# Patient Record
Sex: Female | Born: 1945
Health system: Southern US, Community
[De-identification: ages and names within clinical notes are randomized; demographics above are authoritative.]

## PROBLEM LIST (undated history)

## (undated) DIAGNOSIS — I10 Essential (primary) hypertension: Secondary | ICD-10-CM

## (undated) DIAGNOSIS — M1990 Unspecified osteoarthritis, unspecified site: Secondary | ICD-10-CM

## (undated) DIAGNOSIS — N6009 Solitary cyst of unspecified breast: Secondary | ICD-10-CM

## (undated) DIAGNOSIS — M169 Osteoarthritis of hip, unspecified: Secondary | ICD-10-CM

## (undated) DIAGNOSIS — M545 Low back pain, unspecified: Secondary | ICD-10-CM

## (undated) DIAGNOSIS — K219 Gastro-esophageal reflux disease without esophagitis: Secondary | ICD-10-CM

## (undated) DIAGNOSIS — K635 Polyp of colon: Secondary | ICD-10-CM

## (undated) HISTORY — PX: COLONOSCOPY: SHX174

## (undated) HISTORY — DX: Polyp of colon: K63.5

## (undated) HISTORY — DX: Unspecified osteoarthritis, unspecified site: M19.90

## (undated) HISTORY — PX: COLONOSCOPY W/ BIOPSIES AND POLYPECTOMY: SHX1376

## (undated) HISTORY — PX: POLYPECTOMY: SHX149

## (undated) HISTORY — DX: Solitary cyst of unspecified breast: N60.09

## (undated) HISTORY — DX: Osteoarthritis of hip, unspecified: M16.9

## (undated) HISTORY — DX: Essential (primary) hypertension: I10

## (undated) HISTORY — DX: Low back pain, unspecified: M54.50

## (undated) HISTORY — DX: Low back pain: M54.5

---

## 1998-10-19 ENCOUNTER — Other Ambulatory Visit: Admission: RE | Admit: 1998-10-19 | Discharge: 1998-10-19 | Payer: Self-pay | Admitting: Gynecology

## 1999-10-21 ENCOUNTER — Other Ambulatory Visit: Admission: RE | Admit: 1999-10-21 | Discharge: 1999-10-21 | Payer: Self-pay | Admitting: Gynecology

## 2000-08-10 ENCOUNTER — Encounter: Payer: Self-pay | Admitting: Internal Medicine

## 2000-08-10 ENCOUNTER — Ambulatory Visit (HOSPITAL_COMMUNITY): Admission: RE | Admit: 2000-08-10 | Discharge: 2000-08-10 | Payer: Self-pay | Admitting: Internal Medicine

## 2000-11-06 ENCOUNTER — Other Ambulatory Visit: Admission: RE | Admit: 2000-11-06 | Discharge: 2000-11-06 | Payer: Self-pay | Admitting: Gynecology

## 2001-11-13 ENCOUNTER — Other Ambulatory Visit: Admission: RE | Admit: 2001-11-13 | Discharge: 2001-11-13 | Payer: Self-pay | Admitting: Gynecology

## 2002-11-25 ENCOUNTER — Other Ambulatory Visit: Admission: RE | Admit: 2002-11-25 | Discharge: 2002-11-25 | Payer: Self-pay | Admitting: Gynecology

## 2003-12-09 ENCOUNTER — Other Ambulatory Visit: Admission: RE | Admit: 2003-12-09 | Discharge: 2003-12-09 | Payer: Self-pay | Admitting: Gynecology

## 2004-09-28 ENCOUNTER — Ambulatory Visit: Payer: Self-pay | Admitting: Internal Medicine

## 2004-10-05 ENCOUNTER — Ambulatory Visit: Payer: Self-pay | Admitting: Internal Medicine

## 2004-11-01 ENCOUNTER — Ambulatory Visit: Payer: Self-pay | Admitting: Gastroenterology

## 2004-11-15 ENCOUNTER — Ambulatory Visit: Payer: Self-pay | Admitting: Gastroenterology

## 2004-12-27 ENCOUNTER — Other Ambulatory Visit: Admission: RE | Admit: 2004-12-27 | Discharge: 2004-12-27 | Payer: Self-pay | Admitting: Gynecology

## 2005-10-10 ENCOUNTER — Ambulatory Visit: Payer: Self-pay | Admitting: Internal Medicine

## 2006-01-02 ENCOUNTER — Other Ambulatory Visit: Admission: RE | Admit: 2006-01-02 | Discharge: 2006-01-02 | Payer: Self-pay | Admitting: Gynecology

## 2006-09-26 DIAGNOSIS — N6009 Solitary cyst of unspecified breast: Secondary | ICD-10-CM

## 2006-09-26 HISTORY — DX: Solitary cyst of unspecified breast: N60.09

## 2006-10-09 ENCOUNTER — Ambulatory Visit: Payer: Self-pay | Admitting: Internal Medicine

## 2006-10-09 LAB — CONVERTED CEMR LAB
ALT: 31 units/L (ref 0–40)
AST: 26 units/L (ref 0–37)
Albumin: 4 g/dL (ref 3.5–5.2)
Alkaline Phosphatase: 62 units/L (ref 39–117)
BUN: 13 mg/dL (ref 6–23)
Basophils Absolute: 0.1 10*3/uL (ref 0.0–0.1)
Basophils Relative: 0.6 % (ref 0.0–1.0)
Bilirubin Urine: NEGATIVE
CO2: 30 meq/L (ref 19–32)
Calcium: 9.5 mg/dL (ref 8.4–10.5)
Chloride: 101 meq/L (ref 96–112)
Chol/HDL Ratio, serum: 4.6
Cholesterol: 172 mg/dL (ref 0–200)
Creatinine, Ser: 0.8 mg/dL (ref 0.4–1.2)
Eosinophil percent: 2.8 % (ref 0.0–5.0)
GFR calc non Af Amer: 78 mL/min
Glomerular Filtration Rate, Af Am: 94 mL/min/{1.73_m2}
Glucose, Bld: 104 mg/dL — ABNORMAL HIGH (ref 70–99)
HCT: 39.3 % (ref 36.0–46.0)
HDL: 37.2 mg/dL — ABNORMAL LOW (ref >39.0)
Hemoglobin, Urine: NEGATIVE
Hemoglobin: 13.4 g/dL (ref 12.0–15.0)
Ketones, ur: NEGATIVE mg/dL
LDL Cholesterol: 100 mg/dL — ABNORMAL HIGH (ref 0–99)
Leukocytes, UA: NEGATIVE
Lymphocytes Relative: 26.9 % (ref 12.0–46.0)
MCHC: 34.2 g/dL (ref 30.0–36.0)
MCV: 91.7 fL (ref 78.0–100.0)
Monocytes Absolute: 0.6 10*3/uL (ref 0.2–0.7)
Monocytes Relative: 7.7 % (ref 3.0–11.0)
Neutro Abs: 5.2 10*3/uL (ref 1.4–7.7)
Neutrophils Relative %: 62 % (ref 43.0–77.0)
Nitrite: NEGATIVE
Platelets: 248 10*3/uL (ref 150–400)
Potassium: 4.1 meq/L (ref 3.5–5.1)
RBC: 4.29 M/uL (ref 3.87–5.11)
RDW: 11.7 % (ref 11.5–14.6)
Sodium: 137 meq/L (ref 135–145)
Specific Gravity, Urine: 1.015 (ref 1.000–1.03)
TSH: 3.37 microintl units/mL (ref 0.35–5.50)
Total Bilirubin: 1 mg/dL (ref 0.3–1.2)
Total Protein, Urine: NEGATIVE mg/dL
Total Protein: 7 g/dL (ref 6.0–8.3)
Triglyceride fasting, serum: 176 mg/dL — ABNORMAL HIGH (ref 0–149)
Urine Glucose: NEGATIVE mg/dL
Urobilinogen, UA: 0.2 (ref 0.0–1.0)
VLDL: 35 mg/dL (ref 0–40)
WBC: 8.4 10*3/uL (ref 4.5–10.5)
pH: 7 (ref 5.0–8.0)

## 2006-10-16 ENCOUNTER — Ambulatory Visit: Payer: Self-pay | Admitting: Internal Medicine

## 2007-10-15 ENCOUNTER — Ambulatory Visit: Payer: Self-pay | Admitting: Endocrinology

## 2007-10-15 LAB — CONVERTED CEMR LAB
ALT: 30 units/L (ref 0–35)
AST: 27 units/L (ref 0–37)
Albumin: 4.3 g/dL (ref 3.5–5.2)
Alkaline Phosphatase: 57 units/L (ref 39–117)
BUN: 16 mg/dL (ref 6–23)
Bacteria, UA: NEGATIVE
Basophils Absolute: 0 10*3/uL (ref 0.0–0.1)
Basophils Relative: 0.5 % (ref 0.0–1.0)
Bilirubin Urine: NEGATIVE
Bilirubin, Direct: 0.2 mg/dL (ref 0.0–0.3)
CO2: 29 meq/L (ref 19–32)
Calcium: 9.7 mg/dL (ref 8.4–10.5)
Chloride: 98 meq/L (ref 96–112)
Cholesterol: 212 mg/dL (ref 0–200)
Creatinine, Ser: 0.8 mg/dL (ref 0.4–1.2)
Crystals: NEGATIVE
Direct LDL: 150.6 mg/dL
Eosinophils Absolute: 0.2 10*3/uL (ref 0.0–0.6)
Eosinophils Relative: 2.1 % (ref 0.0–5.0)
GFR calc Af Amer: 94 mL/min
GFR calc non Af Amer: 78 mL/min
Glucose, Bld: 109 mg/dL — ABNORMAL HIGH (ref 70–99)
HCT: 40.2 % (ref 36.0–46.0)
HDL: 45.5 mg/dL (ref >39.0)
Hemoglobin: 14.3 g/dL (ref 12.0–15.0)
Ketones, ur: NEGATIVE mg/dL
LDL Cholesterol: 141 mg/dL — ABNORMAL HIGH (ref 0–99)
Lymphocytes Relative: 23.2 % (ref 12.0–46.0)
MCHC: 35.5 g/dL (ref 30.0–36.0)
MCV: 90 fL (ref 78.0–100.0)
Monocytes Absolute: 0.7 10*3/uL (ref 0.2–0.7)
Monocytes Relative: 8.3 % (ref 3.0–11.0)
Mucus, UA: NEGATIVE
Neutro Abs: 5.4 10*3/uL (ref 1.4–7.7)
Neutrophils Relative %: 65.9 % (ref 43.0–77.0)
Nitrite: NEGATIVE
Platelets: 262 10*3/uL (ref 150–400)
Potassium: 3.7 meq/L (ref 3.5–5.1)
RBC / HPF: NONE SEEN
RBC: 4.47 M/uL (ref 3.87–5.11)
RDW: 11.9 % (ref 11.5–14.6)
Sodium: 136 meq/L (ref 135–145)
Specific Gravity, Urine: 1.02 (ref 1.000–1.03)
TSH: 3.83 microintl units/mL (ref 0.35–5.50)
Total Bilirubin: 1.1 mg/dL (ref 0.3–1.2)
Total CHOL/HDL Ratio: 4.7
Total Protein, Urine: NEGATIVE mg/dL
Total Protein: 7.7 g/dL (ref 6.0–8.3)
Triglycerides: 126 mg/dL (ref 0–149)
Urine Glucose: NEGATIVE mg/dL
Urobilinogen, UA: 0.2 (ref 0.0–1.0)
VLDL: 25 mg/dL (ref 0–40)
WBC: 8.2 10*3/uL (ref 4.5–10.5)
pH: 7.5 (ref 5.0–8.0)

## 2007-10-24 ENCOUNTER — Ambulatory Visit: Payer: Self-pay | Admitting: Internal Medicine

## 2007-10-24 DIAGNOSIS — M79609 Pain in unspecified limb: Secondary | ICD-10-CM | POA: Insufficient documentation

## 2007-10-24 DIAGNOSIS — Z860100 Personal history of colon polyps, unspecified: Secondary | ICD-10-CM | POA: Insufficient documentation

## 2007-10-24 DIAGNOSIS — I10 Essential (primary) hypertension: Secondary | ICD-10-CM | POA: Insufficient documentation

## 2007-10-24 DIAGNOSIS — N6009 Solitary cyst of unspecified breast: Secondary | ICD-10-CM

## 2008-08-13 ENCOUNTER — Encounter: Payer: Self-pay | Admitting: Internal Medicine

## 2008-08-27 ENCOUNTER — Encounter: Admission: RE | Admit: 2008-08-27 | Discharge: 2008-08-27 | Payer: Self-pay | Admitting: Gynecology

## 2008-10-17 ENCOUNTER — Ambulatory Visit: Payer: Self-pay | Admitting: Internal Medicine

## 2008-10-22 LAB — CONVERTED CEMR LAB
ALT: 31 units/L (ref 0–35)
AST: 30 units/L (ref 0–37)
Albumin: 4 g/dL (ref 3.5–5.2)
Alkaline Phosphatase: 66 units/L (ref 39–117)
BUN: 17 mg/dL (ref 6–23)
Basophils Absolute: 0 10*3/uL (ref 0.0–0.1)
Basophils Relative: 0 % (ref 0.0–3.0)
Bilirubin Urine: NEGATIVE
Bilirubin, Direct: 0.2 mg/dL (ref 0.0–0.3)
CO2: 31 meq/L (ref 19–32)
Calcium: 9.6 mg/dL (ref 8.4–10.5)
Chloride: 99 meq/L (ref 96–112)
Cholesterol: 165 mg/dL (ref 0–200)
Creatinine, Ser: 0.7 mg/dL (ref 0.4–1.2)
Crystals: NEGATIVE
Eosinophils Absolute: 0.2 10*3/uL (ref 0.0–0.7)
Eosinophils Relative: 3.2 % (ref 0.0–5.0)
GFR calc Af Amer: 109 mL/min
GFR calc non Af Amer: 90 mL/min
Glucose, Bld: 103 mg/dL — ABNORMAL HIGH (ref 70–99)
HCT: 39.4 % (ref 36.0–46.0)
HDL: 38.1 mg/dL — ABNORMAL LOW (ref >39.0)
Hemoglobin, Urine: NEGATIVE
Hemoglobin: 13.5 g/dL (ref 12.0–15.0)
Ketones, ur: NEGATIVE mg/dL
LDL Cholesterol: 114 mg/dL — ABNORMAL HIGH (ref 0–99)
Lymphocytes Relative: 26.7 % (ref 12.0–46.0)
MCHC: 34.4 g/dL (ref 30.0–36.0)
MCV: 91.8 fL (ref 78.0–100.0)
Monocytes Absolute: 0.8 10*3/uL (ref 0.1–1.0)
Monocytes Relative: 14.8 % — ABNORMAL HIGH (ref 3.0–12.0)
Mucus, UA: NEGATIVE
Neutro Abs: 3.1 10*3/uL (ref 1.4–7.7)
Neutrophils Relative %: 55.3 % (ref 43.0–77.0)
Nitrite: NEGATIVE
Platelets: 218 10*3/uL (ref 150–400)
Potassium: 3.5 meq/L (ref 3.5–5.1)
RBC / HPF: NONE SEEN
RBC: 4.29 M/uL (ref 3.87–5.11)
RDW: 11.5 % (ref 11.5–14.6)
Sodium: 137 meq/L (ref 135–145)
Specific Gravity, Urine: 1.01 (ref 1.000–1.03)
TSH: 2.37 microintl units/mL (ref 0.35–5.50)
Total Bilirubin: 1.1 mg/dL (ref 0.3–1.2)
Total CHOL/HDL Ratio: 4.3
Total Protein, Urine: NEGATIVE mg/dL
Total Protein: 7.2 g/dL (ref 6.0–8.3)
Triglycerides: 64 mg/dL (ref 0–149)
Urine Glucose: NEGATIVE mg/dL
Urobilinogen, UA: 0.2 (ref 0.0–1.0)
VLDL: 13 mg/dL (ref 0–40)
WBC: 5.6 10*3/uL (ref 4.5–10.5)
pH: 6.5 (ref 5.0–8.0)

## 2008-10-24 ENCOUNTER — Ambulatory Visit: Payer: Self-pay | Admitting: Internal Medicine

## 2008-10-24 DIAGNOSIS — K5900 Constipation, unspecified: Secondary | ICD-10-CM | POA: Insufficient documentation

## 2008-10-24 DIAGNOSIS — F329 Major depressive disorder, single episode, unspecified: Secondary | ICD-10-CM

## 2008-10-24 DIAGNOSIS — R1031 Right lower quadrant pain: Secondary | ICD-10-CM | POA: Insufficient documentation

## 2009-09-26 DIAGNOSIS — M169 Osteoarthritis of hip, unspecified: Secondary | ICD-10-CM

## 2009-09-26 HISTORY — DX: Osteoarthritis of hip, unspecified: M16.9

## 2009-10-20 ENCOUNTER — Ambulatory Visit: Payer: Self-pay | Admitting: Internal Medicine

## 2009-10-20 LAB — CONVERTED CEMR LAB
ALT: 25 units/L (ref 0–35)
AST: 26 units/L (ref 0–37)
Alkaline Phosphatase: 60 units/L (ref 39–117)
Bilirubin Urine: NEGATIVE
Bilirubin, Direct: 0.1 mg/dL (ref 0.0–0.3)
CO2: 30 meq/L (ref 19–32)
Calcium: 9.8 mg/dL (ref 8.4–10.5)
Creatinine, Ser: 0.7 mg/dL (ref 0.4–1.2)
Eosinophils Relative: 3.9 % (ref 0.0–5.0)
Glucose, Bld: 78 mg/dL (ref 70–99)
HCT: 38.8 % (ref 36.0–46.0)
HDL: 41.1 mg/dL (ref >39.00)
Ketones, ur: NEGATIVE mg/dL
Lymphs Abs: 2.2 10*3/uL (ref 0.7–4.0)
Monocytes Relative: 9.3 % (ref 3.0–12.0)
Platelets: 235 10*3/uL (ref 150.0–400.0)
Sodium: 140 meq/L (ref 135–145)
Total Bilirubin: 0.8 mg/dL (ref 0.3–1.2)
Total CHOL/HDL Ratio: 4
Total Protein, Urine: NEGATIVE mg/dL
Urine Glucose: NEGATIVE mg/dL
WBC: 6.9 10*3/uL (ref 4.5–10.5)
pH: 7.5 (ref 5.0–8.0)

## 2009-10-28 ENCOUNTER — Ambulatory Visit: Payer: Self-pay | Admitting: Internal Medicine

## 2009-10-30 ENCOUNTER — Encounter (INDEPENDENT_AMBULATORY_CARE_PROVIDER_SITE_OTHER): Payer: Self-pay | Admitting: *Deleted

## 2009-11-02 ENCOUNTER — Ambulatory Visit: Payer: Self-pay | Admitting: Gastroenterology

## 2009-11-16 ENCOUNTER — Ambulatory Visit: Payer: Self-pay | Admitting: Gastroenterology

## 2009-11-16 LAB — HM COLONOSCOPY

## 2010-02-05 ENCOUNTER — Ambulatory Visit: Payer: Self-pay | Admitting: Internal Medicine

## 2010-02-05 ENCOUNTER — Telehealth: Payer: Self-pay | Admitting: Internal Medicine

## 2010-02-05 DIAGNOSIS — M545 Low back pain: Secondary | ICD-10-CM

## 2010-02-25 ENCOUNTER — Telehealth: Payer: Self-pay | Admitting: Internal Medicine

## 2010-04-15 ENCOUNTER — Ambulatory Visit: Payer: Self-pay | Admitting: Internal Medicine

## 2010-04-15 LAB — CONVERTED CEMR LAB
Chloride: 102 meq/L (ref 96–112)
GFR calc non Af Amer: 105.06 mL/min (ref >60)
Potassium: 4.6 meq/L (ref 3.5–5.1)

## 2010-04-21 ENCOUNTER — Ambulatory Visit: Payer: Self-pay | Admitting: Internal Medicine

## 2010-04-21 DIAGNOSIS — J069 Acute upper respiratory infection, unspecified: Secondary | ICD-10-CM | POA: Insufficient documentation

## 2010-04-21 DIAGNOSIS — M25559 Pain in unspecified hip: Secondary | ICD-10-CM

## 2010-06-15 ENCOUNTER — Ambulatory Visit: Payer: Self-pay | Admitting: Internal Medicine

## 2010-06-15 ENCOUNTER — Telehealth (INDEPENDENT_AMBULATORY_CARE_PROVIDER_SITE_OTHER): Payer: Self-pay | Admitting: *Deleted

## 2010-06-16 ENCOUNTER — Encounter: Payer: Self-pay | Admitting: Internal Medicine

## 2010-06-16 ENCOUNTER — Ambulatory Visit: Payer: Self-pay

## 2010-06-25 ENCOUNTER — Telehealth: Payer: Self-pay | Admitting: Internal Medicine

## 2010-07-01 ENCOUNTER — Ambulatory Visit: Payer: Self-pay | Admitting: Sports Medicine

## 2010-07-01 ENCOUNTER — Encounter: Admission: RE | Admit: 2010-07-01 | Discharge: 2010-07-01 | Payer: Self-pay | Admitting: Sports Medicine

## 2010-07-01 DIAGNOSIS — M169 Osteoarthritis of hip, unspecified: Secondary | ICD-10-CM

## 2010-08-03 ENCOUNTER — Telehealth (INDEPENDENT_AMBULATORY_CARE_PROVIDER_SITE_OTHER): Payer: Self-pay | Admitting: *Deleted

## 2010-08-18 ENCOUNTER — Ambulatory Visit: Payer: Self-pay | Admitting: Internal Medicine

## 2010-08-18 ENCOUNTER — Ambulatory Visit: Payer: Self-pay | Admitting: Family Medicine

## 2010-08-18 DIAGNOSIS — M1990 Unspecified osteoarthritis, unspecified site: Secondary | ICD-10-CM

## 2010-08-27 ENCOUNTER — Encounter: Payer: Self-pay | Admitting: Internal Medicine

## 2010-09-26 DIAGNOSIS — Z96641 Presence of right artificial hip joint: Secondary | ICD-10-CM | POA: Insufficient documentation

## 2010-10-11 ENCOUNTER — Telehealth (INDEPENDENT_AMBULATORY_CARE_PROVIDER_SITE_OTHER): Payer: Self-pay | Admitting: *Deleted

## 2010-10-26 NOTE — Assessment & Plan Note (Signed)
Summary: 2 mo rov /nws  #   Vital Signs:  Patient profile:   65 year old female Height:      61 inches Weight:      154 pounds BMI:     29.20 Temp:     98.3 degrees F oral Pulse rate:   96 / minute Pulse rhythm:   regular Resp:     16 per minute BP sitting:   148 / 82  (left arm) Cuff size:   regular  Vitals Entered By: Lanier Prude, CMA(AAMA) (August 18, 2010 8:35 AM) CC: 2 mo f/u  Is Patient Diabetic? No Comments pt is not taking Amlodipine or Prednisone. Pt needs handicap placard completed   CC:  2 mo f/u .  History of Present Illness: The patient presents for a follow up of hypertension,OA, hyperlipidemia F/u R hip pain   Current Medications (verified): 1)  Amlodipine Besylate 5 Mg Tabs (Amlodipine Besylate) .Marland Kitchen.. 1 By Mouth Qd 2)  Maxzide-25 37.5-25 Mg Tabs (Triamterene-Hctz) .Marland Kitchen.. 1 By Mouth Qd 3)  Benazepril Hcl 40 Mg Tabs (Benazepril Hcl) .Marland Kitchen.. 1 By Mouth Qd 4)  Vitamin D3 1000 Unit  Tabs (Cholecalciferol) .Marland Kitchen.. 1 By Mouth T, Th, Sat 5)  Fish Oil   Oil (Fish Oil) .Marland Kitchen.. 1 By Mouth Two Times A Day 6)  Centrum Silver   Tabs (Multiple Vitamins-Minerals) .... Take 1 Tab By Mouth Every Other Day 7)  Aspir-Low 81 Mg Tbec (Aspirin) .Marland Kitchen.. 1 Once Daily After Meal 8)  Miralax   Powd (Polyethylene Glycol 3350) .Marland Kitchen.. 17g By Mouth Once Daily As Needed Constipation 9)  Tylenol Extra Strength 500 Mg Tabs (Acetaminophen) .... 2 By Mouth Once Daily As Needed 10)  Metamucil 30.9 % Powd (Psyllium) .... Daily As Directed 11)  Tramadol Hcl 50 Mg Tabs (Tramadol Hcl) .Marland Kitchen.. 1-2 Tabs By Mouth Two Times A Day As Needed Pain 12)  Naproxen Sodium 220 Mg Tabs (Naproxen Sodium) .... 2 Two Times A Day 13)  Vitamin C .... 1 By Mouth Every Other Day 14)  Calcium 600mg  .... 1 By Mouth Two Times A Day 15)  Magnesium 200 Mg .Marland Kitchen.. 1 By Mouth Two Times A Day 16)  Ra Turmeric 500 Mg Caps (Turmeric) .Marland Kitchen.. 1 By Mouth Two Times A Day 17)  Ibuprofen 200 Mg Tabs (Ibuprofen) .... Two Times A Day To Four Times A  Day As Needed  Allergies (verified): No Known Drug Allergies  Past History:  Social History: Last updated: 10/24/2007 Occupation: running a day Care Married Never Smoked  Past Medical History: Hypertension Colonic polyps, hx of  Dr Russella Dar L breast cyst 2008 SER Gyn Dr Huel [place] Constipation Low back pain ? spinal stenosis Osteoarthritis R hip severe OA 2011 Dr Darrick Penna  Review of Systems       The patient complains of difficulty walking.  The patient denies weight loss, weight gain, dyspnea on exertion, abdominal pain, and severe indigestion/heartburn.    Physical Exam  General:  Well-developed,well-nourished,in no acute distress; alert,appropriate and cooperative throughout examination Nose:  External nasal examination shows no deformity or inflammation. Nasal mucosa are pink and moist without lesions or exudates. Mouth:  good dentition.   Neck:  No deformities, masses, or tenderness noted. Lungs:  Normal respiratory effort, chest expands symmetrically. Lungs are clear to auscultation, no crackles or wheezes. Heart:  Normal rate and regular rhythm. S1 and S2 normal without gallop, murmur, click, rub or other extra sounds. Abdomen:  Bowel sounds positive,abdomen soft and non-tender without masses, organomegaly  or hernias noted. NT Msk:  Hips: Internal and external rotation on L side normal, with limited internal rotation in particular on R side. Deficit of 10-15 degrees. Rull ROM with flexion and extension at hip bilaterally.  Weak hip flexion bilaterally (4+/5) but L stronger than R. Unable to do FABER on R, nml FABER on L. 4/5 hip abduction and adduction on R. Neurologic:  Gait: Patient takes small steps, favoring L side. Externally rotates R foot. Skin:  Intact without suspicious lesions or rashes Psych:  Cognition and judgment appear intact. Alert and cooperative with normal attention span and concentration. No apparent delusions, illusions, hallucinations   Impression &  Recommendations:  Problem # 1:  OSTEOARTHRITIS (ICD-715.90) Assessment Unchanged  The following medications were removed from the medication list:    Naproxen Sodium 220 Mg Tabs (Naproxen sodium) .Marland Kitchen... 2 two times a day Her updated medication list for this problem includes:    Aspir-low 81 Mg Tbec (Aspirin) .Marland Kitchen... 1 once daily after meal    Tylenol Extra Strength 500 Mg Tabs (Acetaminophen) .Marland Kitchen... 2 by mouth once daily as needed    Tramadol Hcl 50 Mg Tabs (Tramadol hcl) .Marland Kitchen... 1-2 tabs by mouth two times a day as needed pain    Ibuprofen 200 Mg Tabs (Ibuprofen) .Marland Kitchen..Marland Kitchen Two times a day to four times a day as needed  Orders: T-Bone Densitometry (09811)  Problem # 2:  HIP PAIN (ICD-719.45) R Assessment: Improved  The following medications were removed from the medication list:    Naproxen Sodium 220 Mg Tabs (Naproxen sodium) .Marland Kitchen... 2 two times a day Her updated medication list for this problem includes:    Aspir-low 81 Mg Tbec (Aspirin) .Marland Kitchen... 1 once daily after meal    Tylenol Extra Strength 500 Mg Tabs (Acetaminophen) .Marland Kitchen... 2 by mouth once daily as needed    Tramadol Hcl 50 Mg Tabs (Tramadol hcl) .Marland Kitchen... 1-2 tabs by mouth two times a day as needed pain    Ibuprofen 200 Mg Tabs (Ibuprofen) .Marland Kitchen..Marland Kitchen Two times a day to four times a day as needed  Complete Medication List: 1)  Amlodipine Besylate 5 Mg Tabs (Amlodipine besylate) .Marland Kitchen.. 1 by mouth qd 2)  Maxzide-25 37.5-25 Mg Tabs (Triamterene-hctz) .Marland Kitchen.. 1 by mouth qd 3)  Benazepril Hcl 40 Mg Tabs (Benazepril hcl) .Marland Kitchen.. 1 by mouth qd 4)  Vitamin D3 1000 Unit Tabs (Cholecalciferol) .Marland Kitchen.. 1 by mouth t, th, sat 5)  Fish Oil Oil (Fish oil) .Marland Kitchen.. 1 by mouth two times a day 6)  Centrum Silver Tabs (Multiple vitamins-minerals) .... Take 1 tab by mouth every other day 7)  Aspir-low 81 Mg Tbec (Aspirin) .Marland Kitchen.. 1 once daily after meal 8)  Miralax Powd (Polyethylene glycol 3350) .Marland Kitchen.. 17g by mouth once daily as needed constipation 9)  Tylenol Extra Strength 500 Mg  Tabs (Acetaminophen) .... 2 by mouth once daily as needed 10)  Metamucil 30.9 % Powd (Psyllium) .... Daily as directed 11)  Tramadol Hcl 50 Mg Tabs (Tramadol hcl) .Marland Kitchen.. 1-2 tabs by mouth two times a day as needed pain 12)  Vitamin C  .... 1 by mouth every other day 13)  Calcium 600mg   .... 1 by mouth two times a day 14)  Magnesium 200 Mg  .Marland KitchenMarland Kitchen. 1 by mouth two times a day 15)  Ra Turmeric 500 Mg Caps (Turmeric) .Marland Kitchen.. 1 by mouth two times a day 16)  Ibuprofen 200 Mg Tabs (Ibuprofen) .... Two times a day to four times a day as needed  Patient  Instructions: 1)  Please schedule a follow-up appointment in 3 months. 2)  BMP prior to visit, ICD-9: 3)  Hepatic Panel prior to visit, ICD-9:995.20 401.1 4)  Loose wt   Orders Added: 1)  T-Bone Densitometry [77080] 2)  Est. Patient Level IV [16109]

## 2010-10-26 NOTE — Assessment & Plan Note (Signed)
Summary: 2 MO ROV /NWS  #   Vital Signs:  Patient profile:   65 year old female Height:      60 inches Weight:      155 pounds BMI:     30.38 O2 Sat:      96 % on Room air Temp:     98.8 degrees F oral Pulse rate:   98 / minute Pulse rhythm:   regular Resp:     16 per minute BP sitting:   140 / 90  (left arm) Cuff size:   regular  Vitals Entered By: Lanier Prude, CMA(AAMA) (April 21, 2010 2:49 PM)  O2 Flow:  Room air CC: 2 mo f/u Is Patient Diabetic? No Comments pt is usong [employer] brand Naproxen 440mg    CC:  2 mo f/u.  History of Present Illness: The patient presents for a follow up of hypertension, leg pain -and LBP - not better The patient presents with complaints of sore throat, fever, cough, sinus congestion and drainge of several days duration. Not better with OTC meds.   The mucus is colored.   Current Medications (verified): 1)  Vitamin D3 1000 Unit  Tabs (Cholecalciferol) .Marland Kitchen.. 1 By Mouth T, Th, Sat 2)  Fish Oil   Oil (Fish Oil) .Marland Kitchen.. 1 By Mouth Bid 3)  Centrum Silver   Tabs (Multiple Vitamins-Minerals) .... Take 1 Tab By Mouth Every Other Day 4)  Aspir-Low 81 Mg Tbec (Aspirin) .Marland Kitchen.. 1 Once Daily After Meal 5)  Miralax   Powd (Polyethylene Glycol 3350) .Marland Kitchen.. 17g By Mouth Once Daily As Needed Constipation 6)  Vitamin C .... 1 By Mouth Qod 7)  Calcium 600mg  .... 1 By Mouth Bid 8)  Magnesium 200 Mg .Marland Kitchen.. 1 By Mouth Bid 9)  Tylenol Extra Strength 500 Mg Tabs (Acetaminophen) .... Prn 10)  Metamucil 30.9 % Powd (Psyllium) .... Daily As Directed 11)  Tramadol Hcl 50 Mg Tabs (Tramadol Hcl) .Marland Kitchen.. 1-2 Tabs By Mouth Two Times A Day As Needed Pain 12)  Benazepril Hcl 40 Mg Tabs (Benazepril Hcl) .Marland Kitchen.. 1 By Mouth Qd 13)  Maxzide-25 37.5-25 Mg Tabs (Triamterene-Hctz) .Marland Kitchen.. 1 By Mouth Qd  Allergies (verified): No Known Drug Allergies  Past History:  Past Medical History: Last updated: 02/05/2010 Hypertension Colonic polyps, hx of  Dr Russella Dar L breast cyst 2008 SER Gyn Dr  Huel [facility] Constipation Low back pain ? spinal stenosis  Social History: Last updated: 10/24/2007 Occupation: running a day Care Married Never Smoked  Review of Systems  The patient denies fever, syncope, and abdominal pain.    Physical Exam  General:  overweight-appearing.   Nose:  External nasal examination shows no deformity or inflammation. Nasal mucosa are pink and moist without lesions or exudates. Mouth:  good dentition.   Neck:  No deformities, masses, or tenderness noted. Lungs:  Normal respiratory effort, chest expands symmetrically. Lungs are clear to auscultation, no crackles or wheezes. Heart:  Normal rate and regular rhythm. S1 and S2 normal without gallop, murmur, click, rub or other extra sounds. Abdomen:  Bowel sounds positive,abdomen soft and non-tender without masses, organomegaly or hernias noted. NT Msk:  No deformity or scoliosis noted of thoracic or lumbar spine.  Str. legg elev neg B  R ileopsoas tender in groin Extremities:  No clubbing, cyanosis, edema, or deformity noted with normal full range of motion of all joints.   Neurologic:  No cranial nerve deficits noted. Station and gait are normal. Plantar reflexes are down-going bilaterally. DTRs are symmetrical throughout.  Sensory, motor and coordinative functions appear intact. Skin:  Intact without suspicious lesions or rashes Psych:  Cognition and judgment appear intact. Alert and cooperative with normal attention span and concentration. No apparent delusions, illusions, hallucinations   Impression & Recommendations:  Problem # 1:  HYPERTENSION (ICD-401.9) Assessment Improved  Her updated medication list for this problem includes:    Benazepril Hcl 40 Mg Tabs (Benazepril hcl) .Marland Kitchen... 1 by mouth qd    Maxzide-25 37.5-25 Mg Tabs (Triamterene-hctz) .Marland Kitchen... 1 by mouth qd    Amlodipine Besylate 5 Mg Tabs (Amlodipine besylate) .Marland Kitchen... 1 by mouth qd  BP today: 140/90 Prior BP: 160/82 (02/05/2010)  Labs  Reviewed: K+: 4.6 (04/15/2010) Creat: : 0.6 (04/15/2010)   Chol: 182 (10/20/2009)   HDL: 41.10 (10/20/2009)   LDL: 105 (10/20/2009)   TG: 182.0 (10/20/2009)  Problem # 2:  HIP PAIN (ICD-719.45) R - ileo-psoas tendonitis Assessment: Unchanged Sports med, PT consults offered Her updated medication list for this problem includes:    Aspir-low 81 Mg Tbec (Aspirin) .Marland Kitchen... 1 once daily after meal    Tylenol Extra Strength 500 Mg Tabs (Acetaminophen) .Marland Kitchen... Prn    Tramadol Hcl 50 Mg Tabs (Tramadol hcl) .Marland Kitchen... 1-2 tabs by mouth two times a day as needed pain  Problem # 3:  RLQ PAIN (ICD-789.03)/ groin related to #2 Assessment: Unchanged  Problem # 4:  UPPER RESPIRATORY INFECTION, ACUTE (ICD-465.9) Assessment: New Zpac if worse Her updated medication list for this problem includes:    Aspir-low 81 Mg Tbec (Aspirin) .Marland Kitchen... 1 once daily after meal    Tylenol Extra Strength 500 Mg Tabs (Acetaminophen) .Marland Kitchen... Prn  Complete Medication List: 1)  Vitamin D3 1000 Unit Tabs (Cholecalciferol) .Marland Kitchen.. 1 by mouth t, th, sat 2)  Fish Oil Oil (Fish oil) .Marland Kitchen.. 1 by mouth bid 3)  Centrum Silver Tabs (Multiple vitamins-minerals) .... Take 1 tab by mouth every other day 4)  Aspir-low 81 Mg Tbec (Aspirin) .Marland Kitchen.. 1 once daily after meal 5)  Miralax Powd (Polyethylene glycol 3350) .Marland Kitchen.. 17g by mouth once daily as needed constipation 6)  Vitamin C  .... 1 by mouth qod 7)  Calcium 600mg   .... 1 by mouth bid 8)  Magnesium 200 Mg  .Marland KitchenMarland Kitchen. 1 by mouth bid 9)  Tylenol Extra Strength 500 Mg Tabs (Acetaminophen) .... Prn 10)  Metamucil 30.9 % Powd (Psyllium) .... Daily as directed 11)  Tramadol Hcl 50 Mg Tabs (Tramadol hcl) .Marland Kitchen.. 1-2 tabs by mouth two times a day as needed pain 12)  Benazepril Hcl 40 Mg Tabs (Benazepril hcl) .Marland Kitchen.. 1 by mouth qd 13)  Maxzide-25 37.5-25 Mg Tabs (Triamterene-hctz) .Marland Kitchen.. 1 by mouth qd 14)  Amlodipine Besylate 5 Mg Tabs (Amlodipine besylate) .Marland Kitchen.. 1 by mouth qd 15)  Prednisone 10 Mg Tabs (Prednisone) ....  Take 40mg  qd for 3 days, then 20 mg qd for 3 days, then 10mg  qd for 6 days, then stop. take pc. 16)  Zithromax Z-pak 250 Mg Tabs (Azithromycin) .... As dirrected  Patient Instructions: 1)  Right ileopsoas tendonitis 2)  Use stretching and balance exercises that I have provided (15 min. or longer every day)  3)  Please schedule a follow-up appointment in 3 months. Prescriptions: ZITHROMAX Z-PAK 250 MG TABS (AZITHROMYCIN) as dirrected  #1 x 0   Entered and Authorized by:   Tresa Garter MD   Signed by:   Tresa Garter MD on 04/21/2010   Method used:   Print then Give to Patient   RxID:  [id number] PREDNISONE 10 MG TABS (PREDNISONE) Take 40mg  qd for 3 days, then 20 mg qd for 3 days, then 10mg  qd for 6 days, then stop. Take pc.  #24 x 1   Entered and Authorized by:   Tresa Garter MD   Signed by:   Tresa Garter MD on 04/21/2010   Method used:   Print then Give to Patient   RxID:   [id number] AMLODIPINE BESYLATE 5 MG TABS (AMLODIPINE BESYLATE) 1 by mouth qd  #30 x 12   Entered and Authorized by:   Tresa Garter MD   Signed by:   Tresa Garter MD on 04/21/2010   Method used:   Print then Give to Patient   RxID:   [id number]

## 2010-10-26 NOTE — Progress Notes (Signed)
  Phone Note Call from Patient   Caller: Patient Summary of Call: Pt called asking how she should alternate ibuprofen and tylenol.  States she is taking 2 aspirin 3 times per day right now, and this is helpful, but wants to switch to ibuprofen.  She is afraid to take tramadol that was rxd by PCP because she read med insert and is afraid of becoming addicted.  Advised tramadol is non narcotic and is generally not addictive.  Discussed with Dr. Darrick Penna- he advised patient may continue taking aspirin if this helps pain, and take tylenol in between.  Advised she should take which ever ie ibuprofen or aspirin- is most helpful for arthritis pain.  Pt agreeable.  She also asked about glucosamine- advised per Dr. Darrick Penna this is helpful for some people.  He advised she should try for 6 weeks before determining if this will be helpful for her.  Advised patient of the above message. Initial call taken by: Rochele Pages RN,  August 03, 2010 4:46 PM

## 2010-10-26 NOTE — Progress Notes (Signed)
----   Converted from flag ---- ---- 06/15/2010 12:53 PM, Edman Circle wrote: appt tomorrow @ 11:00  ---- 06/15/2010 12:02 PM, Dagoberto Reef wrote: Thanks  ---- 06/15/2010 9:31 AM, Georgina Quint Plotnikov MD wrote: The following orders have been entered for this patient and placed on Admin Hold:  Type:     Referral       Code:   Doppler Description:   Doppler Referral Order Date:   06/15/2010   Authorized By:   Tresa Garter MD Order #:   [id number] Clinical Notes:   LE art doppler US Dx B leg pain r/o PVD late pm appt if possible   Type:     Referral       Code:   Sports Med Description:   Sports Medicine Order Date:   06/15/2010   Authorized By:   Tresa Garter MD Order #:   [id number] Clinical Notes:   Dr Darrick Penna Dx B groin pain R>>L, chronic late pm appt if poss ------------------------------

## 2010-10-26 NOTE — Assessment & Plan Note (Signed)
Summary: PHYSICAL---STC   Vital Signs:  Patient profile:   65 year old female Height:      60 inches Weight:      153 pounds BMI:     29.99 Temp:     98.5 degrees F oral Pulse rate:   97 / minute BP sitting:   134 / 84  (left arm)  Vitals Entered By: Tora Perches (October 28, 2009 3:36 PM) CC: cpx Is Patient Diabetic? No   CC:  cpx.  History of Present Illness: The patient presents for a wellness examination   Preventive Screening-Counseling & Management  Alcohol-Tobacco     Smoking Status: never  Current Medications (verified): 1)  Benazepril-Hydrochlorothiazide 20-25 Mg  Tabs (Benazepril-Hydrochlorothiazide) .Marland Kitchen.. 1 By Mouth Qd 2)  Calan Sr 120 Mg  Tbcr (Verapamil Hcl) .Marland Kitchen.. 1 By Mouth Qd 3)  Vitamin D3 1000 Unit  Tabs (Cholecalciferol) .Marland Kitchen.. 1 Qd 4)  Fish Oil   Oil (Fish Oil) .Marland Kitchen.. 1 By Mouth Qd 5)  Centrum Silver   Tabs (Multiple Vitamins-Minerals) .... Take 1 Tab By Mouth Every Other Day 6)  Aspir-Low 81 Mg Tbec (Aspirin) .Marland Kitchen.. 1 Once Daily After Meal 7)  Miralax   Powd (Polyethylene Glycol 3350) .Marland Kitchen.. 17g By Mouth Once Daily As Needed Constipation  Allergies (verified): No Known Drug Allergies  Past History:  Past Medical History: Last updated: 10/24/2008 Hypertension Colonic polyps, hx of  Dr Russella Dar L breast cyst 2008 SER  Gyn Dr Huel [facility]   Constipation  Past Surgical History: Last updated: 10/24/2008 Denies surgical history  Family History: Last updated: 10/24/2007 Family History Hypertension Family History Breast cancer 1st degree relative  65 yo  Social History: Last updated: 10/24/2007 Occupation: running a day Care Married Never Smoked  Review of Systems  The patient denies anorexia, fever, weight loss, weight gain, vision loss, decreased hearing, hoarseness, chest pain, syncope, dyspnea on exertion, peripheral edema, prolonged cough, headaches, hemoptysis, abdominal pain, melena, hematochezia, severe indigestion/heartburn, hematuria,  incontinence, genital sores, muscle weakness, suspicious skin lesions, transient blindness, difficulty walking, depression, unusual weight change, abnormal bleeding, enlarged lymph nodes, angioedema, and breast masses.    Physical Exam  General:  overweight-appearing.   Eyes:  No corneal or conjunctival inflammation noted. EOMI. Perrla.  Ears:  R ear normal and L ear normal.   Nose:  External nasal examination shows no deformity or inflammation. Nasal mucosa are pink and moist without lesions or exudates. Mouth:  good dentition.   Neck:  No deformities, masses, or tenderness noted. Lungs:  Normal respiratory effort, chest expands symmetrically. Lungs are clear to auscultation, no crackles or wheezes. Heart:  Normal rate and regular rhythm. S1 and S2 normal without gallop, murmur, click, rub or other extra sounds. Abdomen:  Bowel sounds positive,abdomen soft and non-tender without masses, organomegaly or hernias noted. Msk:  No deformity or scoliosis noted of thoracic or lumbar spine.   Pulses:  R and L carotid,radial,femoral,dorsalis pedis and posterior tibial pulses are full and equal bilaterally Extremities:  No clubbing, cyanosis, edema, or deformity noted with normal full range of motion of all joints.   Neurologic:  No cranial nerve deficits noted. Station and gait are normal. Plantar reflexes are down-going bilaterally. DTRs are symmetrical throughout. Sensory, motor and coordinative functions appear intact. Skin:  Intact without suspicious lesions or rashes Cervical Nodes:  No lymphadenopathy noted Psych:  Cognition and judgment appear intact. Alert and cooperative with normal attention span and concentration. No apparent delusions, illusions, hallucinations   Impression & Recommendations:  Problem # 1:  ROUTINE GENERAL MEDICAL EXAM@HEALTH  CARE FACL (ICD-V70.0) Assessment Comment Only Refused all shots incl. Zostavax Colonoscopy is pending  The labs were reviewed with the patient.    Health and age related issues were discussed. Available screening tests and vaccinations were discussed as well. Healthy life style including good diet and execise was discussed.  Orders: EKG w/ Interpretation (93000)  Problem # 2:  HYPERTENSION (ICD-401.9) Assessment: Comment Only  Her updated medication list for this problem includes:    Benazepril-hydrochlorothiazide 20-25 Mg Tabs (Benazepril-hydrochlorothiazide) .Marland Kitchen... 1 by mouth qd    Calan Sr 120 Mg Tbcr (Verapamil hcl) .Marland Kitchen... 1 by mouth qd  Complete Medication List: 1)  Benazepril-hydrochlorothiazide 20-25 Mg Tabs (Benazepril-hydrochlorothiazide) .Marland Kitchen.. 1 by mouth qd 2)  Calan Sr 120 Mg Tbcr (Verapamil hcl) .Marland Kitchen.. 1 by mouth qd 3)  Vitamin D3 1000 Unit Tabs (Cholecalciferol) .Marland Kitchen.. 1 qd 4)  Fish Oil Oil (Fish oil) .Marland Kitchen.. 1 by mouth qd 5)  Centrum Silver Tabs (Multiple vitamins-minerals) .... Take 1 tab by mouth every other day 6)  Aspir-low 81 Mg Tbec (Aspirin) .Marland Kitchen.. 1 once daily after meal 7)  Miralax Powd (Polyethylene glycol 3350) .Marland Kitchen.. 17g by mouth once daily as needed constipation  Patient Instructions: 1)  Please schedule a follow-up appointment in 1 year well w/labs v70.0.  Contraindications/Deferment of Procedures/Staging:    Test/Procedure: Pneumovax vaccine    Reason for deferment: patient declined     Test/Procedure: FLU VAX    Reason for deferment: patient declined 0 Prescriptions: CALAN SR 120 MG  TBCR (VERAPAMIL HCL) 1 by mouth qd  #30 x 12   Entered and Authorized by:   Tresa Garter MD   Signed by:   Tresa Garter MD on 10/28/2009   Method used:   Print then Give to Patient   RxID:   [id number] BENAZEPRIL-HYDROCHLOROTHIAZIDE 20-25 MG  TABS (BENAZEPRIL-HYDROCHLOROTHIAZIDE) 1 by mouth qd  #30 x 12   Entered and Authorized by:   Tresa Garter MD   Signed by:   Tresa Garter MD on 10/28/2009   Method used:   Print then Give to Patient   RxID:   [id number]

## 2010-10-26 NOTE — Procedures (Signed)
Summary: Colonoscopy  Patient: Alexandra Scott Note: All result statuses are Final unless otherwise noted.  Tests: (1) Colonoscopy (COL)   COL Colonoscopy           DONE     [facility]     [address]     [place], [place]  [address]           COLONOSCOPY PROCEDURE REPORT           PATIENT:  Alexandra Scott  MR#:  [id number]     BIRTHDATE:  07/21/1946, 63 yrs. old  GENDER:  female           ENDOSCOPIST:  Judie Petit T. Russella Dar, MD, [facility]           PROCEDURE DATE:  11/16/2009     PROCEDURE:  Colonoscopy [address]     ASA CLASS:  Class II     INDICATIONS:  1) Elevated Risk Screening  2) family history of     colon cancer-bother with colon cancer.           MEDICATIONS:   Fentanyl 75 mcg IV, Versed 9 mg IV           DESCRIPTION OF PROCEDURE:   After the risks benefits and     alternatives of the procedure were thoroughly explained, informed     consent was obtained.  Digital rectal exam was performed and     revealed no abnormalities.   The LB [id number]-[id number] endoscope     was introduced through the anus and advanced to the cecum, which     was identified by both the appendix and ileocecal valve, without     limitations.  The quality of the prep was excellent, using     MoviPrep.  The instrument was then slowly withdrawn as the colon     was fully examined.     <<PROCEDUREIMAGES>>           FINDINGS:  Moderate diverticulosis was found ascending colon to     sigmoid colon, more concentrated in the left colon.  This was     otherwise a normal examination of the colon. Retroflexed views in     the rectum revealed no abnormalities. The time to cecum = 4.5     minutes. The scope was then withdrawn (time =  10.33  min) from the     patient and the procedure completed.           COMPLICATIONS:  None           ENDOSCOPIC IMPRESSION:     1) Moderate diverticulosis ascending colon to sigmoid colon           RECOMMENDATIONS:     1) High fiber diet with liberal fluid intake.    2) Repeat Colonoscopy in 5 years.           Venita Lick. Russella Dar, MD, [facility]           CC: Alexandra Scott. Plotnikov, MD           n.     Alexandra DoctorJudie Petit T. Stark at 11/16/2009 09:20 AM           Perlie Gold, [phone]  Note: An exclamation mark (!) indicates a result that was not dispersed into the flowsheet. Document Creation Date: 11/16/2009 9:20 AM _______________________________________________________________________  (1) Order result status: Final Collection or observation date-time: 11/16/2009 09:17 Requested date-time:  Receipt date-time:  Reported date-time:  Referring Physician:  Ordering Physician: Claudette Head [phone]) Specimen Source:  Source: Launa Grill Order Number: [id number] Lab site:   Appended Document: Colonoscopy    Clinical Lists Changes  Observations: Added new observation of COLONNXTDUE: 10/2014 (11/16/2009 11:08)

## 2010-10-26 NOTE — Progress Notes (Signed)
Summary: OV TODAY  Phone Note Call from Patient   Summary of Call: Pt concerned about elevated bp. Last night 152/92. Also c/o achyness in legs.  Initial call taken by: Lamar Sprinkles, CMA,  Feb 05, 2010 8:48 AM  Follow-up for Phone Call        Scheduled for office visit today Follow-up by: Lamar Sprinkles, CMA,  Feb 05, 2010 11:32 AM

## 2010-10-26 NOTE — Miscellaneous (Signed)
Summary: [facility]  Clinical Lists Changes  Medications: Added new medication of MOVIPREP 100 GM  SOLR (PEG-KCL-NACL-NASULF-NA ASC-C) As per prep instructions. - Signed Rx of MOVIPREP 100 GM  SOLR (PEG-KCL-NACL-NASULF-NA ASC-C) As per prep instructions.;  #1 x 0;  Signed;  Entered by: Ezra Sites RN;  Authorized by: Meryl Dare MD [facility];  Method used: Electronically to [facility] #[id number]*, [address], [place], [place]  [address], Ph: [phone], Fax: [phone] Observations: Added new observation of NKA: T (11/02/2009 16:30)    Prescriptions: MOVIPREP 100 GM  SOLR (PEG-KCL-NACL-NASULF-NA ASC-C) As per prep instructions.  #1 x 0   Entered by:   Ezra Sites RN   Authorized by:   Meryl Dare MD [facility]   Signed by:   Ezra Sites RN on 11/02/2009   Method used:   Electronically to        [id number]* (retail)       [address] [place], [place]  [address]       Ph: [phone]       Fax: [phone]   RxID:   [id number]

## 2010-10-26 NOTE — Miscellaneous (Signed)
Summary: BONE DENSITY  Clinical Lists Changes  Orders: Added new Test order of T-Lumbar Vertebral Assessment (77082) - Signed 

## 2010-10-26 NOTE — Miscellaneous (Signed)
Summary: Orders Update  Clinical Lists Changes  Orders: Added new Test order of Arterial Duplex Lower Extremity (Arterial Duplex Low) - Signed 

## 2010-10-26 NOTE — Progress Notes (Signed)
Summary: BP MEDICATION?   Phone Note Call from Patient Call back at Home Phone [phone]   Caller: Patient Summary of Call: Patient called stating that she recently had a xray that showed mild arthritis. She has appt scheduled to see ortho 07/01/10 and would like to know if she will need an MRI prior to that appt or will ortho order it if needed. Please advise Thanks Initial call taken by: Rock Nephew CMA,  June 25, 2010 2:26 PM  Follow-up for Phone Call        No need for prior MRI - ortho will order if needed Follow-up by: Tresa Garter MD,  June 25, 2010 5:04 PM  Additional Follow-up for Phone Call Additional follow up Details #1::        Pt informed, She wants MD to consider that her symptoms became worse after change in her BP medication. Could this be related to the weakness?   Pt c/o weakness in both upper legs. She has pain & weakness in her right thigh. Advised she keep apt w/Dr Darrick Penna next week for further eval but she still wants to know what Dr Posey Rea thinks about BP med as possible cause.  Additional Follow-up by: Lamar Sprinkles, CMA,  June 25, 2010 5:40 PM    Additional Follow-up for Phone Call Additional follow up Details #2::    This would be VERY unlikely. She can stop one of her BP meds at the time x 2-3 d and see if better... Pls keep appt w/Dr Darrick Penna Follow-up by: Tresa Garter MD,  June 28, 2010 1:35 PM  Additional Follow-up for Phone Call Additional follow up Details #3:: Details for Additional Follow-up Action Taken: Pt informed  Additional Follow-up by: Lamar Sprinkles, CMA,  June 29, 2010 10:20 AM

## 2010-10-26 NOTE — Assessment & Plan Note (Signed)
Summary: WEAKNESS AND TIGHTNESS  X YRS OF UPPER LEG -BARELY CAN WALK--STC   Vital Signs:  Patient profile:   65 year old female Height:      60 inches Weight:      154 pounds BMI:     30.18 Temp:     98.4 degrees F oral Pulse rate:   84 / minute Pulse rhythm:   regular Resp:     16 per minute BP sitting:   120 / 80  (left arm) Cuff size:   regular  Vitals Entered By: Lanier Prude, CMA(AAMA) (June 15, 2010 9:07 AM) CC: weakness/tightness in both thighs & groin pain Is Patient Diabetic? No Comments pt is not taking Miralax, Tylenol, Tramadol or Prednisone   CC:  weakness/tightness in both thighs & groin pain.  History of Present Illness: C/o pain in groin R>>L, throbbing; hard to get in the car and going up steps x months  C/o SOB w /exertion  Preventive Screening-Counseling & Management  Alcohol-Tobacco     Smoking Status: never  Current Medications (verified): 1)  Vitamin D3 1000 Unit  Tabs (Cholecalciferol) .Marland Kitchen.. 1 By Mouth T, Th, Sat 2)  Fish Oil   Oil (Fish Oil) .Marland Kitchen.. 1 By Mouth Bid 3)  Centrum Silver   Tabs (Multiple Vitamins-Minerals) .... Take 1 Tab By Mouth Every Other Day 4)  Aspir-Low 81 Mg Tbec (Aspirin) .Marland Kitchen.. 1 Once Daily After Meal 5)  Miralax   Powd (Polyethylene Glycol 3350) .Marland Kitchen.. 17g By Mouth Once Daily As Needed Constipation 6)  Vitamin C .... 1 By Mouth Qod 7)  Calcium 600mg  .... 1 By Mouth Bid 8)  Magnesium 200 Mg .Marland Kitchen.. 1 By Mouth Bid 9)  Tylenol Extra Strength 500 Mg Tabs (Acetaminophen) .... Prn 10)  Metamucil 30.9 % Powd (Psyllium) .... Daily As Directed 11)  Tramadol Hcl 50 Mg Tabs (Tramadol Hcl) .Marland Kitchen.. 1-2 Tabs By Mouth Two Times A Day As Needed Pain 12)  Benazepril Hcl 40 Mg Tabs (Benazepril Hcl) .Marland Kitchen.. 1 By Mouth Qd 13)  Maxzide-25 37.5-25 Mg Tabs (Triamterene-Hctz) .Marland Kitchen.. 1 By Mouth Qd 14)  Amlodipine Besylate 5 Mg Tabs (Amlodipine Besylate) .Marland Kitchen.. 1 By Mouth Qd 15)  Prednisone 10 Mg Tabs (Prednisone) .... Take 40mg  Qd For 3 Days, Then 20 Mg Qd For  3 Days, Then 10mg  Qd For 6 Days, Then Stop. Take Pc.  Allergies (verified): No Known Drug Allergies  Past History:  Past Medical History: Last updated: 02/05/2010 Hypertension Colonic polyps, hx of  Dr Russella Dar L breast cyst 2008 SER Gyn Dr Huel [place] Constipation Low back pain ? spinal stenosis  Social History: Last updated: 10/24/2007 Occupation: running a day Care Married Never Smoked  Review of Systems       The patient complains of difficulty walking.  The patient denies fever, chest pain, and abdominal pain.    Physical Exam  General:  overweight-appearing.   Nose:  External nasal examination shows no deformity or inflammation. Nasal mucosa are pink and moist without lesions or exudates. Mouth:  good dentition.   Neck:  No deformities, masses, or tenderness noted. Lungs:  Normal respiratory effort, chest expands symmetrically. Lungs are clear to auscultation, no crackles or wheezes. Heart:  Normal rate and regular rhythm. S1 and S2 normal without gallop, murmur, click, rub or other extra sounds. Abdomen:  Bowel sounds positive,abdomen soft and non-tender without masses, organomegaly or hernias noted. NT Msk:  No deformity or scoliosis noted of thoracic or lumbar spine.  Str. legg elev neg B  R ileopsoas tender in groin Skin:  Intact without suspicious lesions or rashes Psych:  Cognition and judgment appear intact. Alert and cooperative with normal attention span and concentration. No apparent delusions, illusions, hallucinations   Impression & Recommendations:  Problem # 1:  HIP PAIN (ICD-719.45) B R>>L - ileopsoas strain Assessment Deteriorated See "Patient Instructions".  Her updated medication list for this problem includes:    Aspir-low 81 Mg Tbec (Aspirin) .Marland Kitchen... 1 once daily after meal    Tylenol Extra Strength 500 Mg Tabs (Acetaminophen) .Marland Kitchen... Prn    Tramadol Hcl 50 Mg Tabs (Tramadol hcl) .Marland Kitchen... 1-2 tabs by mouth two times a day as needed pain    Naproxen Sodium  220 Mg Tabs (Naproxen sodium) .Marland Kitchen... 2 two times a day  Orders: Doppler Referral (Doppler) r/o PVD Sports Medicine (Sports Med) Dr Darrick Penna  Problem # 2:  LOW BACK PAIN (ICD-724.2) Assessment: Unchanged  Her updated medication list for this problem includes:    Aspir-low 81 Mg Tbec (Aspirin) .Marland Kitchen... 1 once daily after meal    Tylenol Extra Strength 500 Mg Tabs (Acetaminophen) .Marland Kitchen... Prn    Tramadol Hcl 50 Mg Tabs (Tramadol hcl) .Marland Kitchen... 1-2 tabs by mouth two times a day as needed pain    Naproxen Sodium 220 Mg Tabs (Naproxen sodium) .Marland Kitchen... 2 two times a day  Orders: Sports Medicine (Sports Med)  Complete Medication List: 1)  Vitamin D3 1000 Unit Tabs (Cholecalciferol) .Marland Kitchen.. 1 by mouth t, th, sat 2)  Fish Oil Oil (Fish oil) .Marland Kitchen.. 1 by mouth bid 3)  Centrum Silver Tabs (Multiple vitamins-minerals) .... Take 1 tab by mouth every other day 4)  Aspir-low 81 Mg Tbec (Aspirin) .Marland Kitchen.. 1 once daily after meal 5)  Miralax Powd (Polyethylene glycol 3350) .Marland Kitchen.. 17g by mouth once daily as needed constipation 6)  Vitamin C  .... 1 by mouth qod 7)  Calcium 600mg   .... 1 by mouth bid 8)  Magnesium 200 Mg  .Marland KitchenMarland Kitchen. 1 by mouth bid 9)  Tylenol Extra Strength 500 Mg Tabs (Acetaminophen) .... Prn 10)  Metamucil 30.9 % Powd (Psyllium) .... Daily as directed 11)  Tramadol Hcl 50 Mg Tabs (Tramadol hcl) .Marland Kitchen.. 1-2 tabs by mouth two times a day as needed pain 12)  Benazepril Hcl 40 Mg Tabs (Benazepril hcl) .Marland Kitchen.. 1 by mouth qd 13)  Maxzide-25 37.5-25 Mg Tabs (Triamterene-hctz) .Marland Kitchen.. 1 by mouth qd 14)  Amlodipine Besylate 5 Mg Tabs (Amlodipine besylate) .Marland Kitchen.. 1 by mouth qd 15)  Prednisone 10 Mg Tabs (Prednisone) .... Take 40mg  qd for 3 days, then 20 mg qd for 3 days, then 10mg  qd for 6 days, then stop. take pc. 16)  Naproxen Sodium 220 Mg Tabs (Naproxen sodium) .... 2 two times a day  Other Orders: Depo- Medrol 40mg  (J1030) Depo- Medrol 80mg  (J1040) Admin of Therapeutic Inj  intramuscular or subcutaneous (59563)  Patient  Instructions: 1)  Deep stretching exercises for groin I gave you 2)  Please schedule a follow-up appointment in 2 months.   Medication Administration  Injection # 1:    Medication: Depo- Medrol 40mg     Diagnosis: HIP PAIN (ICD-719.45)    Route: IM    Site: RUOQ gluteus    Exp Date: 12/25/2012    Lot #: [id number]    Mfr: Pharmacia    Comments: pt rec 120mg      Patient tolerated injection without complications    Given by: Lanier Prude, CMA(AAMA) (June 15, 2010 9:39 AM)  Injection # 2:  Medication: Depo- Medrol 80mg     Diagnosis: HIP PAIN (ICD-719.45)    Comments: same as above    Given by: Lanier Prude, CMA(AAMA) (June 15, 2010 9:39 AM)  Orders Added: 1)  Depo- Medrol 40mg  [J1030] 2)  Depo- Medrol 80mg  [J1040] 3)  Admin of Therapeutic Inj  intramuscular or subcutaneous [96372] 4)  Doppler Referral [Doppler] 5)  Sports Medicine [Sports Med] 6)  Est. Patient Level IV [27253]

## 2010-10-26 NOTE — Letter (Signed)
Summary: *Consult Note  [facility]  [address]   [place], [place] [address]   Phone: [phone]  Fax: [phone]    Re:    Alexandra Scott DOB:    [birth year] Dr. Rollene Rotunda Plotnikov 10//5/11   Dear Trinna Post:    Thank you for requesting that we see the above patient for consultation.  A copy of the detailed office note will be sent under separate cover, for your review.  Evaluation today is consistent with:  1)  DEGENERATIVE JOINT DISEASE, RIGHT HIP (ICD-715.95) - this is the key cause for her RT groin pain and the recent worsening of symptoms 2) DDD lumbar spine - I think this is severe enough that she probably has nerve root impingement and the bilateral thigh weakness that she complains of and blames on BP meds.  I explained that I don't think medications have any effect on this.   Our recommendation is for: maximize symptom relief with regular dosing of either tramadol or NSAIDs.  After discussion today she is going to try 1 aleve two times a day  and alternate with tylenol two times a day.  If she fails to get enough relief from medications she is a good candidate for total hip replacement.  New Orders include:  1)  Diagnostic X-Ray/Fluoroscopy [Diagnostic X-Ray/Flu] 2)  Consultation Level III [13086]   After today's visit, the patients current medications include: 1)  AMLODIPINE BESYLATE 5 MG TABS (AMLODIPINE BESYLATE) 1 by mouth qd 2)  MAXZIDE-25 37.5-25 MG TABS (TRIAMTERENE-HCTZ) 1 by mouth qd 3)  BENAZEPRIL HCL 40 MG TABS (BENAZEPRIL HCL) 1 by mouth qd 4)  VITAMIN D3 1000 UNIT  TABS (CHOLECALCIFEROL) 1 by mouth T, Th, Sat 5)  FISH OIL   OIL (FISH OIL) 1 by mouth bid 6)  CENTRUM SILVER   TABS (MULTIPLE VITAMINS-MINERALS) Take 1 tab by mouth every other day 7)  ASPIR-LOW 81 MG TBEC (ASPIRIN) 1 once daily after meal 8)  MIRALAX   POWD (POLYETHYLENE GLYCOL 3350) 17g by mouth once daily as needed constipation 9)  TYLENOL EXTRA STRENGTH 500 MG TABS  (ACETAMINOPHEN) prn 10)  METAMUCIL 30.9 % POWD (PSYLLIUM) daily as directed 11)  TRAMADOL HCL 50 MG TABS (TRAMADOL HCL) 1-2 tabs by mouth two times a day as needed pain 12)  NAPROXEN SODIUM 220 MG TABS (NAPROXEN SODIUM) 2 two times a day 13)  * VITAMIN C 1 by mouth qod 14)  * CALCIUM 600MG  1 by mouth bid 15)  * MAGNESIUM 200 MG 1 by mouth bid   Thank you for this consultation.  If you have any further questions regarding the care of this patient, please do not hesitate to contact me @ [phone].  Thank you for this opportunity to look after your patient.  Sincerely,  Vincent Gros MD

## 2010-10-26 NOTE — Assessment & Plan Note (Signed)
Summary: NP,CHRONIC HIP PAIN,MC   Vital Signs:  Patient profile:   65 year old female Height:      61 inches Weight:      154 pounds BMI:     29.20 Pulse rate:   88 / minute BP sitting:   144 / 83  Vitals Entered By: Rochele Pages RN (July 01, 2010 2:32 PM) CC: NP bilat anterior thigh pain, rt hip pain   CC:  NP bilat anterior thigh pain and rt hip pain.  History of Present Illness: Patient referred courtesy of Dr Plotnikov  65 yo F referred for R leg pain for the past several years. She has noticed the weakness most when getting out of chairs and going up stairs. It was better when she was on a walking program but has deteriorated after she had to stop. She also complains of R groin pain for which she was evaluated by her OBGYN in May. His recommendation was that she start something for constipation which she did. This did not relieve her hip pain.  Patient has had progression proximal muscle weakness since July when she changed blood pressure medications. She attributes this to her medication, though her PCP does not agree.  She does have a hx of lumbar DDD and DJD.  She denies radicular sxs.  REview of LS spine films from earlier this year so significant change at multiple lumbar levels, spurring, disk space loss and acquired scoliosis.  Nevertheless, patient feels this pain is different from anything she has had on back in past in that it seems to really focus on RT groin and changes her gait. The bilateral thigh weakness has been more gradual and progressive.  Allergies: No Known Drug Allergies  Physical Exam  General:  Well-developed,well-nourished,in no acute distress; alert,appropriate and cooperative throughout examination Msk:  Hips: Internal and external rotation on L side normal, with limited internal rotation in particular on R side. Deficit of 10-15 degrees. Rull ROM with flexion and extension at hip bilaterally.  Weak hip flexion bilaterally (4+/5) but L stronger  than R. Unable to do FABER on R, nml FABER on L. 4/5 hip abduction and adduction on R. Neurologic:  Gait: Patient takes small steps, favoring L side. Externally rotates R foot. Additional Exam:  AP Pelvis significant loss of joint space RT hip not seen on left  RT Hip Moderately severe DJD of RT hip with scleroiss and loss of joint space   Impression & Recommendations:  Problem # 1:  HIP PAIN (ICD-719.45)  Patient with hip pain that may be as a result of DJD in back or due to arthritis in hip. Given limited motion of hip joint and pattern of pain, suspect arthritic changes in R hip with possible bone spur. Patient will have A/P pelvis and hip films and will call with results. Advised to continue Naproxen and Tylenol as needed for pain.  Her updated medication list for this problem includes:    Aspir-low 81 Mg Tbec (Aspirin) .Marland Kitchen... 1 once daily after meal    Tylenol Extra Strength 500 Mg Tabs (Acetaminophen) .Marland Kitchen... Prn    Tramadol Hcl 50 Mg Tabs (Tramadol hcl) .Marland Kitchen... 1-2 tabs by mouth two times a day as needed pain    Naproxen Sodium 220 Mg Tabs (Naproxen sodium) .Marland Kitchen... 2 two times a day    Her updated medication list for this problem includes:    Aspir-low 81 Mg Tbec (Aspirin) .Marland Kitchen... 1 once daily after meal    Tylenol Extra  Strength 500 Mg Tabs (Acetaminophen) .Marland Kitchen... Prn    Tramadol Hcl 50 Mg Tabs (Tramadol hcl) .Marland Kitchen... 1-2 tabs by mouth two times a day as needed pain    Naproxen Sodium 220 Mg Tabs (Naproxen sodium) .Marland Kitchen... 2 two times a day  Orders: Diagnostic X-Ray/Fluoroscopy (Diagnostic X-Ray/Flu)  Problem # 2:  LOW BACK PAIN (ICD-724.2)  X-ray shows significant DJD of lumbar spine. Hip films will help differentiate whether current symptoms derive from back or hip.  I suspect the DDD in upper lumbar spine causes nerve impingement and the secondary thigh weakness I advised her that this has nothing to do w BP meds  Her updated medication list for this problem includes:    Aspir-low  81 Mg Tbec (Aspirin) .Marland Kitchen... 1 once daily after meal    Tylenol Extra Strength 500 Mg Tabs (Acetaminophen) .Marland Kitchen... Prn    Tramadol Hcl 50 Mg Tabs (Tramadol hcl) .Marland Kitchen... 1-2 tabs by mouth two times a day as needed pain    Naproxen Sodium 220 Mg Tabs (Naproxen sodium) .Marland Kitchen... 2 two times a day  Her updated medication list for this problem includes:    Aspir-low 81 Mg Tbec (Aspirin) .Marland Kitchen... 1 once daily after meal    Tylenol Extra Strength 500 Mg Tabs (Acetaminophen) .Marland Kitchen... Prn    Tramadol Hcl 50 Mg Tabs (Tramadol hcl) .Marland Kitchen... 1-2 tabs by mouth two times a day as needed pain    Naproxen Sodium 220 Mg Tabs (Naproxen sodium) .Marland Kitchen... 2 two times a day  Problem # 3:  DEGENERATIVE JOINT DISEASE, RIGHT HIP (ICD-715.95)  Her updated medication list for this problem includes:    Aspir-low 81 Mg Tbec (Aspirin) .Marland Kitchen... 1 once daily after meal    Tylenol Extra Strength 500 Mg Tabs (Acetaminophen) .Marland Kitchen... Prn    Tramadol Hcl 50 Mg Tabs (Tramadol hcl) .Marland Kitchen... 1-2 tabs by mouth two times a day as needed pain    Naproxen Sodium 220 Mg Tabs (Naproxen sodium) .Marland Kitchen... 2 two times a day  This is fairly severe She could try more consistent use of tramadol or NSAIDs to see if she gets enough pain relief If sxs too severe she is certainly a candidate for THR  will forward information to Dr Posey Rea and discuss his preference  Complete Medication List: 1)  Amlodipine Besylate 5 Mg Tabs (Amlodipine besylate) .Marland Kitchen.. 1 by mouth qd 2)  Maxzide-25 37.5-25 Mg Tabs (Triamterene-hctz) .Marland Kitchen.. 1 by mouth qd 3)  Benazepril Hcl 40 Mg Tabs (Benazepril hcl) .Marland Kitchen.. 1 by mouth qd 4)  Vitamin D3 1000 Unit Tabs (Cholecalciferol) .Marland Kitchen.. 1 by mouth t, th, sat 5)  Fish Oil Oil (Fish oil) .Marland Kitchen.. 1 by mouth bid 6)  Centrum Silver Tabs (Multiple vitamins-minerals) .... Take 1 tab by mouth every other day 7)  Aspir-low 81 Mg Tbec (Aspirin) .Marland Kitchen.. 1 once daily after meal 8)  Miralax Powd (Polyethylene glycol 3350) .Marland Kitchen.. 17g by mouth once daily as needed  constipation 9)  Tylenol Extra Strength 500 Mg Tabs (Acetaminophen) .... Prn 10)  Metamucil 30.9 % Powd (Psyllium) .... Daily as directed 11)  Tramadol Hcl 50 Mg Tabs (Tramadol hcl) .Marland Kitchen.. 1-2 tabs by mouth two times a day as needed pain 12)  Naproxen Sodium 220 Mg Tabs (Naproxen sodium) .... 2 two times a day 13)  Vitamin C  .... 1 by mouth qod 14)  Calcium 600mg   .... 1 by mouth bid 15)  Magnesium 200 Mg  .Marland KitchenMarland Kitchen. 1 by mouth bid

## 2010-10-26 NOTE — Assessment & Plan Note (Signed)
Summary: increased leg pain & elevated BP / SD   Vital Signs:  Patient profile:   65 year old female Height:      60 inches Weight:      151.75 pounds BMI:     29.74 O2 Sat:      97 % on Room air Temp:     98.3 degrees F oral Pulse rate:   101 / minute BP sitting:   160 / 82  (left arm) Cuff size:   regular  Vitals Entered By: Lucious Groves (Feb 05, 2010 3:38 PM)  O2 Flow:  Room air CC: C/O continued leg pain over the past couple of weeks and increased BP this week./kb Is Patient Diabetic? No Pain Assessment Patient in pain? yes     Location: legs Intensity: 3 Type: throbbing Comments Patient notes that she has had dizziness, and been somewhat light headed. Patient denies HA./kb   CC:  C/O continued leg pain over the past couple of weeks and increased BP this week./kb.  History of Present Illness: F/u HTN C/o pain in B legs x months, some LBP  Current Medications (verified): 1)  Benazepril-Hydrochlorothiazide 20-25 Mg  Tabs (Benazepril-Hydrochlorothiazide) .Marland Kitchen.. 1 By Mouth Qd 2)  Calan Sr 120 Mg  Tbcr (Verapamil Hcl) .Marland Kitchen.. 1 By Mouth Qd 3)  Vitamin D3 1000 Unit  Tabs (Cholecalciferol) .Marland Kitchen.. 1 By Mouth T, Th, Sat 4)  Fish Oil   Oil (Fish Oil) .Marland Kitchen.. 1 By Mouth Bid 5)  Centrum Silver   Tabs (Multiple Vitamins-Minerals) .... Take 1 Tab By Mouth Every Other Day 6)  Aspir-Low 81 Mg Tbec (Aspirin) .Marland Kitchen.. 1 Once Daily After Meal 7)  Miralax   Powd (Polyethylene Glycol 3350) .Marland Kitchen.. 17g By Mouth Once Daily As Needed Constipation 8)  Vitamin C .... 1 By Mouth Qod 9)  Calcium 600mg  .... 1 By Mouth Bid 10)  Magnesium 200 Mg .Marland Kitchen.. 1 By Mouth Bid 11)  Tylenol Extra Strength 500 Mg Tabs (Acetaminophen) .... Prn  Allergies (verified): No Known Drug Allergies  Past History:  Past Medical History: Hypertension Colonic polyps, hx of  Dr Russella Dar L breast cyst 2008 SER Gyn Dr Huel [place] Constipation Low back pain ? spinal stenosis  Review of Systems  The patient denies fever, dyspnea on  exertion, and abdominal pain.    Physical Exam  General:  overweight-appearing.   Nose:  External nasal examination shows no deformity or inflammation. Nasal mucosa are pink and moist without lesions or exudates. Mouth:  good dentition.   Neck:  No deformities, masses, or tenderness noted. Lungs:  Normal respiratory effort, chest expands symmetrically. Lungs are clear to auscultation, no crackles or wheezes. Heart:  Normal rate and regular rhythm. S1 and S2 normal without gallop, murmur, click, rub or other extra sounds. Abdomen:  Bowel sounds positive,abdomen soft and non-tender without masses, organomegaly or hernias noted. Msk:  No deformity or scoliosis noted of thoracic or lumbar spine.  Str. legg elev neg B Extremities:  No clubbing, cyanosis, edema, or deformity noted with normal full range of motion of all joints.   Neurologic:  No cranial nerve deficits noted. Station and gait are normal. Plantar reflexes are down-going bilaterally. DTRs are symmetrical throughout. Sensory, motor and coordinative functions appear intact. Skin:  Intact without suspicious lesions or rashes Psych:  Cognition and judgment appear intact. Alert and cooperative with normal attention span and concentration. No apparent delusions, illusions, hallucinations   Impression & Recommendations:  Problem # 1:  LOW BACK PAIN (ICD-724.2) Assessment  Deteriorated  Her updated medication list for this problem includes:    Aspir-low 81 Mg Tbec (Aspirin) .Marland Kitchen... 1 once daily after meal    Tylenol Extra Strength 500 Mg Tabs (Acetaminophen) .Marland Kitchen... Prn    Naproxen 500 Mg Tabs (Naproxen) .Marland Kitchen... 1 by mouth two times a day pc for pain/arthritis    Tramadol Hcl 50 Mg Tabs (Tramadol hcl) .Marland Kitchen... 1-2 tabs by mouth two times a day as needed pain  Orders: T-Lumbar Spine 2 Views (72100TC)  Problem # 2:  HYPERTENSION (ICD-401.9) Assessment: Deteriorated  The following medications were removed from the medication list:     Benazepril-hydrochlorothiazide 20-25 Mg Tabs (Benazepril-hydrochlorothiazide) .Marland Kitchen... 1 by mouth qd    Calan Sr 120 Mg Tbcr (Verapamil hcl) .Marland Kitchen... 1 by mouth qd Her updated medication list for this problem includes:    Exforge Hct 10-320-25 Mg Tabs (Amlodipine-valsartan-hctz) .Marland Kitchen... 1 by mouth q am  Complete Medication List: 1)  Vitamin D3 1000 Unit Tabs (Cholecalciferol) .Marland Kitchen.. 1 by mouth t, th, sat 2)  Fish Oil Oil (Fish oil) .Marland Kitchen.. 1 by mouth bid 3)  Centrum Silver Tabs (Multiple vitamins-minerals) .... Take 1 tab by mouth every other day 4)  Aspir-low 81 Mg Tbec (Aspirin) .Marland Kitchen.. 1 once daily after meal 5)  Miralax Powd (Polyethylene glycol 3350) .Marland Kitchen.. 17g by mouth once daily as needed constipation 6)  Vitamin C  .... 1 by mouth qod 7)  Calcium 600mg   .... 1 by mouth bid 8)  Magnesium 200 Mg  .Marland KitchenMarland Kitchen. 1 by mouth bid 9)  Tylenol Extra Strength 500 Mg Tabs (Acetaminophen) .... Prn 10)  Exforge Hct 10-320-25 Mg Tabs (Amlodipine-valsartan-hctz) .Marland Kitchen.. 1 by mouth q am 11)  Naproxen 500 Mg Tabs (Naproxen) .Marland Kitchen.. 1 by mouth two times a day pc for pain/arthritis 12)  Tramadol Hcl 50 Mg Tabs (Tramadol hcl) .Marland Kitchen.. 1-2 tabs by mouth two times a day as needed pain  Patient Instructions: 1)  Use stretching and balance exercises that I have provided (15 min. or longer every day) 2)  Please schedule a follow-up appointment in 2 months. 3)  BMP prior to visit, ICD-9:401.1 Prescriptions: TRAMADOL HCL 50 MG TABS (TRAMADOL HCL) 1-2 tabs by mouth two times a day as needed pain  #120 x 3   Entered and Authorized by:   Tresa Garter MD   Signed by:   Tresa Garter MD on 02/05/2010   Method used:   Print then Give to Patient   RxID:   [id number] NAPROXEN 500 MG TABS (NAPROXEN) 1 by mouth two times a day pc for pain/arthritis  #60 x 3   Entered and Authorized by:   Tresa Garter MD   Signed by:   Tresa Garter MD on 02/05/2010   Method used:   Print then Give to Patient   RxID:    [id number] EXFORGE HCT 10-320-25 MG TABS (AMLODIPINE-VALSARTAN-HCTZ) 1 by mouth q am  #30 x 12   Entered and Authorized by:   Tresa Garter MD   Signed by:   Tresa Garter MD on 02/05/2010   Method used:   Print then Give to Patient   RxID:   [id number]

## 2010-10-26 NOTE — Letter (Signed)
Summary: [facility] Instructions  [facility]  [address] [place], [place] [address]   Phone: [phone]  Fax: [phone]       GEARLINE SPILMAN    02-02-1946    MRN: [id number]        Procedure Day /Date: Monday 11-16-09     Arrival Time: 8:00 am     Procedure Time: 9:00 am     Location of Procedure:                    [facility] (4th Floor)  PREPARATION FOR COLONOSCOPY WITH MOVIPREP   Starting 5 days prior to your procedure 11-11-09 do not eat nuts, seeds, popcorn, corn, beans, peas,  salads, or any raw vegetables.  Do not take any fiber supplements (e.g. Metamucil, Citrucel, and Benefiber).  THE DAY BEFORE YOUR PROCEDURE         DATE: 11-15-09  DAY: Sunday  1.  Drink clear liquids the entire day-NO SOLID FOOD  2.  Do not drink anything colored red or purple.  Avoid juices with pulp.  No orange juice.  3.  Drink at least 64 oz. (8 glasses) of fluid/clear liquids during the day to prevent dehydration and help the prep work efficiently.  CLEAR LIQUIDS INCLUDE: Water Jello Ice Popsicles Tea (sugar ok, no milk/cream) Powdered fruit flavored drinks Coffee (sugar ok, no milk/cream) Gatorade Juice: apple, white grape, white cranberry  Lemonade Clear bullion, consomm, broth Carbonated beverages (any kind) Strained chicken noodle soup Hard Candy                             4.  In the morning, mix first dose of MoviPrep solution:    Empty 1 Pouch A and 1 Pouch B into the disposable container    Add lukewarm drinking water to the top line of the container. Mix to dissolve    Refrigerate (mixed solution should be used within 24 hrs)  5.  Begin drinking the prep at 5:00 p.m. The MoviPrep container is divided by 4 marks.   Every 15 minutes drink the solution down to the next mark (approximately 8 oz) until the full liter is complete.   6.  Follow completed prep with 16 oz of clear liquid of your choice (Nothing red or purple).   Continue to drink clear liquids until bedtime.  7.  Before going to bed, mix second dose of MoviPrep solution:    Empty 1 Pouch A and 1 Pouch B into the disposable container    Add lukewarm drinking water to the top line of the container. Mix to dissolve    Refrigerate  THE DAY OF YOUR PROCEDURE      DATE: 11-16-09 DAY: Monday  Beginning at 4:00 am (5 hours before procedure):         1. Every 15 minutes, drink the solution down to the next mark (approx 8 oz) until the full liter is complete.  2. Follow completed prep with 16 oz. of clear liquid of your choice.    3. You may drink clear liquids until 7:00 am (2 HOURS BEFORE PROCEDURE).   MEDICATION INSTRUCTIONS  Unless otherwise instructed, you should take regular prescription medications with a small sip of water   as early as possible the morning of your procedure.    Additional medication instructions: Hold Benazepril/HCTZ morning of procedure.         OTHER  INSTRUCTIONS  You will need a responsible adult at least 65 years of age to accompany you and drive you home.   This person must remain in the waiting room during your procedure.  Wear loose fitting clothing that is easily removed.  Leave jewelry and other valuables at home.  However, you may wish to bring a book to read or  an iPod/MP3 player to listen to music as you wait for your procedure to start.  Remove all body piercing jewelry and leave at home.  Total time from sign-in until discharge is approximately 2-3 hours.  You should go home directly after your procedure and rest.  You can resume normal activities the  day after your procedure.  The day of your procedure you should not:   Drive   Make legal decisions   Operate machinery   Drink alcohol   Return to work  You will receive specific instructions about eating, activities and medications before you leave.    The above instructions have been reviewed and explained to me by  Ezra Sites  RN  November 02, 2009 5:05 PM  I fully understand and can verbalize these instructions _____________________________ Date _________

## 2010-10-26 NOTE — Progress Notes (Signed)
Summary: BP MED PROBLEM  Phone Note Call from Patient   Summary of Call: Pt states BP med was changed 3 weeks ago. Pt noticed last week she developed a cough, voice hoarseness, and bruising? Is this from BP med? Pt states RX is to expensive and  would like to know if the other BP med she was on can be increased because that med was only $25? Please advise Initial call taken by: Josph Macho RMA,  February 25, 2010 3:18 PM  Follow-up for Phone Call        D/c Exforge (doubt side effects, however anything is possible) Start Lotensin and Maxzide Follow-up by: Tresa Garter MD,  February 26, 2010 7:16 AM  Additional Follow-up for Phone Call Additional follow up Details #1::        Pt informed  Additional Follow-up by: Lamar Sprinkles, CMA,  February 26, 2010 3:04 PM    New/Updated Medications: BENAZEPRIL HCL 40 MG TABS (BENAZEPRIL HCL) 1 by mouth qd MAXZIDE-25 37.5-25 MG TABS (TRIAMTERENE-HCTZ) 1 by mouth qd xPrescriptions: MAXZIDE-25 37.5-25 MG TABS (TRIAMTERENE-HCTZ) 1 by mouth qd  #30 x 12   Entered and Authorized by:   Tresa Garter MD   Signed by:   Lamar Sprinkles, CMA on 02/26/2010   Method used:   Electronically to        [facility] [address] [id number]* (retail)       [address] [place], [place]  [address]       Ph: [phone]       Fax: [phone]   RxID:   [id number] BENAZEPRIL HCL 40 MG TABS (BENAZEPRIL HCL) 1 by mouth qd  #30 x 12   Entered and Authorized by:   Tresa Garter MD   Signed by:   Lamar Sprinkles, CMA on 02/26/2010   Method used:   Electronically to        [facility] [address] [id number]* (retail)       [address] [place], [place]  [address]       Ph: [phone]       Fax: [phone]   RxID:   [id number]

## 2010-10-28 NOTE — Miscellaneous (Signed)
Summary: mammogram 2011  Clinical Lists Changes  Observations: Added new observation of MAMMOGRAM: normal (June 28, 1946 16:18)      Preventive Care Screening  Mammogram:    Date:  21-Nov-1945    Results:  normal  Appended Document: mammogram 2011 Error on year above.  Mammogram date:08/18/2010. Results: Normal

## 2010-10-28 NOTE — Progress Notes (Signed)
  Phone Note Call from Patient   Summary of Call: pt was unsure of dr fields recommendations so called to verify.Marland KitchenMarland KitchenMarland KitchenDanniell Scott is take tramadol two times a day and with either tylenol or aleve. pt understood and agreed.  Initial call taken by: Lillia Pauls CMA,  October 11, 2010 12:08 PM

## 2010-11-10 ENCOUNTER — Other Ambulatory Visit: Payer: PRIVATE HEALTH INSURANCE

## 2010-11-10 ENCOUNTER — Encounter (INDEPENDENT_AMBULATORY_CARE_PROVIDER_SITE_OTHER): Payer: Self-pay | Admitting: *Deleted

## 2010-11-10 ENCOUNTER — Other Ambulatory Visit: Payer: Self-pay | Admitting: Internal Medicine

## 2010-11-10 DIAGNOSIS — T887XXA Unspecified adverse effect of drug or medicament, initial encounter: Secondary | ICD-10-CM

## 2010-11-10 DIAGNOSIS — I10 Essential (primary) hypertension: Secondary | ICD-10-CM

## 2010-11-10 LAB — HEPATIC FUNCTION PANEL
Albumin: 4.1 g/dL (ref 3.5–5.2)
Alkaline Phosphatase: 69 U/L (ref 39–117)

## 2010-11-10 LAB — BASIC METABOLIC PANEL
Calcium: 9.4 mg/dL (ref 8.4–10.5)
GFR: 75.6 mL/min (ref >60.00)
Glucose, Bld: 100 mg/dL — ABNORMAL HIGH (ref 70–99)
Sodium: 136 mEq/L (ref 135–145)

## 2010-11-11 ENCOUNTER — Telehealth: Payer: Self-pay | Admitting: Internal Medicine

## 2010-11-19 ENCOUNTER — Ambulatory Visit (INDEPENDENT_AMBULATORY_CARE_PROVIDER_SITE_OTHER): Payer: PRIVATE HEALTH INSURANCE | Admitting: Internal Medicine

## 2010-11-19 ENCOUNTER — Encounter: Payer: Self-pay | Admitting: Internal Medicine

## 2010-11-19 DIAGNOSIS — Z0000 Encounter for general adult medical examination without abnormal findings: Secondary | ICD-10-CM

## 2010-11-19 DIAGNOSIS — Z23 Encounter for immunization: Secondary | ICD-10-CM

## 2010-11-23 NOTE — Progress Notes (Signed)
Summary: UA order  Phone Note Call from Patient Call back at Home Phone [phone]   Caller: Mom Summary of Call: Patient came by office after having CPX labs drawn and request order for urine (per pt this is usually included). Patient notified that MD out of office and will be monday 2/20 before advisement. Pt ok to wait.Alvy Beal Archie CMA  November 11, 2010 10:10 AM   Follow-up for Phone Call        OK UA v70.0 Follow-up by: Tresa Garter MD,  November 14, 2010 12:36 PM  Additional Follow-up for Phone Call Additional follow up Details #1::        Spoke w/pt - she was assuming that her next apt - friday was for cpx. This was not put in for cpx only 15 min f/u. I advised pt that at last office visit cpx was not req by MD so she was only put in for f/u. She did notwant to reschedule and will keep as f/u apt. If you would still like u/a pt would like to have after apt.  Additional Follow-up by: Lamar Sprinkles, CMA,  November 15, 2010 1:52 PM    Additional Follow-up for Phone Call Additional follow up Details #2::    OK Thank you!  Follow-up by: Tresa Garter MD,  November 15, 2010 9:30 PM

## 2010-12-02 NOTE — Assessment & Plan Note (Signed)
Summary: 3 MO FU/NWS#   Vital Signs:  Patient profile:   65 year old female Weight:      158 pounds O2 Sat:      97 % Temp:     98.7 degrees F oral Pulse rate:   106 / minute BP sitting:   142 / 80  (left arm) Cuff size:   regular  Vitals Entered By: Lamar Sprinkles, CMA (November 19, 2010 2:51 PM) CC: F/u - C/o continued hip pain/SD   CC:  F/u - C/o continued hip pain/SD.  History of Present Illness: F/u LBP and hip pain - not better.  The patient presents for a preventive health examination   Current Medications (verified): 1)  Amlodipine Besylate 5 Mg Tabs (Amlodipine Besylate) .Marland Kitchen.. 1 By Mouth Qd 2)  Maxzide-25 37.5-25 Mg Tabs (Triamterene-Hctz) .Marland Kitchen.. 1 By Mouth Qd 3)  Benazepril Hcl 40 Mg Tabs (Benazepril Hcl) .Marland Kitchen.. 1 By Mouth Qd 4)  Vitamin D3 1000 Unit  Tabs (Cholecalciferol) .Marland Kitchen.. 1 By Mouth T, Th, Sat 5)  Fish Oil   Oil (Fish Oil) .Marland Kitchen.. 1 By Mouth Two Times A Day 6)  Centrum Silver   Tabs (Multiple Vitamins-Minerals) .... Take 1 Tab By Mouth Every Other Day 7)  Aspir-Low 81 Mg Tbec (Aspirin) .Marland Kitchen.. 1 Once Daily After Meal 8)  Miralax   Powd (Polyethylene Glycol 3350) .Marland Kitchen.. 17g By Mouth Once Daily As Needed Constipation 9)  Tylenol Extra Strength 500 Mg Tabs (Acetaminophen) .... 2 By Mouth Once Daily As Needed 10)  Metamucil 30.9 % Powd (Psyllium) .... Daily As Directed 11)  Tramadol Hcl 50 Mg Tabs (Tramadol Hcl) .Marland Kitchen.. 1-2 Tabs By Mouth Two Times A Day As Needed Pain 12)  Vitamin C .... 1 By Mouth Every Other Day 13)  Calcium 600mg  .... 1 By Mouth Two Times A Day 14)  Magnesium 200 Mg .Marland Kitchen.. 1 By Mouth Two Times A Day 15)  Ra Turmeric 500 Mg Caps (Turmeric) .Marland Kitchen.. 1 By Mouth Two Times A Day 16)  Ibuprofen 200 Mg Tabs (Ibuprofen) .... Two Times A Day To Four Times A Day As Needed 17)  9 Gin Soaked Raisins .... Daily 18)  Glucosamine 500 Mg Caps (Glucosamine Sulfate) .... 2000 Daily  Allergies (verified): No Known Drug Allergies  Past History:  Past Medical History: Last  updated: 08/18/2010 Hypertension Colonic polyps, hx of  Dr Russella Dar L breast cyst 2008 SER Gyn Dr Huel [place] Constipation Low back pain ? spinal stenosis Osteoarthritis R hip severe OA 2011 Dr Darrick Penna  Family History: Last updated: 10/24/2007 Family History Hypertension Family History Breast cancer 1st degree relative  65 yo  Social History: Last updated: 10/24/2007 Occupation: running a day Care Married Never Smoked  Review of Systems  The patient denies fever, chest pain, syncope, abdominal pain, severe indigestion/heartburn, anorexia, weight loss, weight gain, vision loss, decreased hearing, hoarseness, dyspnea on exertion, peripheral edema, prolonged cough, headaches, hemoptysis, melena, hematochezia, hematuria, incontinence, genital sores, muscle weakness, suspicious skin lesions, transient blindness, difficulty walking, depression, unusual weight change, abnormal bleeding, enlarged lymph nodes, angioedema, and breast masses.    Physical Exam  General:  Well-developed,well-nourished,in no acute distress; alert,appropriate and cooperative throughout examination Head:  Normocephalic and atraumatic without obvious abnormalities. No apparent alopecia or balding. Eyes:  No corneal or conjunctival inflammation noted. EOMI. Perrla.  Ears:  R ear normal and L ear normal.   Nose:  External nasal examination shows no deformity or inflammation. Nasal mucosa are pink and moist without lesions or exudates. Mouth:  good dentition.   Neck:  No deformities, masses, or tenderness noted. Lungs:  Normal respiratory effort, chest expands symmetrically. Lungs are clear to auscultation, no crackles or wheezes. Heart:  Normal rate and regular rhythm. S1 and S2 normal without gallop, murmur, click, rub or other extra sounds. Abdomen:  Bowel sounds positive,abdomen soft and non-tender without masses, organomegaly or hernias noted. NT Msk:  Hips: Internal and external rotation on L side normal, with limited  internal rotation in particular on R side. Deficit of 10-15 degrees. Rull ROM with flexion and extension at hip bilaterally.  Weak hip flexion bilaterally (4+/5) but L stronger than R. Unable to do FABER on R, nml FABER on L. 4/5 hip abduction and adduction on R. Extremities:  No clubbing, cyanosis, edema, or deformity noted with normal full range of motion of all joints.   Neurologic:  Gait: Patient takes small steps, favoring L side. Externally rotates R foot. Skin:  Intact without suspicious lesions or rashes Psych:  Cognition and judgment appear intact. Alert and cooperative with normal attention span and concentration. No apparent delusions, illusions, hallucinations   Impression & Recommendations:  Problem # 1:  HIP PAIN (ICD-719.45) R Assessment Unchanged  Her updated medication list for this problem includes:    Aspir-low 81 Mg Tbec (Aspirin) .Marland Kitchen... 1 once daily after meal    Tylenol Extra Strength 500 Mg Tabs (Acetaminophen) .Marland Kitchen... 2 by mouth once daily as needed    Tramadol Hcl 50 Mg Tabs (Tramadol hcl) .Marland Kitchen... 1-2 tabs by mouth two times a day as needed pain    Ibuprofen 200 Mg Tabs (Ibuprofen) .Marland Kitchen..Marland Kitchen Two times a day to four times a day as needed  Orders: Orthopedic Referral (Ortho)  Problem # 2:  DEGENERATIVE JOINT DISEASE, RIGHT HIP (ICD-715.95) Assessment: Unchanged  Her updated medication list for this problem includes:    Aspir-low 81 Mg Tbec (Aspirin) .Marland Kitchen... 1 once daily after meal    Tylenol Extra Strength 500 Mg Tabs (Acetaminophen) .Marland Kitchen... 2 by mouth once daily as needed    Tramadol Hcl 50 Mg Tabs (Tramadol hcl) .Marland Kitchen... 1-2 tabs by mouth two times a day as needed pain    Ibuprofen 200 Mg Tabs (Ibuprofen) .Marland Kitchen..Marland Kitchen Two times a day to four times a day as needed  Orders: Orthopedic Referral (Ortho)  Problem # 3:  LOW BACK PAIN (ICD-724.2) Assessment: Unchanged  Her updated medication list for this problem includes:    Aspir-low 81 Mg Tbec (Aspirin) .Marland Kitchen... 1 once daily after  meal    Tylenol Extra Strength 500 Mg Tabs (Acetaminophen) .Marland Kitchen... 2 by mouth once daily as needed    Tramadol Hcl 50 Mg Tabs (Tramadol hcl) .Marland Kitchen... 1-2 tabs by mouth two times a day as needed pain    Ibuprofen 200 Mg Tabs (Ibuprofen) .Marland Kitchen..Marland Kitchen Two times a day to four times a day as needed  Orders: Orthopedic Referral (Ortho)  Problem # 4:  HYPERTENSION (ICD-401.9) Assessment: Unchanged  Her updated medication list for this problem includes:    Amlodipine Besylate 5 Mg Tabs (Amlodipine besylate) .Marland Kitchen... 1 by mouth qd    Maxzide-25 37.5-25 Mg Tabs (Triamterene-hctz) .Marland Kitchen... 1 by mouth qd    Benazepril Hcl 40 Mg Tabs (Benazepril hcl) .Marland Kitchen... 1 by mouth qd  BP today: 142/80 Prior BP: 148/82 (08/18/2010)  Labs Reviewed: K+: 4.1 (11/10/2010) Creat: : 0.8 (11/10/2010)   Chol: 182 (10/20/2009)   HDL: 41.10 (10/20/2009)   LDL: 105 (10/20/2009)   TG: 182.0 (10/20/2009)  Problem # 5:  HEALTH MAINTENANCE  EXAM (ICD-V70.0) Colon Dr Glena Norfolk and PAP 2011 Health and age related issues were discussed. Available screening tests and vaccinations were discussed as well. Healthy life style including good diet and exercise was discussed. The labs were reviewed with the patient and more ordered  Complete Medication List: 1)  Amlodipine Besylate 5 Mg Tabs (Amlodipine besylate) .Marland Kitchen.. 1 by mouth qd 2)  Maxzide-25 37.5-25 Mg Tabs (Triamterene-hctz) .Marland Kitchen.. 1 by mouth qd 3)  Benazepril Hcl 40 Mg Tabs (Benazepril hcl) .Marland Kitchen.. 1 by mouth qd 4)  Vitamin D3 1000 Unit Tabs (Cholecalciferol) .Marland Kitchen.. 1 by mouth t, th, sat 5)  Fish Oil Oil (Fish oil) .Marland Kitchen.. 1 by mouth two times a day 6)  Centrum Silver Tabs (Multiple vitamins-minerals) .... Take 1 tab by mouth every other day 7)  Aspir-low 81 Mg Tbec (Aspirin) .Marland Kitchen.. 1 once daily after meal 8)  Miralax Powd (Polyethylene glycol 3350) .Marland Kitchen.. 17g by mouth once daily as needed constipation 9)  Tylenol Extra Strength 500 Mg Tabs (Acetaminophen) .... 2 by mouth once daily as needed 10)   Metamucil 30.9 % Powd (Psyllium) .... Daily as directed 11)  Tramadol Hcl 50 Mg Tabs (Tramadol hcl) .Marland Kitchen.. 1-2 tabs by mouth two times a day as needed pain 12)  Vitamin C  .... 1 by mouth every other day 13)  Calcium 600mg   .... 1 by mouth two times a day 14)  Magnesium 200 Mg  .Marland KitchenMarland Kitchen. 1 by mouth two times a day 15)  Ra Turmeric 500 Mg Caps (Turmeric) .Marland Kitchen.. 1 by mouth two times a day 16)  Ibuprofen 200 Mg Tabs (Ibuprofen) .... Two times a day to four times a day as needed 17)  9 Gin Soaked Raisins  .... Daily 18)  Glucosamine 500 Mg Caps (Glucosamine sulfate) .... 2000 daily  Other Orders: Tdap => 40yrs IM (16109) Admin 1st Vaccine (60454)  Patient Instructions: 1)  Please schedule a follow-up appointment in 3-4 months. 2)  Lipid Panel prior to visit, ICD-9: 3)  CBC w/ Diff prior to visit, ICD-9: 4)  Urine-dip prior to visit, ICD-9:v70.0 5)  BMP prior to visit, ICD-9: Prescriptions: TRAMADOL HCL 50 MG TABS (TRAMADOL HCL) 1-2 tabs by mouth two times a day as needed pain  #120 x 6   Entered and Authorized by:   Tresa Garter MD   Signed by:   Tresa Garter MD on 11/19/2010   Method used:   Electronically to        [facility] [address] #[id number]* (retail)       [address] [place], [place]  [address]       Ph: [phone]       Fax: [phone]   RxID:   [id number] AMLODIPINE BESYLATE 5 MG TABS (AMLODIPINE BESYLATE) 1 by mouth qd  #30 x 12   Entered and Authorized by:   Tresa Garter MD   Signed by:   Tresa Garter MD on 11/19/2010   Method used:   Electronically to        [facility] [address] #[id number]* (retail)       [address] [place], [place]  [address]       Ph: [phone]       Fax: [phone]   RxID:   [id number] BENAZEPRIL HCL 40 MG TABS (BENAZEPRIL HCL) 1 by mouth qd  #30 x 12   Entered and Authorized by:   Macarthur Critchley  Buckner Malta MD   Signed by:   Tresa Garter MD on 11/19/2010   Method used:   Electronically to         [facility] #[id number]* (retail)       [address] [place], [place]  [address]       Ph: [phone]       Fax: [phone]   RxID:   [id number] MAXZIDE-25 37.5-25 MG TABS (TRIAMTERENE-HCTZ) 1 by mouth qd  #30 x 12   Entered and Authorized by:   Tresa Garter MD   Signed by:   Tresa Garter MD on 11/19/2010   Method used:   Electronically to        [facility] #[id number]* (retail)       [address] [place], [place]  [address]       Ph: [phone]       Fax: [phone]   RxID:   [id number]    Orders Added: 1)  Orthopedic Referral [Ortho] 2)  Est. Patient age 96-64 [84] 3)  Tdap => 66yrs IM [90715] 4)  Admin 1st Vaccine [90471]   Immunizations Administered:  Tetanus Vaccine:    Vaccine Type: Tdap    Site: right deltoid    Mfr: GlaxoSmithKline    Dose: 0.5 ml    Route: IM    Given by: Ami Bullins CMA    Lot #: [id number]    VIS given: 08/13/08 version given November 19, 2010.   Contraindications/Deferment of Procedures/Staging:    Test/Procedure: FLU VAX    Reason for deferment: patient declined   Immunizations Administered:  Tetanus Vaccine:    Vaccine Type: Tdap    Site: right deltoid    Mfr: GlaxoSmithKline    Dose: 0.5 ml    Route: IM    Given by: Ami Bullins CMA    Lot #: [id number]    VIS given: 08/13/08 version given November 19, 2010.

## 2011-02-07 ENCOUNTER — Telehealth: Payer: Self-pay | Admitting: *Deleted

## 2011-02-07 DIAGNOSIS — I10 Essential (primary) hypertension: Secondary | ICD-10-CM

## 2011-02-07 DIAGNOSIS — Z0000 Encounter for general adult medical examination without abnormal findings: Secondary | ICD-10-CM

## 2011-02-07 NOTE — Telephone Encounter (Signed)
Patient requesting a call back regarding her apts.

## 2011-02-08 NOTE — Telephone Encounter (Signed)
Spoke w/pt - explained that Feb apt was coded as CPX and she is to have labs prior to next f/u in June. Pt will need surgical clearance for hip replacement surgery scheduled for July 16 th. I advised her that during the f/u OV in June should take care of this, correct? ALSO, see below, does pt need any other labs considering she will need this clearance?   Lipid Panel prior to visit, ICD-9: 3)  CBC w/ Diff prior to visit, ICD-9: 4)  Urine-dip prior to visit, ICD-9:v70.0 5)  BMP prior to visit, ICD-9:

## 2011-02-10 NOTE — Telephone Encounter (Signed)
Either way not a problem - we can code as " preop consult" later if needed Thx

## 2011-02-10 NOTE — Telephone Encounter (Signed)
Any other labs needed prior to June apt beside what is below?   Lipid Panel prior to visit, ICD-9:  3) CBC w/ Diff prior to visit, ICD-9:  4) Urine-dip prior to visit, ICD-9:v70.0  5) BMP prior to visit, ICD-9:

## 2011-02-11 NOTE — Assessment & Plan Note (Signed)
[facility]                           PRIMARY CARE OFFICE NOTE   Alexandra Scott, Alexandra Scott                    MRN:          [id number]  DATE:10/16/2006                            DOB:          [birth year]    The patient is a 65 year old female who presents for a wellness  examination.   ALLERGIES:  None.   Past medical history, family history, and social history as per October 10, 2005 note. 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   CURRENT MEDICATIONS:  Reviewed.   ALLERGIES:  None.   REVIEW OF SYSTEMS:  No chest pain or shortness of breath. No syncope, no  neurologic complaints. The rest of the 18-point review of systems is  negative.   PHYSICAL EXAMINATION:  VITAL SIGNS:  Blood pressure 126/78, pulse 89,  temperature 97.7, weight 160 pounds.  GENERAL:  Looks well.  HEENT:  Moist mucosa.  NECK:  Supple, no thyromegaly or bruit.  LUNGS:  Clear to auscultation and percussion. No wheezes or rales.  HEART:  S1, S2, no murmur, no gallop.  ABDOMEN:  Soft, nontender, no organomegaly, no masses felt.  EXTREMITIES:  Lower extremities are without edema.  NEUROLOGIC:  She is alert, oriented and cooperative. Denies being  depressed.  SKIN:  Clear.   LABORATORY DATA:  October 09, 2006, CBC normal, cholesterol 172, LDL  100, glucose 104, CMET normal, TSH normal, urinalysis normal.   EKG today with normal sinus rhythm.   ASSESSMENT/PLAN:  1. Normal wellness examination. Age/health-related issues  discussed. Healthy lifestyle discussed. Regular gynecologic care with  her gynecologist. She has had a breast ultrasound and mammogram and will  be colonoscopy in 2011. Obtain chest x-ray today. Asked to discontinue  vitamin E. Continue with a baby aspirin daily. Mediterranean diet.  She has refused all vaccinations.  1. Hypertension on current therapy, continue unchanged.  2. Osteoarthritis. Ibuprofen  400 p.o. b.i.d.     Georgina Quint. Plotnikov, MD  Electronically Signed    AVP/MedQ  DD: 11/02/2006  DT: 11/02/2006  Job #: [id number]

## 2011-02-11 NOTE — Telephone Encounter (Signed)
OK as is. Thx

## 2011-02-14 NOTE — Telephone Encounter (Signed)
Labs ordered.

## 2011-02-28 ENCOUNTER — Other Ambulatory Visit: Payer: Self-pay | Admitting: Gynecology

## 2011-03-16 ENCOUNTER — Other Ambulatory Visit (INDEPENDENT_AMBULATORY_CARE_PROVIDER_SITE_OTHER): Payer: PRIVATE HEALTH INSURANCE

## 2011-03-16 ENCOUNTER — Other Ambulatory Visit: Payer: Self-pay | Admitting: Internal Medicine

## 2011-03-16 DIAGNOSIS — Z0000 Encounter for general adult medical examination without abnormal findings: Secondary | ICD-10-CM

## 2011-03-16 DIAGNOSIS — I10 Essential (primary) hypertension: Secondary | ICD-10-CM

## 2011-03-16 LAB — URINALYSIS, ROUTINE W REFLEX MICROSCOPIC
Ketones, ur: NEGATIVE
Specific Gravity, Urine: 1.01 (ref 1.000–1.030)
Total Protein, Urine: NEGATIVE
pH: 7 (ref 5.0–8.0)

## 2011-03-16 LAB — BASIC METABOLIC PANEL
BUN: 24 mg/dL — ABNORMAL HIGH (ref 6–23)
CO2: 29 mEq/L (ref 19–32)
Chloride: 101 mEq/L (ref 96–112)
GFR: 69.54 mL/min (ref >60.00)
Glucose, Bld: 89 mg/dL (ref 70–99)
Potassium: 4.5 mEq/L (ref 3.5–5.1)
Sodium: 137 mEq/L (ref 135–145)

## 2011-03-16 LAB — CBC WITH DIFFERENTIAL/PLATELET
Basophils Relative: 0.3 % (ref 0.0–3.0)
Eosinophils Relative: 2.6 % (ref 0.0–5.0)
HCT: 35.9 % — ABNORMAL LOW (ref 36.0–46.0)
Hemoglobin: 12.3 g/dL (ref 12.0–15.0)
Lymphs Abs: 2.3 10*3/uL (ref 0.7–4.0)
Monocytes Relative: 9.1 % (ref 3.0–12.0)
Neutro Abs: 6.4 10*3/uL (ref 1.4–7.7)
RBC: 3.93 Mil/uL (ref 3.87–5.11)
RDW: 13 % (ref 11.5–14.6)
WBC: 9.9 10*3/uL (ref 4.5–10.5)

## 2011-03-16 LAB — LIPID PANEL
Total CHOL/HDL Ratio: 4
Triglycerides: 131 mg/dL (ref 0.0–149.0)

## 2011-03-23 ENCOUNTER — Encounter: Payer: Self-pay | Admitting: Internal Medicine

## 2011-03-25 ENCOUNTER — Encounter: Payer: Self-pay | Admitting: Internal Medicine

## 2011-03-25 ENCOUNTER — Ambulatory Visit (INDEPENDENT_AMBULATORY_CARE_PROVIDER_SITE_OTHER): Payer: PRIVATE HEALTH INSURANCE | Admitting: Internal Medicine

## 2011-03-25 ENCOUNTER — Ambulatory Visit (INDEPENDENT_AMBULATORY_CARE_PROVIDER_SITE_OTHER)
Admission: RE | Admit: 2011-03-25 | Discharge: 2011-03-25 | Disposition: A | Discharge status: Home / Self Care | Payer: PRIVATE HEALTH INSURANCE | Source: Ambulatory Visit | Attending: Internal Medicine | Admitting: Internal Medicine

## 2011-03-25 VITALS — BP 140/80 | HR 76 | Temp 99.5°F | Resp 16 | Ht 60.0 in | Wt 155.0 lb

## 2011-03-25 DIAGNOSIS — M25559 Pain in unspecified hip: Secondary | ICD-10-CM

## 2011-03-25 DIAGNOSIS — Z01818 Encounter for other preprocedural examination: Secondary | ICD-10-CM

## 2011-03-25 MED ORDER — CIPROFLOXACIN HCL 250 MG PO TABS: 250.0000 mg | ORAL_TABLET | Freq: Two times a day (BID) | ORAL | Status: AC

## 2011-03-25 NOTE — Assessment & Plan Note (Signed)
She is medically clear for her hip surgery. Her EKG is nl. I ordered a CXR. Labs were OK Thank you!

## 2011-03-25 NOTE — Progress Notes (Signed)
Subjective:    Patient ID: Alexandra Scott, female    DOB: 1945/10/21, 65 y.o.   MRN: [id number]  HPI  IM Consult Requested by Dr Lequita Halt Reason: Med clearance for R THR on 04-06-11 The patient presents for a follow-up of  chronic hypertension, chronic dyslipidemia, OA controlled with medicines, stable   Past Medical History  Diagnosis Date  . HTN (hypertension)   . Colon polyps   . Breast cyst 2008    Left / SER  . Constipation   . LBP (low back pain)     ? Spinal stenosis  . Osteoarthritis   . Osteoarthritis of hip 2011    Right/Dr Fields   History reviewed. No pertinent past surgical history.  reports that she has never smoked. She does not have any smokeless tobacco history on file. She reports that she does not drink alcohol or use illicit drugs. family history includes Breast cancer in her other and Hypertension in her other. No Known Allergies Current Outpatient Prescriptions on File Prior to Visit  Medication Sig Dispense Refill  . aspirin 81 MG EC tablet Take 81 mg by mouth daily.        . benazepril (LOTENSIN) 40 MG tablet Take 40 mg by mouth daily.        . calcium carbonate (OS-CAL) 600 MG TABS Take 600 mg by mouth 2 (two) times daily.        . Cholecalciferol 1000 UNITS tablet Take 1,000 Units by mouth every other day. Tues, Thurs & Sat      . fish oil-omega-3 fatty acids 1000 MG capsule Take 1 g by mouth daily.        . Glucosamine 500 MG CAPS Take 2,000 mg by mouth daily.        Marland Kitchen ibuprofen (ADVIL,MOTRIN) 200 MG tablet Take 200 mg by mouth 4 (four) times daily as needed.        . Magnesium 200 MG TABS Take by mouth 2 (two) times daily.        . Multiple Vitamins-Minerals (CENTRUM SILVER PO) Take by mouth daily.        . NON FORMULARY 9 Gin soaked raisins daily       . psyllium (METAMUCIL) 58.6 % packet Take 1 packet by mouth daily.        . traMADol (ULTRAM) 50 MG tablet Take 50-100 mg by mouth 2 (two) times daily as needed.        .  triamterene-hydrochlorothiazide (MAXZIDE-25) 37.5-25 MG per tablet Take 1 tablet by mouth daily.        . Turmeric 500 MG CAPS Take by mouth daily.        Marland Kitchen acetaminophen (TYLENOL) 500 MG tablet Take 1,000 mg by mouth daily as needed.        Marland Kitchen amLODipine (NORVASC) 5 MG tablet Take 5 mg by mouth daily.        . Ascorbic Acid (VITAMIN C) 100 MG tablet Take 100 mg by mouth daily.        . polyethylene glycol powder (GLYCOLAX/MIRALAX) powder Take 17 g by mouth daily.         BP 140/80  Pulse 76  Temp(Src) 99.5 F (37.5 C) (Oral)  Resp 16  Ht 5' (1.524 m)  Wt 155 lb (70.308 kg)  BMI 30.27 kg/m2  Review of Systems  Constitutional: Negative for fever, chills, diaphoresis, activity change, appetite change, fatigue and unexpected weight change.  HENT: Negative for hearing loss, ear pain, congestion,  sore throat, sneezing, mouth sores, neck pain, dental problem, voice change, postnasal drip and sinus pressure.   Eyes: Negative for pain and visual disturbance.  Respiratory: Negative for cough, chest tightness, wheezing and stridor.   Cardiovascular: Negative for chest pain, palpitations and leg swelling.  Gastrointestinal: Negative for nausea, vomiting, abdominal pain, blood in stool, abdominal distention and rectal pain.  Genitourinary: Negative for dysuria, hematuria, decreased urine volume, vaginal bleeding, vaginal discharge, difficulty urinating, vaginal pain and menstrual problem.  Musculoskeletal: Positive for back pain, arthralgias and gait problem. Negative for joint swelling.  Skin: Negative for color change, rash and wound.  Neurological: Negative for dizziness, tremors, syncope, speech difficulty and light-headedness.  Hematological: Negative for adenopathy.  Psychiatric/Behavioral: Negative for suicidal ideas, hallucinations, behavioral problems, confusion, sleep disturbance, dysphoric mood and decreased concentration. The patient is not hyperactive.        Objective:   Physical  Exam  Constitutional: She appears well-developed and well-nourished. No distress.  HENT:  Head: Normocephalic.  Right Ear: External ear normal.  Left Ear: External ear normal.  Nose: Nose normal.  Mouth/Throat: Oropharynx is clear and moist.  Eyes: Conjunctivae are normal. Pupils are equal, round, and reactive to light. Right eye exhibits no discharge. Left eye exhibits no discharge.  Neck: Normal range of motion. Neck supple. No JVD present. No tracheal deviation present. No thyromegaly present.  Cardiovascular: Normal rate, regular rhythm and normal heart sounds.   Pulmonary/Chest: No stridor. No respiratory distress. She has no wheezes.  Abdominal: Soft. Bowel sounds are normal. She exhibits no distension and no mass. There is no tenderness. There is no rebound and no guarding.  Musculoskeletal: She exhibits tenderness (R hip is tender). She exhibits no edema.       Using a cane  Lymphadenopathy:    She has no cervical adenopathy.  Neurological: She displays normal reflexes. No cranial nerve deficit. She exhibits normal muscle tone. Coordination normal.  Skin: No rash noted. No erythema.  Psychiatric: She has a normal mood and affect. Her behavior is normal. Judgment and thought content normal.   Wt Readings from Last 3 Encounters:  03/25/11 155 lb (70.308 kg)  11/19/10 158 lb (71.668 kg)  08/18/10 154 lb (69.854 kg)   Lab Results  Component Value Date   WBC 9.9 03/16/2011   HGB 12.3 03/16/2011   HCT 35.9* 03/16/2011   PLT 263.0 03/16/2011   CHOL 184 03/16/2011   TRIG 131.0 03/16/2011   HDL 41.80 03/16/2011   LDLDIRECT 150.6 10/15/2007   ALT 22 11/10/2010   AST 25 11/10/2010   NA 137 03/16/2011   K 4.5 03/16/2011   CL 101 03/16/2011   CREATININE 0.9 03/16/2011   BUN 24* 03/16/2011   CO2 29 03/16/2011   TSH 2.93 10/20/2009           Assessment & Plan:

## 2011-03-27 HISTORY — PX: JOINT REPLACEMENT: SHX530

## 2011-03-28 ENCOUNTER — Telehealth: Payer: Self-pay | Admitting: *Deleted

## 2011-03-28 NOTE — Telephone Encounter (Signed)
Pt has pre-opt apt Tuesday. She would like results of CXR sent to Dr Despina Hick at [facility]. I sent results.

## 2011-03-29 ENCOUNTER — Other Ambulatory Visit: Payer: Self-pay | Admitting: Orthopedic Surgery

## 2011-03-29 ENCOUNTER — Encounter (HOSPITAL_COMMUNITY): Payer: PRIVATE HEALTH INSURANCE

## 2011-03-29 ENCOUNTER — Ambulatory Visit (HOSPITAL_COMMUNITY)
Admission: RE | Admit: 2011-03-29 | Discharge: 2011-03-29 | Disposition: A | Discharge status: Home / Self Care | Payer: PRIVATE HEALTH INSURANCE | Source: Ambulatory Visit | Attending: Orthopedic Surgery | Admitting: Orthopedic Surgery

## 2011-03-29 ENCOUNTER — Other Ambulatory Visit (HOSPITAL_COMMUNITY): Payer: Self-pay | Admitting: Orthopedic Surgery

## 2011-03-29 DIAGNOSIS — Z01818 Encounter for other preprocedural examination: Secondary | ICD-10-CM

## 2011-03-29 DIAGNOSIS — M25559 Pain in unspecified hip: Secondary | ICD-10-CM | POA: Insufficient documentation

## 2011-03-29 DIAGNOSIS — M1710 Unilateral primary osteoarthritis, unspecified knee: Secondary | ICD-10-CM | POA: Insufficient documentation

## 2011-03-29 DIAGNOSIS — Z01812 Encounter for preprocedural laboratory examination: Secondary | ICD-10-CM | POA: Insufficient documentation

## 2011-03-29 LAB — COMPREHENSIVE METABOLIC PANEL
ALT: 19 U/L (ref 0–35)
AST: 21 U/L (ref 0–37)
Alkaline Phosphatase: 82 U/L (ref 39–117)
CO2: 29 mEq/L (ref 19–32)
GFR calc Af Amer: >60 mL/min (ref >60)
GFR calc non Af Amer: >60 mL/min (ref >60)
Glucose, Bld: 86 mg/dL (ref 70–99)
Potassium: 3.8 mEq/L (ref 3.5–5.1)
Sodium: 136 mEq/L (ref 135–145)

## 2011-03-29 LAB — URINALYSIS, ROUTINE W REFLEX MICROSCOPIC
Bilirubin Urine: NEGATIVE
Nitrite: NEGATIVE
Specific Gravity, Urine: 1.018 (ref 1.005–1.030)
Urobilinogen, UA: 0.2 mg/dL (ref 0.0–1.0)
pH: 6.5 (ref 5.0–8.0)

## 2011-03-29 LAB — CBC
MCV: 90.4 fL (ref 78.0–100.0)
Platelets: 279 10*3/uL (ref 150–400)
RBC: 4.15 MIL/uL (ref 3.87–5.11)
RDW: 12.7 % (ref 11.5–15.5)
WBC: 9.9 10*3/uL (ref 4.0–10.5)

## 2011-03-29 LAB — URINE MICROSCOPIC-ADD ON

## 2011-03-29 LAB — PROTIME-INR
INR: 0.97 (ref 0.00–1.49)
Prothrombin Time: 13.1 seconds (ref 11.6–15.2)

## 2011-04-06 ENCOUNTER — Inpatient Hospital Stay (HOSPITAL_COMMUNITY)
Admission: RE | Admit: 2011-04-06 | Discharge: 2011-04-10 | DRG: 470 | Disposition: A | Discharge status: Home Health | Payer: PRIVATE HEALTH INSURANCE | Source: Ambulatory Visit | Attending: Orthopedic Surgery | Admitting: Orthopedic Surgery

## 2011-04-06 ENCOUNTER — Inpatient Hospital Stay (HOSPITAL_COMMUNITY): Payer: PRIVATE HEALTH INSURANCE

## 2011-04-06 DIAGNOSIS — M169 Osteoarthritis of hip, unspecified: Principal | ICD-10-CM | POA: Diagnosis present

## 2011-04-06 DIAGNOSIS — Z01812 Encounter for preprocedural laboratory examination: Secondary | ICD-10-CM

## 2011-04-06 DIAGNOSIS — M1610 Unilateral primary osteoarthritis, unspecified hip: Principal | ICD-10-CM | POA: Diagnosis present

## 2011-04-06 DIAGNOSIS — D649 Anemia, unspecified: Secondary | ICD-10-CM | POA: Diagnosis not present

## 2011-04-06 DIAGNOSIS — E871 Hypo-osmolality and hyponatremia: Secondary | ICD-10-CM | POA: Diagnosis not present

## 2011-04-06 DIAGNOSIS — I10 Essential (primary) hypertension: Secondary | ICD-10-CM | POA: Diagnosis present

## 2011-04-06 LAB — URINALYSIS, ROUTINE W REFLEX MICROSCOPIC
Bilirubin Urine: NEGATIVE
Leukocytes, UA: NEGATIVE
Nitrite: NEGATIVE
Specific Gravity, Urine: 1.016 (ref 1.005–1.030)
pH: 7 (ref 5.0–8.0)

## 2011-04-06 LAB — TYPE AND SCREEN
ABO/RH(D): A POS
Antibody Screen: NEGATIVE

## 2011-04-06 NOTE — Op Note (Signed)
NAMEARDYN, FORGE NO.:  [id number]  MEDICAL RECORD NO.:  [id number]  LOCATION:  0002                         FACILITY:  [facility]  PHYSICIAN:  Ollen Gross, M.D.    DATE OF BIRTH:  04-Aug-1946  DATE OF PROCEDURE:  04/06/2011 DATE OF DISCHARGE:                              OPERATIVE REPORT   PREOPERATIVE DIAGNOSIS:  Osteoarthritis, right hip.  POSTOPERATIVE DIAGNOSIS:  Osteoarthritis, right hip.  PROCEDURE:  Right total hip arthroplasty.  SURGEON:  Ollen Gross, M.D.  ASSISTANT:  Alexzandrew L. Perkins, P.A.C.  ANESTHESIA:  Spinal.  ESTIMATED BLOOD LOSS:  450.  DRAINS:  Hemovac x1.  COMPLICATIONS:  None.  CONDITION:  Stable to recovery.  BRIEF CLINICAL NOTE:  Ms. Schaffert is a 65 year old female with advanced end-stage arthritis of the right hip with progressively worsening pain and dysfunction.  She has failed nonoperative management and presents now for right total hip arthroplasty.  PROCEDURE IN DETAIL:  After successful administration of spinal anesthetic, the patient is placed in left lateral decubitus position with the right side up and held with the hip positioner.  Right lower extremity is isolated from perineum with plastic drapes and prepped and draped in the usual sterile fashion.  Short posterolateral incision was made with 10 blade through subcutaneous tissue to the level of fascia lata which was incised in line with the skin incision.  Sciatic nerve was palpated and protected and short rotators and capsule isolated off the femur.  Hip was dislocated and the center of femoral head is marked. A trial prosthesis is placed such that the center of the trial head corresponds to the center of native femoral head.  Osteotomy lines marked on the femoral neck and osteotomy made with an oscillating saw. The femoral head removed.  Femoral retractors were placed to gain access to the proximal femur.  A starter reamer was passed into the femoral  canal.  Canal was thoroughly irrigated to remove fatty contents.  Axial reaming is performed to 11.5 mm, proximal reaming to a 16D and the sleeve machined to a small.  16D small trial sleeve is placed.  Femur was retracted anteriorly to gain acetabular exposure.  Acetabular retractors were placed and labrum and osteophytes removed.  Acetabular reaming is performed up to 51 mm for placement of 52 mm pinnacle acetabular shell.  It was placed in anatomic position and impacted with excellent purchase.  We did not place additional dome screws.  The apex hole eliminator is placed in the 32 mm neutral plus 4 marathon liner was placed.  We placed the trial stem which was 16 x 11 to 36 plus 6 neck, matching native anteversion.  32 plus 0 head is placed and the hips reduced with outstanding stability.  There is full extension, full external rotation, 70 degrees flexion, 40 degrees adduction, 90 degrees internal rotation and 9 degrees of flexion and 70 degrees of internal rotation.  By placing the right leg on top of the left, it felt as though leg lengths were equal.  The hip was dislocated and trials removed.  Permanent 16D, small sleeve was placed with a 16 x 11 stem, 36 plus 6 neck, matching native anteversion.  The  32 plus 0 ceramic head is placed and the hips reduced with the same stability parameters.  The wound was copiously irrigated with saline solution and short rotators and capsule reattached to the femur through drill holes with Ethibond suture.  Fascia lata was closed over Hemovac drain with interrupted #1 Vicryl, subcu closed with #1-0 and #2-0 Vicryl and subcuticular running 4-0 Monocryl.  The incision was then cleaned and dried and Steri-Strips applied.  Catheter for Marcaine pain pump was placed and pumps initiated.  Bulky sterile dressing was applied and she is placed into a knee immobilizer, awakened and transported to recovery in stable condition.     Ollen Gross,  M.D.     FA/MEDQ  D:  04/06/2011  T:  04/06/2011  Job:  [id number]  Electronically Signed by Ollen Gross M.D. on 04/06/2011 12:33:46 PM

## 2011-04-07 LAB — CBC
HCT: 26.4 % — ABNORMAL LOW (ref 36.0–46.0)
Hemoglobin: 8.9 g/dL — ABNORMAL LOW (ref 12.0–15.0)
MCH: 30.3 pg (ref 26.0–34.0)
MCHC: 33.7 g/dL (ref 30.0–36.0)
RBC: 2.94 MIL/uL — ABNORMAL LOW (ref 3.87–5.11)

## 2011-04-07 LAB — BASIC METABOLIC PANEL
BUN: 8 mg/dL (ref 6–23)
CO2: 29 mEq/L (ref 19–32)
Calcium: 8.3 mg/dL — ABNORMAL LOW (ref 8.4–10.5)
GFR calc non Af Amer: >60 mL/min (ref >60)
Glucose, Bld: 112 mg/dL — ABNORMAL HIGH (ref 70–99)
Potassium: 3.9 mEq/L (ref 3.5–5.1)
Sodium: 130 mEq/L — ABNORMAL LOW (ref 135–145)

## 2011-04-08 LAB — BASIC METABOLIC PANEL
CO2: 29 mEq/L (ref 19–32)
Calcium: 8.3 mg/dL — ABNORMAL LOW (ref 8.4–10.5)
Creatinine, Ser: 0.5 mg/dL (ref 0.50–1.10)
GFR calc non Af Amer: >60 mL/min (ref >60)
Glucose, Bld: 116 mg/dL — ABNORMAL HIGH (ref 70–99)

## 2011-04-08 LAB — CBC
Hemoglobin: 8.8 g/dL — ABNORMAL LOW (ref 12.0–15.0)
MCH: 30.1 pg (ref 26.0–34.0)
MCHC: 34 g/dL (ref 30.0–36.0)
MCV: 88.7 fL (ref 78.0–100.0)
RBC: 2.92 MIL/uL — ABNORMAL LOW (ref 3.87–5.11)

## 2011-04-09 LAB — CBC
MCH: 30.2 pg (ref 26.0–34.0)
MCHC: 34 g/dL (ref 30.0–36.0)
MCV: 88.8 fL (ref 78.0–100.0)
Platelets: 215 10*3/uL (ref 150–400)
RDW: 12.6 % (ref 11.5–15.5)

## 2011-04-09 LAB — BASIC METABOLIC PANEL
Calcium: 8.8 mg/dL (ref 8.4–10.5)
Creatinine, Ser: 0.64 mg/dL (ref 0.50–1.10)
GFR calc Af Amer: >60 mL/min (ref >60)
GFR calc non Af Amer: >60 mL/min (ref >60)
Sodium: 130 mEq/L — ABNORMAL LOW (ref 135–145)

## 2011-04-10 LAB — BASIC METABOLIC PANEL
Chloride: 96 mEq/L (ref 96–112)
Creatinine, Ser: 0.65 mg/dL (ref 0.50–1.10)
GFR calc Af Amer: >60 mL/min (ref >60)
Sodium: 131 mEq/L — ABNORMAL LOW (ref 135–145)

## 2011-04-10 LAB — CBC
MCV: 89.7 fL (ref 78.0–100.0)
Platelets: 259 10*3/uL (ref 150–400)
RDW: 13 % (ref 11.5–15.5)
WBC: 9.8 10*3/uL (ref 4.0–10.5)

## 2011-04-11 NOTE — H&P (Signed)
[name], Scott NO.:  [id number]  MEDICAL RECORD NO.:  [id number]  LOCATION:  0002                         FACILITY:  [facility]  PHYSICIAN:  Ollen Gross, M.D.    DATE OF BIRTH:  Jul 16, 1946  DATE OF ADMISSION:  04/06/2011 DATE OF DISCHARGE:                             HISTORY & PHYSICAL   CHIEF COMPLAINT:  Right hip pain.  HISTORY OF PRESENT ILLNESS:  The patient is a 65 year old female who has been seen by Dr. Lequita Halt for right hip pain.  The hip pain has been progressively getting worse over the past several years.  It is limiting what she can and cannot do.  She has been seen in the office where x- rays showed end-stage arthritis, bone-on-bone with absolutely no joint space left.  It is felt she would benefit from undergoing surgical intervention.  Risks and benefits have been discussed.  She elects to proceed with surgery.  She has been seen preoperatively by Dr. Posey Rea and felt to be stable for upcoming procedure.  ALLERGIES:  No known drug allergies.  INTOLERANCES:  PREDNISONE causes agitation.  CURRENT MEDICATIONS:  Baby aspirin, benazepril, calcium supplement, omega 3 fish oil, glucosamine, ibuprofen, magnesium, multivitamin, psyllium, Ultram, triamterene/hydrochlorothiazide, generic amlodipine, vitamin C, and MiraLax.  PAST MEDICAL HISTORY:  Hypertension, history of colonic polyps, history of diverticulosis, osteoarthritis.  Childhood illnesses of measles, mumps and rubella.  PAST SURGICAL HISTORY:  Colonoscopy.  FAMILY HISTORY:  Father deceased at 40 of lung cancer.  Mother deceased at 16 with heart disease.  SOCIAL HISTORY:  Married, past smoker.  Her husband will be assisting with care after surgery.  She has three steps entering her home.  REVIEW OF SYSTEMS:  GENERAL:  No fevers, chills or night sweats.  NEURO: She does have a little bit ringing in her ears/tinnitus.  No seizures, syncope or paralysis.  RESPIRATORY:  No shortness  breath, productive cough, or hemoptysis.  CARDIOVASCULAR:  No chest pain, no angina, no orthopnea. GI:  Some constipation.  No nausea, vomiting, diarrhea.  GU: No dysuria, hematuria, or discharge.  MUSCULOSKELETAL:  Hip pain.  PHYSICAL EXAMINATION:  VITAL SIGNS:  Pulse 98, respirations 16, blood pressure 138/78. GENERAL:  A 65 year old white female, well nourished, well developed, no acute distress.  She is alert, oriented, cooperative, pleasant at time of exam. HEENT:  Normocephalic, atraumatic.  Pupils round and reactive.  EOMs intact.  She does have reading glasses. NECK:  Supple. CHEST:  Clear. HEART:  Regular rate and rhythm without murmur.  S1, S2 noted. ABDOMEN:  Soft, nontender.  Bowel sounds present. RECTAL:  Not done, not pertinent to present illness. BREASTS:  Not done, not pertinent to present illness. GENITALIA:  Not done, not pertinent to present illness. EXTREMITIES:  Right hip shows flexion 90, 0 internal rotation, about 20 degrees external rotation, about 30 degrees abduction.  Right knee shows no effusion.  Range of motion 0 to 125, slight crepitus, no tenderness, no instability.  IMPRESSION:  Osteoarthritis, right hip.  PLAN:  The patient admitted to [facility] to undergo right total hip replacement arthroplasty.  Surgery will be performed by Dr. Ollen Gross.     [name], P.A.C.  ______________________________ Ollen Gross, M.D.    ALP/MEDQ  D:  04/06/2011  T:  04/06/2011  Job:  [id number]  cc:   Georgina Quint. Plotnikov, MD [address] [place] [address]  Electronically Signed by Patrica Duel P.A.C. on 04/07/2011 10:42:39 AM Electronically Signed by Ollen Gross M.D. on 04/11/2011 06:52:02 AM

## 2011-05-09 NOTE — Discharge Summary (Signed)
Alexandra Scott, Alexandra Scott NO.:  [id number]  MEDICAL RECORD NO.:  [id number]  LOCATION:  1606                         FACILITY:  [facility]  PHYSICIAN:  Ollen Gross, M.D.    DATE OF BIRTH:  27-Nov-1945  DATE OF ADMISSION:  04/06/2011 DATE OF DISCHARGE:  04/10/2011                              DISCHARGE SUMMARY   ADMITTING DIAGNOSES: 1. Osteoarthritis, right hip. 2. Hypertension. 3. History of colonic polyps. 4. History of diverticulosis. 5. Childhood illnesses of measles, mumps, rubella.  DISCHARGE DIAGNOSES: 1. Osteoarthritis, right hip, status post right total hip replacement     arthroplasty. 2. Postoperative acute blood loss anemia. 3. Postoperative hyponatremia. 4. Postoperative hypokalemia. 5. Hypertension. 6. History of colonic polyps. 7. History of diverticulosis. 8. Childhood illnesses of measles, mumps, rubella.  PROCEDURE:  On April 06, 2011, right total hip.  Surgeon - Dr. Lequita Halt. Assistant - Alexzandrew Julien Girt, P.A.C.  Anesthesia- Spinal anesthesia. Blood loss - 450 mL.  CONSULTS:  None.  BRIEF HISTORY:  Ms. Harcum is a 65 year old female with advanced end- stage arthritis of the right hip, progressive worsening pain and dysfunction, failed operative management, now presents for total hip arthroplasty.  LABORATORY DATA:  Preop hemoglobin was 12.3.  Serial CBCs were followed. Hemoglobin dropped down to 8.9 and got as low as 8.3 with a hematocrit of 24.3.  Chem panel on admission has not scanned into the chart. Sodium did drop down to 130 and got as low as 125, back up to 131. Potassium dropped 3.9 to 3.4.  Remaining electrolytes showed chloride dropped also with a safe 95 to 91, but it came back up to 95.  BUN and creatinines remained within normal limits.  Preop UA negative.  X-RAYS:  Postoperative hip and pelvis film shows right total hip arthroplasty with good appearance.  HOSPITAL COURSE:  The patient was admitted to [facility], taken to OR, underwent above-stated procedure without complication.  The patient tolerated the procedure well, later transferred to the recovery room in orthopedic floor, started on p.o. and IV analgesic pain control following surgery, given 24 hours postop IV antibiotics, had good urine output.  Sodium was low though, probably a dilutional component. Started back on her home meds.  Hemovac drain was pulled on day #1 by day #2, did have some nausea and vomiting on the evening day #1, that was a little bit better by day #2.  The patient was looking to go home over the weekend.  We changed the dressing, incision looked good.  Put her on some iron supplementation because her hemoglobin was down to 8.8, but she is asymptomatic with this.  Her sodium reached to a low point at 125, but she was diuresing fluids, so we rechecked it.  Blood pressure was stable.  The following day, her sodium was back up.  Dressing changed, incision looked good, not quite ready from a therapy standpoint.  So, her discharge was held until postop day #4.  At that point, she was doing well, sodium was improved, potassium was stable, met her goals, and probably discharged home on April 10, 2011.  DISCHARGE PLAN: 1. The patient was discharged home on April 10, 2011. 2. Discharge  diagnoses, please see above. 3. Discharge meds:  Nu-Iron, Robaxin, Percocet, promethazine, Xarelto,     aspirin, benazepril, Systane eyedrops,     triamterene/hydrochlorothiazide.  DIET:  Heart-healthy diet.  ACTIVITY:  Partial weightbearing, 25% to 50%.  Hip precautions per total hip protocol.  Follow up in 2 weeks.  DISPOSITION:  Home.  CONDITION UPON DISCHARGE:  Improved.     [name], P.A.C.   ______________________________ Ollen Gross, M.D.    ALP/MEDQ  D:  04/28/2011  T:  04/29/2011  Job:  [id number]  cc:   Georgina Quint. Plotnikov, MD [address] [place] [address]  Ollen Gross,  M.D. Fax: [phone]  Electronically Signed by Patrica Duel P.A.C. on 04/29/2011 09:40:34 AM Electronically Signed by Ollen Gross M.D. on 05/09/2011 11:35:48 AM

## 2011-07-04 ENCOUNTER — Encounter: Payer: Self-pay | Admitting: Internal Medicine

## 2011-07-04 ENCOUNTER — Ambulatory Visit (INDEPENDENT_AMBULATORY_CARE_PROVIDER_SITE_OTHER): Payer: PRIVATE HEALTH INSURANCE | Admitting: Internal Medicine

## 2011-07-04 DIAGNOSIS — I10 Essential (primary) hypertension: Secondary | ICD-10-CM

## 2011-07-04 DIAGNOSIS — F329 Major depressive disorder, single episode, unspecified: Secondary | ICD-10-CM

## 2011-07-04 DIAGNOSIS — M545 Low back pain, unspecified: Secondary | ICD-10-CM

## 2011-07-04 DIAGNOSIS — M25559 Pain in unspecified hip: Secondary | ICD-10-CM

## 2011-07-04 DIAGNOSIS — F3289 Other specified depressive episodes: Secondary | ICD-10-CM

## 2011-07-04 NOTE — Patient Instructions (Signed)
Loose weight please 

## 2011-07-04 NOTE — Assessment & Plan Note (Signed)
Not on Rx 

## 2011-07-04 NOTE — Assessment & Plan Note (Signed)
Continue with current prescription therapy as reflected on the Med list. Ultram. In PT

## 2011-07-04 NOTE — Assessment & Plan Note (Signed)
BP has been OK. She has not had to take Norvasc Continue with current prescription therapy as reflected on the Med list. Loose wt

## 2011-07-04 NOTE — Progress Notes (Signed)
  Subjective:    Patient ID: Alexandra Scott, female    DOB: [birth year], 65 y.o.   MRN: [id number]  HPI  The patient presents for a follow-up of  chronic hypertension, chronic LBP, OA controlled with medicines; s/p R THR 7/12 BP is ok at home  Wt Readings from Last 3 Encounters:  07/04/11 164 lb (74.39 kg)  03/25/11 155 lb (70.308 kg)  11/19/10 158 lb (71.668 kg)      Review of Systems  Constitutional: Positive for unexpected weight change (wt gain). Negative for chills, activity change, appetite change and fatigue.  HENT: Negative for congestion, mouth sores and sinus pressure.   Eyes: Negative for visual disturbance.  Respiratory: Negative for cough and chest tightness.   Gastrointestinal: Negative for nausea and abdominal pain.  Genitourinary: Negative for frequency, difficulty urinating and vaginal pain.  Musculoskeletal: Positive for back pain and gait problem (R hip).  Skin: Negative for pallor and rash.  Neurological: Negative for dizziness, tremors, weakness, numbness and headaches.  Psychiatric/Behavioral: Negative for confusion and sleep disturbance.       Objective:   Physical Exam  Constitutional: She appears well-developed and well-nourished. No distress.  HENT:  Head: Normocephalic.  Right Ear: External ear normal.  Left Ear: External ear normal.  Nose: Nose normal.  Mouth/Throat: Oropharynx is clear and moist.  Eyes: Conjunctivae are normal. Pupils are equal, round, and reactive to light. Right eye exhibits no discharge. Left eye exhibits no discharge.  Neck: Normal range of motion. Neck supple. No JVD present. No tracheal deviation present. No thyromegaly present.  Cardiovascular: Normal rate, regular rhythm and normal heart sounds.   Pulmonary/Chest: No stridor. No respiratory distress. She has no wheezes.  Abdominal: Soft. Bowel sounds are normal. She exhibits no distension and no mass. There is no tenderness. There is no rebound and no guarding.    Musculoskeletal: She exhibits tenderness (R hip w/post-op pain). She exhibits no edema.  Lymphadenopathy:    She has no cervical adenopathy.  Neurological: She displays normal reflexes. No cranial nerve deficit. She exhibits normal muscle tone. Coordination normal.       Cane  Skin: No rash noted. No erythema.  Psychiatric: Her behavior is normal. Judgment and thought content normal.          Assessment & Plan:

## 2011-07-04 NOTE — Assessment & Plan Note (Signed)
Better. In PT. Continue with current prescription therapy as reflected on the Med list.

## 2011-07-14 ENCOUNTER — Encounter: Payer: Self-pay | Admitting: Internal Medicine

## 2011-09-05 ENCOUNTER — Encounter: Payer: Self-pay | Admitting: Internal Medicine

## 2011-11-02 ENCOUNTER — Other Ambulatory Visit (INDEPENDENT_AMBULATORY_CARE_PROVIDER_SITE_OTHER): Payer: PRIVATE HEALTH INSURANCE

## 2011-11-02 ENCOUNTER — Encounter: Payer: Self-pay | Admitting: Internal Medicine

## 2011-11-02 ENCOUNTER — Ambulatory Visit (INDEPENDENT_AMBULATORY_CARE_PROVIDER_SITE_OTHER): Payer: PRIVATE HEALTH INSURANCE | Admitting: Internal Medicine

## 2011-11-02 VITALS — BP 130/80 | HR 80 | Temp 98.7°F | Resp 16 | Wt 161.0 lb

## 2011-11-02 DIAGNOSIS — M169 Osteoarthritis of hip, unspecified: Secondary | ICD-10-CM

## 2011-11-02 DIAGNOSIS — F329 Major depressive disorder, single episode, unspecified: Secondary | ICD-10-CM

## 2011-11-02 DIAGNOSIS — I10 Essential (primary) hypertension: Secondary | ICD-10-CM

## 2011-11-02 LAB — BASIC METABOLIC PANEL
CO2: 27 mEq/L (ref 19–32)
Glucose, Bld: 107 mg/dL — ABNORMAL HIGH (ref 70–99)
Potassium: 3.6 mEq/L (ref 3.5–5.1)
Sodium: 135 mEq/L (ref 135–145)

## 2011-11-02 LAB — CBC WITH DIFFERENTIAL/PLATELET
Eosinophils Relative: 2.2 % (ref 0.0–5.0)
HCT: 34.2 % — ABNORMAL LOW (ref 36.0–46.0)
Lymphs Abs: 1.7 10*3/uL (ref 0.7–4.0)
Monocytes Relative: 8.2 % (ref 3.0–12.0)
Neutrophils Relative %: 67.6 % (ref 43.0–77.0)
Platelets: 239 10*3/uL (ref 150.0–400.0)
RBC: 3.77 Mil/uL — ABNORMAL LOW (ref 3.87–5.11)
WBC: 7.6 10*3/uL (ref 4.5–10.5)

## 2011-11-02 MED ORDER — BENAZEPRIL HCL 40 MG PO TABS: 40.0000 mg | ORAL_TABLET | Freq: Every day | ORAL | Status: DC

## 2011-11-02 MED ORDER — TRIAMTERENE-HCTZ 37.5-25 MG PO TABS: 1.0000 | ORAL_TABLET | Freq: Every day | ORAL | Status: DC

## 2011-11-02 NOTE — Assessment & Plan Note (Signed)
Better  

## 2011-11-02 NOTE — Progress Notes (Signed)
  Subjective:    Patient ID: Alexandra Scott, female    DOB: 1946/02/09, 66 y.o.   MRN: [id number]  HPI The patient presents for a follow-up of  chronic hypertension, chronic dyslipidemia, OA (s/p THR) controlled with medicines     Review of Systems  Constitutional: Negative for chills, activity change, appetite change, fatigue and unexpected weight change.  HENT: Negative for congestion, mouth sores and sinus pressure.   Eyes: Negative for visual disturbance.  Respiratory: Negative for cough and chest tightness.   Gastrointestinal: Negative for nausea and abdominal pain.  Genitourinary: Negative for frequency, difficulty urinating and vaginal pain.  Musculoskeletal: Negative for back pain and gait problem.  Skin: Negative for pallor and rash.  Neurological: Negative for dizziness, tremors, weakness, numbness and headaches.  Psychiatric/Behavioral: Negative for confusion and sleep disturbance.   Wt Readings from Last 3 Encounters:  11/02/11 161 lb (73.029 kg)  07/04/11 164 lb (74.39 kg)  03/25/11 155 lb (70.308 kg)   BP Readings from Last 3 Encounters:  11/02/11 130/80  07/04/11 130/98  03/25/11 140/80        Objective:   Physical Exam  Constitutional: She appears well-developed. No distress.  HENT:  Head: Normocephalic.  Right Ear: External ear normal.  Left Ear: External ear normal.  Nose: Nose normal.  Mouth/Throat: Oropharynx is clear and moist.  Eyes: Conjunctivae are normal. Pupils are equal, round, and reactive to light. Right eye exhibits no discharge. Left eye exhibits no discharge.  Neck: Normal range of motion. Neck supple. No JVD present. No tracheal deviation present. No thyromegaly present.  Cardiovascular: Normal rate, regular rhythm and normal heart sounds.   Pulmonary/Chest: No stridor. No respiratory distress. She has no wheezes.  Abdominal: Soft. Bowel sounds are normal. She exhibits no distension and no mass. There is no tenderness. There is no  rebound and no guarding.  Musculoskeletal: She exhibits tenderness (R troch is tender some). She exhibits no edema.  Lymphadenopathy:    She has no cervical adenopathy.  Neurological: She displays normal reflexes. No cranial nerve deficit. She exhibits normal muscle tone. Coordination normal.  Skin: No rash noted. No erythema.  Psychiatric: She has a normal mood and affect. Her behavior is normal. Judgment and thought content normal.          Assessment & Plan:

## 2011-11-02 NOTE — Assessment & Plan Note (Signed)
Continue with current prescription therapy as reflected on the Med list.  

## 2011-11-03 ENCOUNTER — Encounter: Payer: Self-pay | Admitting: Internal Medicine

## 2011-11-05 ENCOUNTER — Telehealth: Payer: Self-pay | Admitting: *Deleted

## 2011-11-05 DIAGNOSIS — Z0000 Encounter for general adult medical examination without abnormal findings: Secondary | ICD-10-CM

## 2011-11-05 NOTE — Telephone Encounter (Signed)
Message copied by Merrilyn Puma on Sat Nov 05, 2011 12:02 PM ------      Message from: Etheleen Sia      Created: Wed Nov 02, 2011  3:43 PM      Regarding: PHYSICAL LABS IN AUG       STILL HAS PRIVATE INSURANCE / NOT MEDICARE

## 2011-11-05 NOTE — Telephone Encounter (Signed)
Labs entered.

## 2012-05-11 ENCOUNTER — Other Ambulatory Visit (INDEPENDENT_AMBULATORY_CARE_PROVIDER_SITE_OTHER): Payer: PRIVATE HEALTH INSURANCE

## 2012-05-11 DIAGNOSIS — Z0000 Encounter for general adult medical examination without abnormal findings: Secondary | ICD-10-CM

## 2012-05-11 LAB — CBC WITH DIFFERENTIAL/PLATELET
Basophils Relative: 0.5 % (ref 0.0–3.0)
Eosinophils Absolute: 0.2 10*3/uL (ref 0.0–0.7)
Hemoglobin: 11.6 g/dL — ABNORMAL LOW (ref 12.0–15.0)
MCHC: 33.1 g/dL (ref 30.0–36.0)
MCV: 92.2 fl (ref 78.0–100.0)
Monocytes Absolute: 0.6 10*3/uL (ref 0.1–1.0)
Neutro Abs: 3.9 10*3/uL (ref 1.4–7.7)
RBC: 3.82 Mil/uL — ABNORMAL LOW (ref 3.87–5.11)

## 2012-05-11 LAB — HEPATIC FUNCTION PANEL
ALT: 26 U/L (ref 0–35)
Alkaline Phosphatase: 59 U/L (ref 39–117)
Bilirubin, Direct: 0.1 mg/dL (ref 0.0–0.3)
Total Protein: 7.3 g/dL (ref 6.0–8.3)

## 2012-05-11 LAB — BASIC METABOLIC PANEL
CO2: 28 mEq/L (ref 19–32)
Calcium: 9.4 mg/dL (ref 8.4–10.5)
Chloride: 97 mEq/L (ref 96–112)
Sodium: 133 mEq/L — ABNORMAL LOW (ref 135–145)

## 2012-05-11 LAB — URINALYSIS, ROUTINE W REFLEX MICROSCOPIC
Ketones, ur: NEGATIVE
Specific Gravity, Urine: <=1.005 (ref 1.000–1.030)
Total Protein, Urine: NEGATIVE
Urine Glucose: NEGATIVE
Urobilinogen, UA: 0.2 (ref 0.0–1.0)

## 2012-05-11 LAB — LIPID PANEL: Total CHOL/HDL Ratio: 4

## 2012-05-18 ENCOUNTER — Encounter: Payer: Self-pay | Admitting: Internal Medicine

## 2012-05-18 ENCOUNTER — Ambulatory Visit (INDEPENDENT_AMBULATORY_CARE_PROVIDER_SITE_OTHER): Payer: PRIVATE HEALTH INSURANCE | Admitting: Internal Medicine

## 2012-05-18 VITALS — BP 140/80 | HR 80 | Temp 98.7°F | Resp 16 | Ht 60.0 in | Wt 163.0 lb

## 2012-05-18 DIAGNOSIS — D649 Anemia, unspecified: Secondary | ICD-10-CM

## 2012-05-18 DIAGNOSIS — R202 Paresthesia of skin: Secondary | ICD-10-CM

## 2012-05-18 DIAGNOSIS — R209 Unspecified disturbances of skin sensation: Secondary | ICD-10-CM

## 2012-05-18 DIAGNOSIS — D485 Neoplasm of uncertain behavior of skin: Secondary | ICD-10-CM

## 2012-05-18 DIAGNOSIS — Z0000 Encounter for general adult medical examination without abnormal findings: Secondary | ICD-10-CM | POA: Insufficient documentation

## 2012-05-18 MED ORDER — FERROUS SULFATE 325 (65 FE) MG PO TABS: 325.0000 mg | ORAL_TABLET | Freq: Every day | ORAL | Status: DC

## 2012-05-18 NOTE — Assessment & Plan Note (Signed)
8/13 L neck x 2 Skin bx

## 2012-05-18 NOTE — Assessment & Plan Note (Addendum)
We discussed age appropriate health related issues, including available/recomended screening tests and vaccinations. We discussed a need for adhering to healthy diet and exercise. Labs/EKG were reviewed/ordered. All questions were answered.  Loose wt Zostavax suggested Colon 2011

## 2012-05-18 NOTE — Progress Notes (Signed)
Patient ID: Alexandra Scott, female   DOB: 07-Dec-1945, 66 y.o.   MRN: [id number]  Subjective:    Patient ID: Alexandra Scott, female    DOB: 11-Apr-1946, 66 y.o.   MRN: [id number]  HPI The patient is here for a wellness exam. The patient has been doing well overall without major physical or psychological issues going on lately. The patient presents for a follow-up of  chronic hypertension, chronic dyslipidemia,OA controlled with medicine  S/p  R THR on 04-06-11  BP Readings from Last 3 Encounters:  05/18/12 140/80  11/02/11 130/80  07/04/11 130/98   Wt Readings from Last 3 Encounters:  05/18/12 163 lb (73.936 kg)  11/02/11 161 lb (73.029 kg)  07/04/11 164 lb (74.39 kg)     Past Medical History  Diagnosis Date  . HTN (hypertension)   . Colon polyps   . Breast cyst 2008    Left / SER  . Constipation   . LBP (low back pain)     ? Spinal stenosis  . Osteoarthritis   . Osteoarthritis of hip 2011    Right/Dr Fields   Past Surgical History  Procedure Date  . Joint replacement 7/12    R THR    reports that she has never smoked. She does not have any smokeless tobacco history on file. She reports that she does not drink alcohol or use illicit drugs. family history includes Breast cancer in her other and Hypertension in her other. No Known Allergies Current Outpatient Prescriptions on File Prior to Visit  Medication Sig Dispense Refill  . acetaminophen (TYLENOL) 500 MG tablet Take 1,000 mg by mouth daily as needed.        Marland Kitchen aspirin 81 MG EC tablet Take 81 mg by mouth daily.        . benazepril (LOTENSIN) 40 MG tablet Take 1 tablet (40 mg total) by mouth daily.  30 tablet  11  . Cholecalciferol 1000 UNITS tablet Take 1,000 Units by mouth every other day. Tues, Thurs & Sat      . fish oil-omega-3 fatty acids 1000 MG capsule Take 1 g by mouth daily.        . Glucosamine 500 MG CAPS Take 2,000 mg by mouth daily.        Marland Kitchen ibuprofen (ADVIL,MOTRIN) 200 MG tablet Take 200 mg by  mouth 4 (four) times daily as needed.        . NON FORMULARY 9 Gin soaked raisins daily       . polyethylene glycol powder (GLYCOLAX/MIRALAX) powder Take 17 g by mouth daily.        . psyllium (METAMUCIL) 58.6 % packet Take 1 packet by mouth daily.        . traMADol (ULTRAM) 50 MG tablet Take 50-100 mg by mouth 2 (two) times daily as needed.        . triamterene-hydrochlorothiazide (MAXZIDE-25) 37.5-25 MG per tablet Take 1 each (1 tablet total) by mouth daily.  30 tablet  11   BP 140/80  Pulse 80  Temp 98.7 F (37.1 C) (Oral)  Resp 16  Ht 5' (1.524 m)  Wt 163 lb (73.936 kg)  BMI 31.83 kg/m2  Review of Systems  Constitutional: Negative for fever, chills, diaphoresis, activity change, appetite change, fatigue and unexpected weight change.  HENT: Negative for hearing loss, ear pain, congestion, sore throat, sneezing, mouth sores, neck pain, dental problem, voice change, postnasal drip and sinus pressure.   Eyes: Negative for pain and visual disturbance.  Respiratory: Negative for cough, chest tightness, wheezing and stridor.   Cardiovascular: Negative for chest pain, palpitations and leg swelling.  Gastrointestinal: Negative for nausea, vomiting, abdominal pain, blood in stool, abdominal distention and rectal pain.  Genitourinary: Negative for dysuria, hematuria, decreased urine volume, vaginal bleeding, vaginal discharge, difficulty urinating, vaginal pain and menstrual problem.  Musculoskeletal: Negative for back pain, joint swelling, arthralgias and gait problem.  Skin: Negative for color change, rash and wound.  Neurological: Negative for dizziness, tremors, syncope, speech difficulty and light-headedness.  Hematological: Negative for adenopathy.  Psychiatric/Behavioral: Negative for suicidal ideas, hallucinations, behavioral problems, confusion, disturbed wake/sleep cycle, dysphoric mood and decreased concentration. The patient is not nervous/anxious and is not hyperactive.   2 moles  on L neck     Objective:   Physical Exam  Constitutional: She appears well-developed. No distress.       Obese   HENT:  Head: Normocephalic.  Right Ear: External ear normal.  Left Ear: External ear normal.  Nose: Nose normal.  Mouth/Throat: Oropharynx is clear and moist.  Eyes: Conjunctivae are normal. Pupils are equal, round, and reactive to light. Right eye exhibits no discharge. Left eye exhibits no discharge.  Neck: Normal range of motion. Neck supple. No JVD present. No tracheal deviation present. No thyromegaly present.  Cardiovascular: Normal rate, regular rhythm and normal heart sounds.   Pulmonary/Chest: No stridor. No respiratory distress. She has no wheezes.  Abdominal: Soft. Bowel sounds are normal. She exhibits no distension and no mass. There is no tenderness. There is no rebound and no guarding.  Musculoskeletal: She exhibits no edema and no tenderness (R hip is NT).  Lymphadenopathy:    She has no cervical adenopathy.  Neurological: She displays normal reflexes. No cranial nerve deficit. She exhibits normal muscle tone. Coordination normal.  Skin: No rash noted. No erythema.  Psychiatric: She has a normal mood and affect. Her behavior is normal. Judgment and thought content normal.  2 moles on L neck  Lab Results  Component Value Date   WBC 7.4 05/11/2012   HGB 11.6* 05/11/2012   HCT 35.2* 05/11/2012   PLT 244.0 05/11/2012   CHOL 161 05/11/2012   TRIG 118.0 05/11/2012   HDL 43.90 05/11/2012   LDLDIRECT 150.6 10/15/2007   ALT 26 05/11/2012   AST 24 05/11/2012   NA 133* 05/11/2012   K 4.3 05/11/2012   CL 97 05/11/2012   CREATININE 0.7 05/11/2012   BUN 20 05/11/2012   CO2 28 05/11/2012   TSH 3.74 05/11/2012   INR 0.97 03/29/2011           Assessment & Plan:

## 2012-05-20 ENCOUNTER — Encounter: Payer: Self-pay | Admitting: Internal Medicine

## 2012-06-29 ENCOUNTER — Encounter: Payer: Self-pay | Admitting: Internal Medicine

## 2012-06-29 ENCOUNTER — Ambulatory Visit (INDEPENDENT_AMBULATORY_CARE_PROVIDER_SITE_OTHER): Payer: PRIVATE HEALTH INSURANCE | Admitting: Internal Medicine

## 2012-06-29 VITALS — BP 120/88 | HR 80 | Temp 98.7°F | Resp 16 | Wt 162.0 lb

## 2012-06-29 DIAGNOSIS — L570 Actinic keratosis: Secondary | ICD-10-CM

## 2012-06-29 NOTE — Patient Instructions (Addendum)
   Postprocedure instructions :     Keep the wounds clean. You can wash them with liquid soap and water. Pat dry with gauze or a Kleenex tissue  Before applying antibiotic ointment and a Band-Aid.   You need to report immediately  if  any signs of infection develop.    

## 2012-07-01 ENCOUNTER — Encounter: Payer: Self-pay | Admitting: Internal Medicine

## 2012-07-01 DIAGNOSIS — L570 Actinic keratosis: Secondary | ICD-10-CM | POA: Insufficient documentation

## 2012-07-01 NOTE — Assessment & Plan Note (Signed)
See procedure 

## 2012-07-01 NOTE — Progress Notes (Signed)
Patient ID: KLA BILY, female   DOB: 07-03-1946, 66 y.o.   MRN: [id number]   Procedure Note :     Procedure : Cryosurgery   Indication:  Actinic keratosis(es)   Risks including unsuccessful procedure , bleeding, infection, bruising, scar, a need for a repeat  procedure and others were explained to the patient in detail as well as the benefits. Informed consent was obtained verbally.    4 lesion(s)  on  Neck and chest  was/were treated with liquid nitrogen on a Q-tip in a usual fasion . Band-Aid was applied and antibiotic ointment was given for a later use.   Tolerated well. Complications none.   Postprocedure instructions :     Keep the wounds clean. You can wash them with liquid soap and water. Pat dry with gauze or a Kleenex tissue  Before applying antibiotic ointment and a Band-Aid.   You need to report immediately  if  any signs of infection develop.

## 2012-11-13 ENCOUNTER — Other Ambulatory Visit (INDEPENDENT_AMBULATORY_CARE_PROVIDER_SITE_OTHER): Payer: PRIVATE HEALTH INSURANCE

## 2012-11-13 DIAGNOSIS — Z0000 Encounter for general adult medical examination without abnormal findings: Secondary | ICD-10-CM

## 2012-11-13 DIAGNOSIS — D649 Anemia, unspecified: Secondary | ICD-10-CM

## 2012-11-13 DIAGNOSIS — R209 Unspecified disturbances of skin sensation: Secondary | ICD-10-CM

## 2012-11-13 DIAGNOSIS — R202 Paresthesia of skin: Secondary | ICD-10-CM

## 2012-11-13 LAB — CBC WITH DIFFERENTIAL/PLATELET
Basophils Relative: 0.4 % (ref 0.0–3.0)
Eosinophils Relative: 3.7 % (ref 0.0–5.0)
Lymphocytes Relative: 24.7 % (ref 12.0–46.0)
MCV: 90.8 fl (ref 78.0–100.0)
Monocytes Relative: 9.2 % (ref 3.0–12.0)
Neutrophils Relative %: 62 % (ref 43.0–77.0)
RBC: 4.03 Mil/uL (ref 3.87–5.11)
WBC: 8.9 10*3/uL (ref 4.5–10.5)

## 2012-11-13 LAB — IBC PANEL
Saturation Ratios: 25.5 % (ref 20.0–50.0)
Transferrin: 227.1 mg/dL (ref 212.0–360.0)

## 2012-11-13 LAB — BASIC METABOLIC PANEL
CO2: 28 mEq/L (ref 19–32)
Chloride: 104 mEq/L (ref 96–112)
Glucose, Bld: 101 mg/dL — ABNORMAL HIGH (ref 70–99)
Sodium: 140 mEq/L (ref 135–145)

## 2012-11-13 LAB — VITAMIN B12: Vitamin B-12: 568 pg/mL (ref 211–911)

## 2012-11-19 ENCOUNTER — Ambulatory Visit: Payer: PRIVATE HEALTH INSURANCE | Admitting: Internal Medicine

## 2012-11-20 ENCOUNTER — Encounter: Payer: Self-pay | Admitting: Internal Medicine

## 2012-11-20 ENCOUNTER — Ambulatory Visit (INDEPENDENT_AMBULATORY_CARE_PROVIDER_SITE_OTHER): Payer: PRIVATE HEALTH INSURANCE | Admitting: Internal Medicine

## 2012-11-20 VITALS — BP 110/68 | HR 90 | Temp 97.8°F | Resp 16 | Wt 165.8 lb

## 2012-11-20 DIAGNOSIS — D649 Anemia, unspecified: Secondary | ICD-10-CM

## 2012-11-20 DIAGNOSIS — F329 Major depressive disorder, single episode, unspecified: Secondary | ICD-10-CM

## 2012-11-20 DIAGNOSIS — I10 Essential (primary) hypertension: Secondary | ICD-10-CM

## 2012-11-20 MED ORDER — TRIAMTERENE-HCTZ 37.5-25 MG PO TABS: 1.0000 | ORAL_TABLET | Freq: Every day | ORAL | Status: DC

## 2012-11-20 MED ORDER — BENAZEPRIL HCL 40 MG PO TABS: 40.0000 mg | ORAL_TABLET | Freq: Every day | ORAL | Status: DC

## 2012-11-20 NOTE — Assessment & Plan Note (Signed)
Continue with current prescription therapy as reflected on the Med list.  

## 2012-11-20 NOTE — Progress Notes (Signed)
Subjective:    HPI  The patient presents for a follow-up of  chronic hypertension, chronic dyslipidemia,OA controlled with medicine  S/p  R THR on 04-06-11 - doing good  BP Readings from Last 3 Encounters:  11/20/12 110/68  06/29/12 120/88  05/18/12 140/80   Wt Readings from Last 3 Encounters:  11/20/12 165 lb 12 oz (75.184 kg)  06/29/12 162 lb (73.483 kg)  05/18/12 163 lb (73.936 kg)     Past Medical History  Diagnosis Date  . HTN (hypertension)   . Colon polyps   . Breast cyst 2008    Left / SER  . Constipation   . LBP (low back pain)     ? Spinal stenosis  . Osteoarthritis   . Osteoarthritis of hip 2011    Right/Dr Fields   Past Surgical History  Procedure Laterality Date  . Joint replacement  7/12    R THR    reports that she has never smoked. She does not have any smokeless tobacco history on file. She reports that she does not drink alcohol or use illicit drugs. family history includes Breast cancer in her other and Hypertension in her other. No Known Allergies Current Outpatient Prescriptions on File Prior to Visit  Medication Sig Dispense Refill  . aspirin 81 MG EC tablet Take 81 mg by mouth daily.        . benazepril (LOTENSIN) 40 MG tablet Take 1 tablet (40 mg total) by mouth daily.  30 tablet  11  . Cholecalciferol 1000 UNITS tablet Take 1,000 Units by mouth every other day. Tues, Thurs & Sat      . fish oil-omega-3 fatty acids 1000 MG capsule Take 1 g by mouth daily.        . psyllium (METAMUCIL) 58.6 % packet Take 1 packet by mouth daily.        Marland Kitchen triamterene-hydrochlorothiazide (MAXZIDE-25) 37.5-25 MG per tablet Take 1 each (1 tablet total) by mouth daily.  30 tablet  11  . ferrous sulfate 325 (65 FE) MG tablet Take 1 tablet (325 mg total) by mouth daily.  30 tablet  6   No current facility-administered medications on file prior to visit.   BP 110/68  Pulse 90  Temp(Src) 97.8 F (36.6 C) (Oral)  Resp 16  Wt 165 lb 12 oz (75.184 kg)  BMI  32.37 kg/m2  SpO2 97%  Review of Systems  Constitutional: Negative for fever, chills, diaphoresis, activity change, appetite change, fatigue and unexpected weight change.  HENT: Negative for hearing loss, ear pain, congestion, sore throat, sneezing, mouth sores, neck pain, dental problem, voice change, postnasal drip and sinus pressure.   Eyes: Negative for pain and visual disturbance.  Respiratory: Negative for cough, chest tightness, wheezing and stridor.   Cardiovascular: Negative for chest pain, palpitations and leg swelling.  Gastrointestinal: Negative for nausea, vomiting, abdominal pain, blood in stool, abdominal distention and rectal pain.  Genitourinary: Negative for dysuria, hematuria, decreased urine volume, vaginal bleeding, vaginal discharge, difficulty urinating, vaginal pain and menstrual problem.  Musculoskeletal: Negative for back pain, joint swelling, arthralgias and gait problem.  Skin: Negative for color change, rash and wound.  Neurological: Negative for dizziness, tremors, syncope, speech difficulty and light-headedness.  Hematological: Negative for adenopathy.  Psychiatric/Behavioral: Negative for suicidal ideas, hallucinations, behavioral problems, confusion, sleep disturbance, dysphoric mood and decreased concentration. The patient is not nervous/anxious and is not hyperactive.       Objective:   Physical Exam  Constitutional: She appears well-developed.  No distress.  Obese   HENT:  Head: Normocephalic.  Right Ear: External ear normal.  Left Ear: External ear normal.  Nose: Nose normal.  Mouth/Throat: Oropharynx is clear and moist.  Eyes: Conjunctivae are normal. Pupils are equal, round, and reactive to light. Right eye exhibits no discharge. Left eye exhibits no discharge.  Neck: Normal range of motion. Neck supple. No JVD present. No tracheal deviation present. No thyromegaly present.  Cardiovascular: Normal rate, regular rhythm and normal heart sounds.    Pulmonary/Chest: No stridor. No respiratory distress. She has no wheezes.  Abdominal: Soft. Bowel sounds are normal. She exhibits no distension and no mass. There is no tenderness. There is no rebound and no guarding.  Musculoskeletal: She exhibits no edema and no tenderness (R hip is NT).  Lymphadenopathy:    She has no cervical adenopathy.  Neurological: She displays normal reflexes. No cranial nerve deficit. She exhibits normal muscle tone. Coordination normal.  Skin: No rash noted. No erythema.  Psychiatric: She has a normal mood and affect. Her behavior is normal. Judgment and thought content normal.   Lab Results  Component Value Date   WBC 8.9 11/13/2012   HGB 12.3 11/13/2012   HCT 36.6 11/13/2012   PLT 252.0 11/13/2012   CHOL 161 05/11/2012   TRIG 118.0 05/11/2012   HDL 43.90 05/11/2012   LDLDIRECT 150.6 10/15/2007   ALT 26 05/11/2012   AST 24 05/11/2012   NA 140 11/13/2012   K 4.3 11/13/2012   CL 104 11/13/2012   CREATININE 1.0 11/13/2012   BUN 27* 11/13/2012   CO2 28 11/13/2012   TSH 3.74 05/11/2012   INR 0.97 03/29/2011           Assessment & Plan:

## 2012-11-20 NOTE — Assessment & Plan Note (Signed)
Recovered - labs reviewed

## 2012-11-20 NOTE — Assessment & Plan Note (Signed)
Resolved

## 2012-11-20 NOTE — Assessment & Plan Note (Signed)
Continue with current exercises. Not on Rx

## 2012-12-31 ENCOUNTER — Telehealth: Payer: Self-pay | Admitting: *Deleted

## 2012-12-31 DIAGNOSIS — Z0000 Encounter for general adult medical examination without abnormal findings: Secondary | ICD-10-CM

## 2012-12-31 NOTE — Telephone Encounter (Signed)
Message copied by Merrilyn Puma on Mon Dec 31, 2012  4:44 PM ------      Message from: Etheleen Sia      Created: Tue Nov 20, 2012  3:39 PM      Regarding: LAB       PHYSICAL LAB IN AUG  ------

## 2012-12-31 NOTE — Telephone Encounter (Signed)
Labs entered.

## 2013-01-31 ENCOUNTER — Encounter: Payer: Self-pay | Admitting: Internal Medicine

## 2013-01-31 ENCOUNTER — Ambulatory Visit (INDEPENDENT_AMBULATORY_CARE_PROVIDER_SITE_OTHER): Payer: PRIVATE HEALTH INSURANCE | Admitting: Internal Medicine

## 2013-01-31 VITALS — BP 122/80 | HR 107 | Temp 98.3°F | Ht 62.0 in | Wt 165.5 lb

## 2013-01-31 DIAGNOSIS — L0231 Cutaneous abscess of buttock: Secondary | ICD-10-CM | POA: Insufficient documentation

## 2013-01-31 DIAGNOSIS — M169 Osteoarthritis of hip, unspecified: Secondary | ICD-10-CM

## 2013-01-31 DIAGNOSIS — L03317 Cellulitis of buttock: Secondary | ICD-10-CM | POA: Insufficient documentation

## 2013-01-31 DIAGNOSIS — I10 Essential (primary) hypertension: Secondary | ICD-10-CM

## 2013-01-31 MED ORDER — DOXYCYCLINE HYCLATE 100 MG PO TABS: 100.0000 mg | ORAL_TABLET | Freq: Two times a day (BID) | ORAL | Status: DC

## 2013-01-31 NOTE — Assessment & Plan Note (Signed)
Nontender, doubt septic joint, pt reassured,  to f/u any worsening symptoms or concerns

## 2013-01-31 NOTE — Assessment & Plan Note (Signed)
Mild to mod, for antibx course,  to f/u any worsening symptoms or concerns 

## 2013-01-31 NOTE — Patient Instructions (Signed)
Please take all new medication as prescribed Please continue all other medications as before 

## 2013-01-31 NOTE — Progress Notes (Signed)
Subjective:    Patient ID: Alexandra Scott, female    DOB: [birth year], 68 y.o.   MRN: [id number]  HPI  Here with c/o 10 days right buttock area red, tender ,swelling mild to mod without fever, drainage or prior hx of same.  No prior hx of MRSA or similar boils. Very concerned with her hx of July 2012 hip surgury that is could become infected.    Pt denies chest pain, increased sob or doe, wheezing, orthopnea, PND, increased LE swelling, palpitations, dizziness or syncope. No falls.   Pt denies polydipsia, polyuria, Pt denies new neurological symptoms such as new headache, or facial or extremity weakness or numbness     Past Medical History  Diagnosis Date  . HTN (hypertension)   . Colon polyps   . Breast cyst 2008    Left / SER  . Constipation   . LBP (low back pain)     ? Spinal stenosis  . Osteoarthritis   . Osteoarthritis of hip 2011    Right/Dr Fields   Past Surgical History  Procedure Laterality Date  . Joint replacement  7/12    R THR    reports that she has never smoked. She does not have any smokeless tobacco history on file. She reports that she does not drink alcohol or use illicit drugs. family history includes Breast cancer in her other and Hypertension in her other. No Known Allergies Current Outpatient Prescriptions on File Prior to Visit  Medication Sig Dispense Refill  . aspirin 81 MG EC tablet Take 81 mg by mouth daily.        . benazepril (LOTENSIN) 40 MG tablet Take 1 tablet (40 mg total) by mouth daily.  30 tablet  11  . CALCIUM-VITAMIN D PO Take by mouth. Calcium 1200 mg + Vitamin D 800      . Cholecalciferol 1000 UNITS tablet Take 1,000 Units by mouth every other day. Tues, Thurs & Sat      . fish oil-omega-3 fatty acids 1000 MG capsule Take 1 g by mouth daily.        . Magnesium 250 MG TABS Take by mouth daily.      . psyllium (METAMUCIL) 58.6 % packet Take 1 packet by mouth daily.        Marland Kitchen triamterene-hydrochlorothiazide (MAXZIDE-25) 37.5-25 MG per  tablet Take 1 each (1 tablet total) by mouth daily.  30 tablet  11   No current facility-administered medications on file prior to visit.   Review of Systems  Constitutional: Negative for unexpected weight change, or unusual diaphoresis  HENT: Negative for tinnitus.   Eyes: Negative for photophobia and visual disturbance.  Respiratory: Negative for choking and stridor.   Gastrointestinal: Negative for vomiting and blood in stool.  Genitourinary: Negative for hematuria and decreased urine volume.  Musculoskeletal: Negative for acute joint swelling Skin: Negative for color change and wound.  Neurological: Negative for tremors and numbness other than noted  Psychiatric/Behavioral: Negative for decreased concentration or  hyperactivity.       Objective:   Physical Exam BP 122/80  Pulse 107  Temp(Src) 98.3 F (36.8 C) (Oral)  Ht 5\' 2"  (1.575 m)  Wt 165 lb 8 oz (75.07 kg)  BMI 30.26 kg/m2  SpO2 99% VS noted,  Constitutional: Pt appears well-developed and well-nourished.  HENT: Head: NCAT.  Right Ear: External ear normal.  Left Ear: External ear normal.  Eyes: Conjunctivae and EOM are normal. Pupils are equal, round, and reactive to light.  Neck: Normal range of motion. Neck supple.  Cardiovascular: Normal rate and regular rhythm.   Pulmonary/Chest: Effort normal and breath sounds normal.  Abd:  Soft, NT, non-distended, + BS Neurological: Pt is alert. Not confused  Skin: right buttock at ischail tuberosity area with 2 cm area mild erythema, tender with ? Slight weepiness/induration, but no fluctuance or drainage  Right hip nontender, FROM Psychiatric: Pt behavior is normal. Thought content normal.     Assessment & Plan:

## 2013-01-31 NOTE — Assessment & Plan Note (Signed)
stable overall by history and exam, recent data reviewed with pt, and pt to continue medical treatment as before,  to f/u any worsening symptoms or concerns BP Readings from Last 3 Encounters:  01/31/13 122/80  11/20/12 110/68  06/29/12 120/88

## 2013-04-18 ENCOUNTER — Other Ambulatory Visit: Payer: Self-pay | Admitting: Gynecology

## 2013-05-14 ENCOUNTER — Other Ambulatory Visit (INDEPENDENT_AMBULATORY_CARE_PROVIDER_SITE_OTHER): Payer: PRIVATE HEALTH INSURANCE

## 2013-05-14 DIAGNOSIS — Z0000 Encounter for general adult medical examination without abnormal findings: Secondary | ICD-10-CM

## 2013-05-14 LAB — CBC WITH DIFFERENTIAL/PLATELET
Basophils Absolute: 0 10*3/uL (ref 0.0–0.1)
Eosinophils Absolute: 0.3 10*3/uL (ref 0.0–0.7)
Lymphocytes Relative: 28.9 % (ref 12.0–46.0)
MCHC: 34.8 g/dL (ref 30.0–36.0)
MCV: 89.9 fl (ref 78.0–100.0)
Monocytes Absolute: 0.8 10*3/uL (ref 0.1–1.0)
Neutrophils Relative %: 56.1 % (ref 43.0–77.0)
Platelets: 259 10*3/uL (ref 150.0–400.0)
RDW: 13 % (ref 11.5–14.6)

## 2013-05-14 LAB — BASIC METABOLIC PANEL
CO2: 26 mEq/L (ref 19–32)
Calcium: 9.5 mg/dL (ref 8.4–10.5)
Chloride: 104 mEq/L (ref 96–112)
Creatinine, Ser: 0.9 mg/dL (ref 0.4–1.2)
Glucose, Bld: 88 mg/dL (ref 70–99)

## 2013-05-14 LAB — HEPATIC FUNCTION PANEL
ALT: 34 U/L (ref 0–35)
Albumin: 3.9 g/dL (ref 3.5–5.2)
Alkaline Phosphatase: 58 U/L (ref 39–117)
Bilirubin, Direct: 0.1 mg/dL (ref 0.0–0.3)
Total Protein: 7.2 g/dL (ref 6.0–8.3)

## 2013-05-14 LAB — LIPID PANEL
HDL: 35.6 mg/dL — ABNORMAL LOW (ref >39.00)
Total CHOL/HDL Ratio: 4
Triglycerides: 100 mg/dL (ref 0.0–149.0)

## 2013-05-14 LAB — URINALYSIS, ROUTINE W REFLEX MICROSCOPIC
Hgb urine dipstick: NEGATIVE
Ketones, ur: NEGATIVE
Specific Gravity, Urine: 1.015 (ref 1.000–1.030)
Urine Glucose: NEGATIVE
Urobilinogen, UA: 0.2 (ref 0.0–1.0)

## 2013-05-21 ENCOUNTER — Encounter: Payer: Self-pay | Admitting: Internal Medicine

## 2013-05-21 ENCOUNTER — Ambulatory Visit (INDEPENDENT_AMBULATORY_CARE_PROVIDER_SITE_OTHER): Payer: PRIVATE HEALTH INSURANCE | Admitting: Internal Medicine

## 2013-05-21 VITALS — BP 138/80 | HR 94 | Temp 98.8°F | Ht 60.0 in | Wt 165.0 lb

## 2013-05-21 DIAGNOSIS — I10 Essential (primary) hypertension: Secondary | ICD-10-CM

## 2013-05-21 DIAGNOSIS — M1990 Unspecified osteoarthritis, unspecified site: Secondary | ICD-10-CM

## 2013-05-21 DIAGNOSIS — Z0000 Encounter for general adult medical examination without abnormal findings: Secondary | ICD-10-CM

## 2013-05-21 NOTE — Assessment & Plan Note (Signed)
Doing well 

## 2013-05-21 NOTE — Assessment & Plan Note (Addendum)
  We discussed age appropriate health related issues, including available/recomended screening tests and vaccinations. We discussed a need for adhering to healthy diet and exercise. Labs/EKG were reviewed/ordered. All questions were answered.  Zostavax suggested Colon 2011 GYN, mammo q 12 mo; Opth check q 2 years Declined a flu shot

## 2013-05-21 NOTE — Progress Notes (Signed)
Subjective:    HPI The patient is here for a wellness exam. The patient has been doing well overall without major physical or psychological issues going on lately.  The patient presents for a follow-up of  chronic hypertension, chronic dyslipidemia,OA controlled with medicine  S/p  R THR on 04-06-11  BP Readings from Last 3 Encounters:  05/21/13 138/80  01/31/13 122/80  11/20/12 110/68   Wt Readings from Last 3 Encounters:  05/21/13 165 lb (74.844 kg)  01/31/13 165 lb 8 oz (75.07 kg)  11/20/12 165 lb 12 oz (75.184 kg)     Past Medical History  Diagnosis Date  . HTN (hypertension)   . Colon polyps   . Breast cyst 2008    Left / SER  . Constipation   . LBP (low back pain)     ? Spinal stenosis  . Osteoarthritis   . Osteoarthritis of hip 2011    Right/Dr Fields   Past Surgical History  Procedure Laterality Date  . Joint replacement  7/12    R THR    reports that she has never smoked. She does not have any smokeless tobacco history on file. She reports that she does not drink alcohol or use illicit drugs. family history includes Breast cancer in her other; Hypertension in her other. No Known Allergies Current Outpatient Prescriptions on File Prior to Visit  Medication Sig Dispense Refill  . aspirin 81 MG EC tablet Take 81 mg by mouth daily.        . benazepril (LOTENSIN) 40 MG tablet Take 1 tablet (40 mg total) by mouth daily.  30 tablet  11  . CALCIUM-VITAMIN D PO Take by mouth. Calcium 1200 mg + Vitamin D 800      . fish oil-omega-3 fatty acids 1000 MG capsule Take 1 g by mouth daily.        . Magnesium 250 MG TABS Take by mouth daily.      . psyllium (METAMUCIL) 58.6 % packet Take 1 packet by mouth daily.        Marland Kitchen triamterene-hydrochlorothiazide (MAXZIDE-25) 37.5-25 MG per tablet Take 1 each (1 tablet total) by mouth daily.  30 tablet  11  . Cholecalciferol 1000 UNITS tablet Take 1,000 Units by mouth every other day. Tues, Thurs & Sat       No current  facility-administered medications on file prior to visit.   BP 138/80  Pulse 94  Temp(Src) 98.8 F (37.1 C) (Oral)  Ht 5' (1.524 m)  Wt 165 lb (74.844 kg)  BMI 32.22 kg/m2  SpO2 96%  Review of Systems  Constitutional: Negative for fever, chills, diaphoresis, activity change, appetite change, fatigue and unexpected weight change.  HENT: Negative for hearing loss, ear pain, congestion, sore throat, sneezing, mouth sores, neck pain, dental problem, voice change, postnasal drip and sinus pressure.   Eyes: Negative for pain and visual disturbance.  Respiratory: Negative for cough, chest tightness, wheezing and stridor.   Cardiovascular: Negative for chest pain, palpitations and leg swelling.  Gastrointestinal: Negative for nausea, vomiting, abdominal pain, blood in stool, abdominal distention and rectal pain.  Genitourinary: Negative for dysuria, hematuria, decreased urine volume, vaginal bleeding, vaginal discharge, difficulty urinating, vaginal pain and menstrual problem.  Musculoskeletal: Negative for back pain, joint swelling, arthralgias and gait problem.  Skin: Negative for color change, rash and wound.  Neurological: Negative for dizziness, tremors, syncope, speech difficulty and light-headedness.  Hematological: Negative for adenopathy.  Psychiatric/Behavioral: Negative for suicidal ideas, hallucinations, behavioral problems, confusion,  sleep disturbance, dysphoric mood and decreased concentration. The patient is not nervous/anxious and is not hyperactive.   2 moles on L neck     Objective:   Physical Exam  Constitutional: She appears well-developed. No distress.  Obese   HENT:  Head: Normocephalic.  Right Ear: External ear normal.  Left Ear: External ear normal.  Nose: Nose normal.  Mouth/Throat: Oropharynx is clear and moist.  Eyes: Conjunctivae are normal. Pupils are equal, round, and reactive to light. Right eye exhibits no discharge. Left eye exhibits no discharge.   Neck: Normal range of motion. Neck supple. No JVD present. No tracheal deviation present. No thyromegaly present.  Cardiovascular: Normal rate, regular rhythm and normal heart sounds.   Pulmonary/Chest: No stridor. No respiratory distress. She has no wheezes.  Abdominal: Soft. Bowel sounds are normal. She exhibits no distension and no mass. There is no tenderness. There is no rebound and no guarding.  Musculoskeletal: She exhibits no edema and no tenderness (R hip is NT).  Lymphadenopathy:    She has no cervical adenopathy.  Neurological: She displays normal reflexes. No cranial nerve deficit. She exhibits normal muscle tone. Coordination normal.  Skin: No rash noted. No erythema.  Psychiatric: She has a normal mood and affect. Her behavior is normal. Judgment and thought content normal.  2 moles on L neck  Lab Results  Component Value Date   WBC 7.6 05/14/2013   HGB 12.3 05/14/2013   HCT 35.4* 05/14/2013   PLT 259.0 05/14/2013   CHOL 160 05/14/2013   TRIG 100.0 05/14/2013   HDL 35.60* 05/14/2013   LDLDIRECT 150.6 10/15/2007   ALT 34 05/14/2013   AST 28 05/14/2013   NA 136 05/14/2013   K 4.4 05/14/2013   CL 104 05/14/2013   CREATININE 0.9 05/14/2013   BUN 21 05/14/2013   CO2 26 05/14/2013   TSH 3.68 05/14/2013   INR 0.97 03/29/2011           Assessment & Plan:

## 2013-05-21 NOTE — Assessment & Plan Note (Signed)
Continue with current prescription therapy as reflected on the Med list.  

## 2013-11-11 ENCOUNTER — Other Ambulatory Visit: Payer: Self-pay | Admitting: Internal Medicine

## 2013-11-22 ENCOUNTER — Other Ambulatory Visit (INDEPENDENT_AMBULATORY_CARE_PROVIDER_SITE_OTHER): Payer: PRIVATE HEALTH INSURANCE

## 2013-11-22 ENCOUNTER — Ambulatory Visit (INDEPENDENT_AMBULATORY_CARE_PROVIDER_SITE_OTHER): Payer: PRIVATE HEALTH INSURANCE | Admitting: Internal Medicine

## 2013-11-22 ENCOUNTER — Encounter: Payer: Self-pay | Admitting: Internal Medicine

## 2013-11-22 VITALS — BP 138/70 | HR 76 | Temp 99.1°F | Resp 16 | Wt 164.0 lb

## 2013-11-22 DIAGNOSIS — R5383 Other fatigue: Secondary | ICD-10-CM

## 2013-11-22 DIAGNOSIS — M545 Low back pain, unspecified: Secondary | ICD-10-CM

## 2013-11-22 DIAGNOSIS — R5381 Other malaise: Secondary | ICD-10-CM

## 2013-11-22 DIAGNOSIS — R531 Weakness: Secondary | ICD-10-CM | POA: Insufficient documentation

## 2013-11-22 DIAGNOSIS — M25519 Pain in unspecified shoulder: Secondary | ICD-10-CM

## 2013-11-22 DIAGNOSIS — I10 Essential (primary) hypertension: Secondary | ICD-10-CM

## 2013-11-22 DIAGNOSIS — M25511 Pain in right shoulder: Secondary | ICD-10-CM | POA: Insufficient documentation

## 2013-11-22 LAB — CBC WITH DIFFERENTIAL/PLATELET
Basophils Absolute: 0 10*3/uL (ref 0.0–0.1)
Basophils Relative: 0.6 % (ref 0.0–3.0)
EOS ABS: 0.3 10*3/uL (ref 0.0–0.7)
EOS PCT: 3.7 % (ref 0.0–5.0)
HEMATOCRIT: 38.8 % (ref 36.0–46.0)
Hemoglobin: 12.8 g/dL (ref 12.0–15.0)
LYMPHS ABS: 2.2 10*3/uL (ref 0.7–4.0)
Lymphocytes Relative: 26.7 % (ref 12.0–46.0)
MCHC: 32.8 g/dL (ref 30.0–36.0)
MCV: 92.8 fl (ref 78.0–100.0)
Monocytes Absolute: 0.7 10*3/uL (ref 0.1–1.0)
Monocytes Relative: 7.9 % (ref 3.0–12.0)
NEUTROS PCT: 61.1 % (ref 43.0–77.0)
Neutro Abs: 5.1 10*3/uL (ref 1.4–7.7)
PLATELETS: 260 10*3/uL (ref 150.0–400.0)
RBC: 4.18 Mil/uL (ref 3.87–5.11)
RDW: 12.9 % (ref 11.5–14.6)
WBC: 8.4 10*3/uL (ref 4.5–10.5)

## 2013-11-22 LAB — BASIC METABOLIC PANEL
BUN: 21 mg/dL (ref 6–23)
CALCIUM: 9.8 mg/dL (ref 8.4–10.5)
CO2: 29 mEq/L (ref 19–32)
Chloride: 99 mEq/L (ref 96–112)
Creatinine, Ser: 0.9 mg/dL (ref 0.4–1.2)
GFR: 70.85 mL/min (ref >60.00)
GLUCOSE: 91 mg/dL (ref 70–99)
POTASSIUM: 4.3 meq/L (ref 3.5–5.1)
SODIUM: 135 meq/L (ref 135–145)

## 2013-11-22 LAB — URINALYSIS, ROUTINE W REFLEX MICROSCOPIC
BILIRUBIN URINE: NEGATIVE
Hgb urine dipstick: NEGATIVE
KETONES UR: NEGATIVE
NITRITE: NEGATIVE
PH: 6 (ref 5.0–8.0)
SPECIFIC GRAVITY, URINE: 1.025 (ref 1.000–1.030)
Total Protein, Urine: NEGATIVE
UROBILINOGEN UA: 0.2 (ref 0.0–1.0)
Urine Glucose: NEGATIVE

## 2013-11-22 LAB — TSH: TSH: 2.73 u[IU]/mL (ref 0.35–5.50)

## 2013-11-22 NOTE — Progress Notes (Signed)
Pre visit review using our clinic review tool, if applicable. No additional management support is needed unless otherwise documented below in the visit note. 

## 2013-11-22 NOTE — Assessment & Plan Note (Signed)
Labs

## 2013-11-22 NOTE — Assessment & Plan Note (Signed)
Continue with current prescription therapy as reflected on the Med list.  

## 2013-11-22 NOTE — Progress Notes (Signed)
Subjective:    HPI  C/o R shoulder pain x 2 mo C/o stress w/a 68 yo GD  The patient presents for a follow-up of  chronic hypertension, chronic dyslipidemia,OA controlled with medicine  S/p  R THR on 04-06-11  BP Readings from Last 3 Encounters:  11/22/13 138/70  05/21/13 138/80  01/31/13 122/80   Wt Readings from Last 3 Encounters:  11/22/13 164 lb (74.39 kg)  05/21/13 165 lb (74.844 kg)  01/31/13 165 lb 8 oz (75.07 kg)     Past Medical History  Diagnosis Date  . HTN (hypertension)   . Colon polyps   . Breast cyst 2008    Left / SER  . Constipation   . LBP (low back pain)     ? Spinal stenosis  . Osteoarthritis   . Osteoarthritis of hip 2011    Right/Dr Fields   Past Surgical History  Procedure Laterality Date  . Joint replacement  7/12    R THR    reports that she has never smoked. She does not have any smokeless tobacco history on file. She reports that she does not drink alcohol or use illicit drugs. family history includes Breast cancer in her other; Hypertension in her other. No Known Allergies Current Outpatient Prescriptions on File Prior to Visit  Medication Sig Dispense Refill  . aspirin 81 MG EC tablet Take 81 mg by mouth daily.        . benazepril (LOTENSIN) 40 MG tablet TAKE 1 TABLET BY MOUTH DAILY  30 tablet  5  . CALCIUM-VITAMIN D PO Take by mouth. Calcium 1200 mg + Vitamin D 800      . Cholecalciferol 1000 UNITS tablet Take 1,000 Units by mouth every other day. Tues, Thurs & Sat      . fish oil-omega-3 fatty acids 1000 MG capsule Take 1 g by mouth daily.        . Magnesium 250 MG TABS Take by mouth daily.      . psyllium (METAMUCIL) 58.6 % packet Take 1 packet by mouth daily.        Marland Kitchen triamterene-hydrochlorothiazide (MAXZIDE-25) 37.5-25 MG per tablet TAKE 1 TABLET BY MOUTH DAILY  30 tablet  5   No current facility-administered medications on file prior to visit.   BP 138/70  Pulse 76  Temp(Src) 99.1 F (37.3 C) (Oral)  Resp 16  Wt 164 lb  (74.39 kg)  Review of Systems  Constitutional: Negative for fever, chills, diaphoresis, activity change, appetite change, fatigue and unexpected weight change.  HENT: Negative for congestion, dental problem, ear pain, hearing loss, mouth sores, postnasal drip, sinus pressure, sneezing, sore throat and voice change.   Eyes: Negative for pain and visual disturbance.  Respiratory: Negative for cough, chest tightness, wheezing and stridor.   Cardiovascular: Negative for chest pain, palpitations and leg swelling.  Gastrointestinal: Negative for nausea, vomiting, abdominal pain, blood in stool, abdominal distention and rectal pain.  Genitourinary: Negative for dysuria, hematuria, decreased urine volume, vaginal bleeding, vaginal discharge, difficulty urinating, vaginal pain and menstrual problem.  Musculoskeletal: Negative for arthralgias, back pain, gait problem, joint swelling and neck pain.  Skin: Negative for color change, rash and wound.  Neurological: Negative for dizziness, tremors, syncope, speech difficulty and light-headedness.  Hematological: Negative for adenopathy.  Psychiatric/Behavioral: Negative for suicidal ideas, hallucinations, behavioral problems, confusion, sleep disturbance, dysphoric mood and decreased concentration. The patient is not nervous/anxious and is not hyperactive.   2 moles on L neck  Objective:   Physical Exam  Constitutional: She appears well-developed. No distress.  Obese   HENT:  Head: Normocephalic.  Right Ear: External ear normal.  Left Ear: External ear normal.  Nose: Nose normal.  Mouth/Throat: Oropharynx is clear and moist.  Eyes: Conjunctivae are normal. Pupils are equal, round, and reactive to light. Right eye exhibits no discharge. Left eye exhibits no discharge.  Neck: Normal range of motion. Neck supple. No JVD present. No tracheal deviation present. No thyromegaly present.  Cardiovascular: Normal rate, regular rhythm and normal heart sounds.    Pulmonary/Chest: No stridor. No respiratory distress. She has no wheezes.  Abdominal: Soft. Bowel sounds are normal. She exhibits no distension and no mass. There is no tenderness. There is no rebound and no guarding.  Musculoskeletal: She exhibits no edema and no tenderness (R hip is NT).  Lymphadenopathy:    She has no cervical adenopathy.  Neurological: She displays normal reflexes. No cranial nerve deficit. She exhibits normal muscle tone. Coordination normal.  Skin: No rash noted. No erythema.  Psychiatric: She has a normal mood and affect. Her behavior is normal. Judgment and thought content normal.  2 moles on L neck  Lab Results  Component Value Date   WBC 7.6 05/14/2013   HGB 12.3 05/14/2013   HCT 35.4* 05/14/2013   PLT 259.0 05/14/2013   CHOL 160 05/14/2013   TRIG 100.0 05/14/2013   HDL 35.60* 05/14/2013   LDLDIRECT 150.6 10/15/2007   ALT 34 05/14/2013   AST 28 05/14/2013   NA 136 05/14/2013   K 4.4 05/14/2013   CL 104 05/14/2013   CREATININE 0.9 05/14/2013   BUN 21 05/14/2013   CO2 26 05/14/2013   TSH 3.68 05/14/2013   INR 0.97 03/29/2011           Assessment & Plan:

## 2013-11-22 NOTE — Assessment & Plan Note (Signed)
MSK ROM exercisis Declined inj, Rx

## 2013-11-25 ENCOUNTER — Other Ambulatory Visit: Payer: Self-pay | Admitting: Internal Medicine

## 2013-11-25 ENCOUNTER — Encounter: Payer: Self-pay | Admitting: Internal Medicine

## 2013-11-25 MED ORDER — CIPROFLOXACIN HCL 250 MG PO TABS: 250.0000 mg | ORAL_TABLET | Freq: Two times a day (BID) | ORAL | Status: DC

## 2014-04-21 ENCOUNTER — Other Ambulatory Visit: Payer: Self-pay | Admitting: Gynecology

## 2014-04-21 DIAGNOSIS — R1900 Intra-abdominal and pelvic swelling, mass and lump, unspecified site: Secondary | ICD-10-CM

## 2014-04-23 ENCOUNTER — Ambulatory Visit
Admission: RE | Admit: 2014-04-23 | Discharge: 2014-04-23 | Disposition: A | Discharge status: Home / Self Care | Payer: PRIVATE HEALTH INSURANCE | Source: Ambulatory Visit | Attending: Gynecology | Admitting: Gynecology

## 2014-04-23 DIAGNOSIS — R1900 Intra-abdominal and pelvic swelling, mass and lump, unspecified site: Secondary | ICD-10-CM

## 2014-05-06 ENCOUNTER — Encounter (INDEPENDENT_AMBULATORY_CARE_PROVIDER_SITE_OTHER): Payer: Self-pay | Admitting: Surgery

## 2014-05-06 ENCOUNTER — Ambulatory Visit (INDEPENDENT_AMBULATORY_CARE_PROVIDER_SITE_OTHER): Payer: PRIVATE HEALTH INSURANCE | Admitting: Surgery

## 2014-05-06 ENCOUNTER — Telehealth (INDEPENDENT_AMBULATORY_CARE_PROVIDER_SITE_OTHER): Payer: Self-pay

## 2014-05-06 VITALS — BP 140/72 | HR 78 | Temp 98.6°F | Resp 18 | Ht 60.0 in | Wt 165.0 lb

## 2014-05-06 DIAGNOSIS — N6452 Nipple discharge: Secondary | ICD-10-CM | POA: Insufficient documentation

## 2014-05-06 DIAGNOSIS — N6459 Other signs and symptoms in breast: Secondary | ICD-10-CM

## 2014-05-06 NOTE — Telephone Encounter (Signed)
Films to out box for return to [facility].

## 2014-05-06 NOTE — Progress Notes (Signed)
Patient ID: Alexandra Scott, female   DOB: 08-28-1946, 68 y.o.   MRN: [id number]  Chief Complaint  Patient presents with  . New Evaluation    eval left breast nipple discharge    HPI Alexandra Scott is a 68 y.o. female.  Referred by Dr. Delene Loll (GYN) and Dr. Isaiah Blakes for left nipple discharge with suspicious ductogram  HPI This is a 68 year old female in good health who presents with a five-week history of occasional brownish discharge from her left nipple. She brought this to the attention of her gynecologist who referred her for a ductogram. Her last mammogram in December was unremarkable. The ductogram from 04/16/14 showed a duct in the retroareolar 9:00 position with some mild distal duct ectasia with multiple tiny nodular filling defects from 9 mm on the nipple to a distance of about 3.5 cm. She presents now for discussion of duct excision.  Menarche age 58 First pregnancy age 72 She did not breast-feed Menopause age 13 She has never taken hormone pills. Past Medical History  Diagnosis Date  . HTN (hypertension)   . Colon polyps   . Breast cyst 2008    Left / SER  . Constipation   . LBP (low back pain)     ? Spinal stenosis  . Osteoarthritis   . Osteoarthritis of hip 2011    Right/Dr Fields    Past Surgical History  Procedure Laterality Date  . Joint replacement  7/12    R THR    Family History  Problem Relation Age of Onset  . Hypertension Other   . Breast cancer Other   Breast cancer in paternal aunt  Social History History  Substance Use Topics  . Smoking status: Never Smoker   . Smokeless tobacco: Not on file  . Alcohol Use: No    Allergies  Allergen Reactions  . Cortisone Anxiety    Current Outpatient Prescriptions  Medication Sig Dispense Refill  . aspirin 81 MG EC tablet Take 81 mg by mouth daily.        . benazepril (LOTENSIN) 40 MG tablet TAKE 1 TABLET BY MOUTH DAILY  30 tablet  5  . CALCIUM-VITAMIN D PO Take by mouth. Calcium 1200 mg +  Vitamin D 800      . Cholecalciferol 1000 UNITS tablet Take 1,000 Units by mouth every other day. Tues, Thurs & Sat      . fish oil-omega-3 fatty acids 1000 MG capsule Take 1 g by mouth daily.        . Magnesium 250 MG TABS Take by mouth daily.      . psyllium (METAMUCIL) 58.6 % packet Take 1 packet by mouth daily.        Marland Kitchen triamterene-hydrochlorothiazide (MAXZIDE-25) 37.5-25 MG per tablet TAKE 1 TABLET BY MOUTH DAILY  30 tablet  5   No current facility-administered medications for this visit.    Review of Systems Review of Systems  Constitutional: Negative for fever, chills and unexpected weight change.  HENT: Negative for congestion, hearing loss, sore throat, trouble swallowing and voice change.   Eyes: Negative for visual disturbance.  Respiratory: Negative for cough and wheezing.   Cardiovascular: Negative for chest pain, palpitations and leg swelling.  Gastrointestinal: Negative for nausea, vomiting, abdominal pain, diarrhea, constipation, blood in stool, abdominal distention and anal bleeding.  Genitourinary: Negative for hematuria, vaginal bleeding and difficulty urinating.  Musculoskeletal: Negative for arthralgias.  Skin: Negative for rash and wound.  Neurological: Negative for seizures, syncope and headaches.  Hematological: Negative for adenopathy. Does not bruise/bleed easily.  Psychiatric/Behavioral: Negative for confusion.    Blood pressure 140/72, pulse 78, temperature 98.6 F (37 C), resp. rate 18, height 5' (1.524 m), weight 165 lb (74.844 kg).  Physical Exam Physical Exam WDWN in NAD HEENT:  EOMI, sclera anicteric Neck:  No masses, no thyromegaly Lungs:  CTA bilaterally; normal respiratory effort Breasts:  Symmetric; no palpable masses in either breast; no axillary lymphadenopathy; no right nipple retraction or discharge; left nipple - prominent central opening with minimal clear discharge CV:  Regular rate and rhythm; no murmurs Abd:  +bowel sounds, soft,  non-tender, no masses Ext:  Well-perfused; no edema Skin:  Warm, dry; no sign of jaundice  Data Reviewed Ductogram 04/16/14  Assessment    Left nipple duct discharge (?bloody?) with suspicious ductogram     Plan    Left nipple duct excision.  The surgical procedure has been discussed with the patient.  Potential risks, benefits, alternative treatments, and expected outcomes have been explained.  All of the patient's questions at this time have been answered.  The likelihood of reaching the patient's treatment goal is good.  The patient understand the proposed surgical procedure and wishes to proceed.         Alexandra Scott K. 05/06/2014, 11:05 AM

## 2014-05-12 ENCOUNTER — Other Ambulatory Visit: Payer: Self-pay | Admitting: Internal Medicine

## 2014-05-12 ENCOUNTER — Ambulatory Visit (INDEPENDENT_AMBULATORY_CARE_PROVIDER_SITE_OTHER): Payer: PRIVATE HEALTH INSURANCE | Admitting: General Surgery

## 2014-05-19 ENCOUNTER — Telehealth: Payer: Self-pay | Admitting: Internal Medicine

## 2014-05-19 NOTE — Telephone Encounter (Signed)
Pt is scheduled for LEFT NIPPLE DUCT EXCISION on 05/22/14, pt would like to know if she needs to keep the 05/23/14, 6 mth rov appointment or change to a later date?? Please advise  Ok to leave detailed message if needed.

## 2014-05-20 NOTE — Telephone Encounter (Signed)
Pls resc +1 month. Good luck w/the procedure! Thx

## 2014-05-21 ENCOUNTER — Encounter (HOSPITAL_COMMUNITY)
Admission: RE | Admit: 2014-05-21 | Discharge: 2014-05-21 | Disposition: A | Discharge status: Home / Self Care | Payer: PRIVATE HEALTH INSURANCE | Source: Ambulatory Visit | Attending: Surgery | Admitting: Surgery

## 2014-05-21 ENCOUNTER — Ambulatory Visit (HOSPITAL_COMMUNITY)
Admission: RE | Admit: 2014-05-21 | Discharge: 2014-05-21 | Disposition: A | Discharge status: Home / Self Care | Payer: PRIVATE HEALTH INSURANCE | Source: Ambulatory Visit | Attending: Anesthesiology | Admitting: Anesthesiology

## 2014-05-21 ENCOUNTER — Encounter (HOSPITAL_COMMUNITY): Payer: Self-pay

## 2014-05-21 DIAGNOSIS — I10 Essential (primary) hypertension: Secondary | ICD-10-CM | POA: Diagnosis not present

## 2014-05-21 DIAGNOSIS — N6049 Mammary duct ectasia of unspecified breast: Secondary | ICD-10-CM | POA: Diagnosis not present

## 2014-05-21 DIAGNOSIS — N6459 Other signs and symptoms in breast: Secondary | ICD-10-CM | POA: Diagnosis not present

## 2014-05-21 DIAGNOSIS — Z96649 Presence of unspecified artificial hip joint: Secondary | ICD-10-CM | POA: Diagnosis not present

## 2014-05-21 DIAGNOSIS — Z803 Family history of malignant neoplasm of breast: Secondary | ICD-10-CM | POA: Diagnosis not present

## 2014-05-21 DIAGNOSIS — K219 Gastro-esophageal reflux disease without esophagitis: Secondary | ICD-10-CM | POA: Diagnosis not present

## 2014-05-21 DIAGNOSIS — Z6831 Body mass index (BMI) 31.0-31.9, adult: Secondary | ICD-10-CM | POA: Diagnosis not present

## 2014-05-21 HISTORY — DX: Gastro-esophageal reflux disease without esophagitis: K21.9

## 2014-05-21 LAB — BASIC METABOLIC PANEL
ANION GAP: 14 (ref 5–15)
BUN: 26 mg/dL — ABNORMAL HIGH (ref 6–23)
CHLORIDE: 99 meq/L (ref 96–112)
CO2: 26 meq/L (ref 19–32)
CREATININE: 1.03 mg/dL (ref 0.50–1.10)
Calcium: 9.6 mg/dL (ref 8.4–10.5)
GFR calc Af Amer: 64 mL/min — ABNORMAL LOW (ref >90)
GFR calc non Af Amer: 55 mL/min — ABNORMAL LOW (ref >90)
Glucose, Bld: 91 mg/dL (ref 70–99)
Potassium: 3.9 mEq/L (ref 3.7–5.3)
SODIUM: 139 meq/L (ref 137–147)

## 2014-05-21 LAB — CBC
HEMATOCRIT: 38.2 % (ref 36.0–46.0)
HEMOGLOBIN: 12.8 g/dL (ref 12.0–15.0)
MCH: 30.6 pg (ref 26.0–34.0)
MCHC: 33.5 g/dL (ref 30.0–36.0)
MCV: 91.4 fL (ref 78.0–100.0)
Platelets: 249 10*3/uL (ref 150–400)
RBC: 4.18 MIL/uL (ref 3.87–5.11)
RDW: 12.4 % (ref 11.5–15.5)
WBC: 8 10*3/uL (ref 4.0–10.5)

## 2014-05-21 MED ORDER — CEFAZOLIN SODIUM-DEXTROSE 2-3 GM-% IV SOLR
2.0000 g | INTRAVENOUS | Status: AC
  Administered 2014-05-22: 2 g via INTRAVENOUS
  Filled 2014-05-21: qty 50

## 2014-05-21 MED ORDER — CHLORHEXIDINE GLUCONATE 4 % EX LIQD: 1.0000 "application " | Freq: Once | CUTANEOUS | Status: DC

## 2014-05-21 NOTE — Pre-Procedure Instructions (Signed)
MARGARETANN ABATE  05/21/2014   Your procedure is scheduled on:  Thursday August 27 th at 1450 PM  Report to [facility] at 1250 PM.  Call this number if you have problems the morning of surgery: [phone]   Remember:   Do not eat food or drink liquids after midnight.   Take these medicines the morning of surgery with A SIP OF WATER: None Stop Aspirin, Fish oil, Vitamins Herbal Medication, and Nsaids today.   Do not wear jewelry, make-up or nail polish.  Do not wear lotions, powders, or perfumes. You may wear deodorant.  Do not shave 48 hours prior to surgery.  Do not bring valuables to the hospital.  [facility] is not responsible  for any belongings or valuables.               Contacts, dentures or bridgework may not be worn into surgery.  Leave suitcase in the car. After surgery it may be brought to your room.  For patients admitted to the hospital, discharge time is determined by your  treatment team.               Patients discharged the day of surgery will not be allowed to drive home.    Special Instructions: [facility] - Preparing for Surgery  Before surgery, you can play an important role.  Because skin is not sterile, your skin needs to be as free of germs as possible.  You can reduce the number of germs on you skin by washing with CHG (chlorahexidine gluconate) soap before surgery.  CHG is an antiseptic cleaner which kills germs and bonds with the skin to continue killing germs even after washing.  Please DO NOT use if you have an allergy to CHG or antibacterial soaps.  If your skin becomes reddened/irritated stop using the CHG and inform your nurse when you arrive at Short Stay.  Do not shave (including legs and underarms) for at least 48 hours prior to the first CHG shower.  You may shave your face.  Please follow these instructions carefully:   1.  Shower with CHG Soap the night before surgery and the                                morning of  Surgery.  2.  If you choose to wash your hair, wash your hair first as usual with your       normal shampoo.  3.  After you shampoo, rinse your hair and body thoroughly to remove the                      Shampoo.  4.  Use CHG as you would any other liquid soap.  You can apply chg directly       to the skin and wash gently with scrungie or a clean washcloth.  5.  Apply the CHG Soap to your body ONLY FROM THE NECK DOWN.        Do not use on open wounds or open sores.  Avoid contact with your eyes,       ears, mouth and genitals (private parts).  Wash genitals (private parts)       with your normal soap.  6.  Wash thoroughly, paying special attention to the area where your surgery        will be performed.  7.  Thoroughly  rinse your body with warm water from the neck down.  8.  DO NOT shower/wash with your normal soap after using and rinsing off       the CHG Soap.  9.  Pat yourself dry with a clean towel.            10.  Wear clean pajamas.            11.  Place clean sheets on your bed the night of your first shower and do not        sleep with pets.  Day of Surgery  Do not apply any lotions/deoderants the morning of surgery.  Please wear clean clothes to the hospital/surgery center.      Please read over the following fact sheets that you were given: Pain Booklet, Coughing and Deep Breathing and Surgical Site Infection Prevention

## 2014-05-21 NOTE — Telephone Encounter (Signed)
Pt has been rescheduled. 

## 2014-05-22 ENCOUNTER — Encounter (HOSPITAL_COMMUNITY): Payer: PRIVATE HEALTH INSURANCE | Admitting: Anesthesiology

## 2014-05-22 ENCOUNTER — Ambulatory Visit (HOSPITAL_COMMUNITY): Payer: PRIVATE HEALTH INSURANCE | Admitting: Anesthesiology

## 2014-05-22 ENCOUNTER — Ambulatory Visit (HOSPITAL_COMMUNITY)
Admission: RE | Admit: 2014-05-22 | Discharge: 2014-05-22 | Disposition: A | Discharge status: Home / Self Care | Payer: PRIVATE HEALTH INSURANCE | Source: Ambulatory Visit | Attending: Surgery | Admitting: Surgery

## 2014-05-22 ENCOUNTER — Encounter (HOSPITAL_COMMUNITY)
Admission: RE | Disposition: A | Discharge status: Home / Self Care | Payer: Self-pay | Source: Ambulatory Visit | Attending: Surgery

## 2014-05-22 ENCOUNTER — Encounter (HOSPITAL_COMMUNITY): Payer: Self-pay | Admitting: *Deleted

## 2014-05-22 DIAGNOSIS — K219 Gastro-esophageal reflux disease without esophagitis: Secondary | ICD-10-CM | POA: Insufficient documentation

## 2014-05-22 DIAGNOSIS — N6049 Mammary duct ectasia of unspecified breast: Secondary | ICD-10-CM | POA: Insufficient documentation

## 2014-05-22 DIAGNOSIS — Z96649 Presence of unspecified artificial hip joint: Secondary | ICD-10-CM | POA: Insufficient documentation

## 2014-05-22 DIAGNOSIS — Z803 Family history of malignant neoplasm of breast: Secondary | ICD-10-CM | POA: Insufficient documentation

## 2014-05-22 DIAGNOSIS — N6459 Other signs and symptoms in breast: Secondary | ICD-10-CM | POA: Insufficient documentation

## 2014-05-22 DIAGNOSIS — N6452 Nipple discharge: Secondary | ICD-10-CM

## 2014-05-22 DIAGNOSIS — D249 Benign neoplasm of unspecified breast: Secondary | ICD-10-CM

## 2014-05-22 DIAGNOSIS — Z6831 Body mass index (BMI) 31.0-31.9, adult: Secondary | ICD-10-CM | POA: Insufficient documentation

## 2014-05-22 DIAGNOSIS — I10 Essential (primary) hypertension: Secondary | ICD-10-CM | POA: Insufficient documentation

## 2014-05-22 HISTORY — PX: BREAST DUCTAL SYSTEM EXCISION: SHX5242

## 2014-05-22 SURGERY — EXCISION DUCTAL SYSTEM BREAST
Anesthesia: General | Site: Breast | Laterality: Left

## 2014-05-22 MED ORDER — MORPHINE SULFATE 2 MG/ML IJ SOLN: 2.0000 mg | INTRAMUSCULAR | Status: DC | PRN

## 2014-05-22 MED ORDER — OXYCODONE HCL 5 MG/5ML PO SOLN: 5.0000 mg | Freq: Once | ORAL | Status: DC | PRN

## 2014-05-22 MED ORDER — PHENYLEPHRINE HCL 10 MG/ML IJ SOLN
10.0000 mg | INTRAVENOUS | Status: DC | PRN
  Administered 2014-05-22: 60 ug/min via INTRAVENOUS

## 2014-05-22 MED ORDER — 0.9 % SODIUM CHLORIDE (POUR BTL) OPTIME
TOPICAL | Status: DC | PRN
  Administered 2014-05-22: 1000 mL

## 2014-05-22 MED ORDER — BUPIVACAINE HCL (PF) 0.25 % IJ SOLN
INTRAMUSCULAR | Status: DC | PRN
  Administered 2014-05-22: 10 mL

## 2014-05-22 MED ORDER — MIDAZOLAM HCL 5 MG/5ML IJ SOLN
INTRAMUSCULAR | Status: DC | PRN
  Administered 2014-05-22 (×2): 1 mg via INTRAVENOUS

## 2014-05-22 MED ORDER — ONDANSETRON HCL 4 MG/2ML IJ SOLN
INTRAMUSCULAR | Status: DC | PRN
  Administered 2014-05-22: 4 mg via INTRAVENOUS

## 2014-05-22 MED ORDER — LIDOCAINE HCL (CARDIAC) 20 MG/ML IV SOLN
INTRAVENOUS | Status: AC
  Filled 2014-05-22: qty 5

## 2014-05-22 MED ORDER — FENTANYL CITRATE 0.05 MG/ML IJ SOLN
INTRAMUSCULAR | Status: AC
  Filled 2014-05-22: qty 5

## 2014-05-22 MED ORDER — PROPOFOL 10 MG/ML IV BOLUS
INTRAVENOUS | Status: DC | PRN
Start: 2014-05-22 — End: 2014-05-22
  Administered 2014-05-22: 150 mg via INTRAVENOUS

## 2014-05-22 MED ORDER — MIDAZOLAM HCL 2 MG/2ML IJ SOLN
INTRAMUSCULAR | Status: AC
  Filled 2014-05-22: qty 2

## 2014-05-22 MED ORDER — HYDROCODONE-ACETAMINOPHEN 5-325 MG PO TABS: 1.0000 | ORAL_TABLET | ORAL | Status: DC | PRN

## 2014-05-22 MED ORDER — MIDAZOLAM HCL 2 MG/2ML IJ SOLN: 0.5000 mg | Freq: Once | INTRAMUSCULAR | Status: DC | PRN

## 2014-05-22 MED ORDER — ROCURONIUM BROMIDE 50 MG/5ML IV SOLN
INTRAVENOUS | Status: AC
  Filled 2014-05-22: qty 1

## 2014-05-22 MED ORDER — PROPOFOL 10 MG/ML IV BOLUS
INTRAVENOUS | Status: AC
  Filled 2014-05-22: qty 20

## 2014-05-22 MED ORDER — PHENYLEPHRINE HCL 10 MG/ML IJ SOLN
INTRAMUSCULAR | Status: DC | PRN
  Administered 2014-05-22 (×4): 80 ug via INTRAVENOUS

## 2014-05-22 MED ORDER — MEPERIDINE HCL 25 MG/ML IJ SOLN: 6.2500 mg | INTRAMUSCULAR | Status: DC | PRN

## 2014-05-22 MED ORDER — OXYCODONE HCL 5 MG PO TABS: 5.0000 mg | ORAL_TABLET | Freq: Once | ORAL | Status: DC | PRN

## 2014-05-22 MED ORDER — HYDROMORPHONE HCL PF 1 MG/ML IJ SOLN: 0.2500 mg | INTRAMUSCULAR | Status: DC | PRN

## 2014-05-22 MED ORDER — GLYCOPYRROLATE 0.2 MG/ML IJ SOLN
INTRAMUSCULAR | Status: AC
  Filled 2014-05-22: qty 2

## 2014-05-22 MED ORDER — ONDANSETRON HCL 4 MG/2ML IJ SOLN
INTRAMUSCULAR | Status: AC
  Filled 2014-05-22: qty 2

## 2014-05-22 MED ORDER — ARTIFICIAL TEARS OP OINT
TOPICAL_OINTMENT | OPHTHALMIC | Status: AC
  Filled 2014-05-22: qty 3.5

## 2014-05-22 MED ORDER — FENTANYL CITRATE 0.05 MG/ML IJ SOLN
INTRAMUSCULAR | Status: DC | PRN
  Administered 2014-05-22: 50 ug via INTRAVENOUS

## 2014-05-22 MED ORDER — ONDANSETRON HCL 4 MG/2ML IJ SOLN: 4.0000 mg | INTRAMUSCULAR | Status: DC | PRN

## 2014-05-22 MED ORDER — LACTATED RINGERS IV SOLN
INTRAVENOUS | Status: DC
  Administered 2014-05-22 (×2): via INTRAVENOUS

## 2014-05-22 MED ORDER — PROMETHAZINE HCL 25 MG/ML IJ SOLN: 6.2500 mg | INTRAMUSCULAR | Status: DC | PRN

## 2014-05-22 MED ORDER — LIDOCAINE HCL (CARDIAC) 20 MG/ML IV SOLN
INTRAVENOUS | Status: DC | PRN
  Administered 2014-05-22: 60 mg via INTRAVENOUS

## 2014-05-22 SURGICAL SUPPLY — 50 items
APL SKNCLS STERI-STRIP NONHPOA (GAUZE/BANDAGES/DRESSINGS) ×1
BENZOIN TINCTURE PRP APPL 2/3 (GAUZE/BANDAGES/DRESSINGS) ×3 IMPLANT
BLADE SURG 10 STRL SS (BLADE) ×2 IMPLANT
BLADE SURG 15 STRL LF DISP TIS (BLADE) IMPLANT
BLADE SURG 15 STRL SS (BLADE) ×3
CANISTER SUCTION 2500CC (MISCELLANEOUS) ×3 IMPLANT
CHLORAPREP W/TINT 26ML (MISCELLANEOUS) ×3 IMPLANT
CLOSURE WOUND 1/2 X4 (GAUZE/BANDAGES/DRESSINGS) ×1
CONT SPEC 4OZ CLIKSEAL STRL BL (MISCELLANEOUS) IMPLANT
COVER SURGICAL LIGHT HANDLE (MISCELLANEOUS) ×3 IMPLANT
DRAPE PED LAPAROTOMY (DRAPES) ×3 IMPLANT
DRAPE UTILITY 15X26 W/TAPE STR (DRAPE) ×6 IMPLANT
DRSG TEGADERM 4X4.75 (GAUZE/BANDAGES/DRESSINGS) ×2 IMPLANT
ELECT CAUTERY BLADE 6.4 (BLADE) ×3 IMPLANT
ELECT REM PT RETURN 9FT ADLT (ELECTROSURGICAL) ×3
ELECTRODE REM PT RTRN 9FT ADLT (ELECTROSURGICAL) ×1 IMPLANT
GAUZE SPONGE 2X2 8PLY STRL LF (GAUZE/BANDAGES/DRESSINGS) ×1 IMPLANT
GLOVE BIO SURGEON STRL SZ7 (GLOVE) ×3 IMPLANT
GLOVE BIOGEL PI IND STRL 7.0 (GLOVE) IMPLANT
GLOVE BIOGEL PI IND STRL 7.5 (GLOVE) ×1 IMPLANT
GLOVE BIOGEL PI INDICATOR 7.0 (GLOVE) ×6
GLOVE BIOGEL PI INDICATOR 7.5 (GLOVE) ×2
GLOVE SURG SS PI 7.0 STRL IVOR (GLOVE) ×4 IMPLANT
GOWN STRL REUS W/ TWL LRG LVL3 (GOWN DISPOSABLE) ×2 IMPLANT
GOWN STRL REUS W/TWL LRG LVL3 (GOWN DISPOSABLE) ×9
KIT BASIN OR (CUSTOM PROCEDURE TRAY) ×3 IMPLANT
KIT MARKER MARGIN INK (KITS) ×2 IMPLANT
KIT ROOM TURNOVER OR (KITS) ×3 IMPLANT
NDL HYPO 25GX1X1/2 BEV (NEEDLE) ×1 IMPLANT
NEEDLE HYPO 25GX1X1/2 BEV (NEEDLE) ×3 IMPLANT
NS IRRIG 1000ML POUR BTL (IV SOLUTION) ×3 IMPLANT
PACK SURGICAL SETUP 50X90 (CUSTOM PROCEDURE TRAY) ×3 IMPLANT
PAD ARMBOARD 7.5X6 YLW CONV (MISCELLANEOUS) ×6 IMPLANT
PENCIL BUTTON HOLSTER BLD 10FT (ELECTRODE) ×3 IMPLANT
SPECIMEN JAR MEDIUM (MISCELLANEOUS) ×2 IMPLANT
SPONGE GAUZE 2X2 STER 10/PKG (GAUZE/BANDAGES/DRESSINGS) ×2
SPONGE LAP 4X18 X RAY DECT (DISPOSABLE) ×3 IMPLANT
STRIP CLOSURE SKIN 1/2X4 (GAUZE/BANDAGES/DRESSINGS) ×2 IMPLANT
SUT MON AB 4-0 PC3 18 (SUTURE) ×3 IMPLANT
SUT VIC AB 3-0 SH 27 (SUTURE) ×3
SUT VIC AB 3-0 SH 27X BRD (SUTURE) ×1 IMPLANT
SYR BULB 3OZ (MISCELLANEOUS) ×3 IMPLANT
SYR CONTROL 10ML LL (SYRINGE) ×3 IMPLANT
TAPE CLOTH SURG 4X10 WHT LF (GAUZE/BANDAGES/DRESSINGS) ×2 IMPLANT
TOWEL OR 17X24 6PK STRL BLUE (TOWEL DISPOSABLE) ×1 IMPLANT
TOWEL OR 17X26 10 PK STRL BLUE (TOWEL DISPOSABLE) ×3 IMPLANT
TUBE CONNECTING 12'X1/4 (SUCTIONS) ×1
TUBE CONNECTING 12X1/4 (SUCTIONS) ×2 IMPLANT
WATER STERILE IRR 1000ML POUR (IV SOLUTION) IMPLANT
YANKAUER SUCT BULB TIP NO VENT (SUCTIONS) ×3 IMPLANT

## 2014-05-22 NOTE — Op Note (Signed)
Preop diagnosis: Left nipple drainage Postop diagnosis: Same Procedure performed: Left nipple duct excision Surgeon:Kandon Hosking K. Anesthesia: Gen. Via LMA Indications: This is a 68 year old female who presented with recent onset of spontaneous left nipple discharge. There is no sign of bloody material in the drainage. She underwent a ductogram which shows some filling defects along the memory duct from a distance of about 5 mm to 3.5 cm behind the nipple. She does not for nipple duct excision.  Description of procedure: The patient brought to the operating room and placed in a supine position on the operating room table. After an adequate level of general anesthesia was obtained, her left breast was prepped with ChloraPrep and draped sterile fashion. A timeout was taken to ensure the proper patient and proper procedure. I was able to express some clear yellow drainage from the center of her nipple. This duct was cannulated with a lacrimal duct probe. I made a curvilinear incision around the lateral side of her areole. Dissection was carried medially until we encountered the duct in question. This duct was grasped with a clamp, the probe was removed, and the duct was indicated from the back of the skin of the nipple. I then excised a cone of breast tissue for a depth of about 4 cm. The specimen was excised completely and oriented with a paint kit. This was sent for pathologic examination. We irrigated and inspected for hemostasis. The wound is closed with 3-0 Vicryl as well as 4-0 Monocryl. Steri-Strips and clean dressings were applied. The patient was then extubated and brought to the recovery room in stable condition. All sponge, instrument, and needle counts are correct.  Imogene Burn. Georgette Dover, MD, [facility]  General/ Trauma Surgery  05/22/2014 3:23 PM

## 2014-05-22 NOTE — H&P (View-Only) (Signed)
Patient ID: Alexandra Scott, female   DOB: 1946/06/06, 69 y.o.   MRN: [id number]  Chief Complaint  Patient presents with  . New Evaluation    eval left breast nipple discharge    HPI Alexandra Scott is a 68 y.o. female.  Referred by Dr. Delene Loll (GYN) and Dr. Isaiah Blakes for left nipple discharge with suspicious ductogram  HPI This is a 68 year old female in good health who presents with a five-week history of occasional brownish discharge from her left nipple. She brought this to the attention of her gynecologist who referred her for a ductogram. Her last mammogram in December was unremarkable. The ductogram from 04/16/14 showed a duct in the retroareolar 9:00 position with some mild distal duct ectasia with multiple tiny nodular filling defects from 9 mm on the nipple to a distance of about 3.5 cm. She presents now for discussion of duct excision.  Menarche age 66 First pregnancy age 50 She did not breast-feed Menopause age 20 She has never taken hormone pills. Past Medical History  Diagnosis Date  . HTN (hypertension)   . Colon polyps   . Breast cyst 2008    Left / SER  . Constipation   . LBP (low back pain)     ? Spinal stenosis  . Osteoarthritis   . Osteoarthritis of hip 2011    Right/Dr Fields    Past Surgical History  Procedure Laterality Date  . Joint replacement  7/12    R THR    Family History  Problem Relation Age of Onset  . Hypertension Other   . Breast cancer Other   Breast cancer in paternal aunt  Social History History  Substance Use Topics  . Smoking status: Never Smoker   . Smokeless tobacco: Not on file  . Alcohol Use: No    Allergies  Allergen Reactions  . Cortisone Anxiety    Current Outpatient Prescriptions  Medication Sig Dispense Refill  . aspirin 81 MG EC tablet Take 81 mg by mouth daily.        . benazepril (LOTENSIN) 40 MG tablet TAKE 1 TABLET BY MOUTH DAILY  30 tablet  5  . CALCIUM-VITAMIN D PO Take by mouth. Calcium 1200 mg +  Vitamin D 800      . Cholecalciferol 1000 UNITS tablet Take 1,000 Units by mouth every other day. Tues, Thurs & Sat      . fish oil-omega-3 fatty acids 1000 MG capsule Take 1 g by mouth daily.        . Magnesium 250 MG TABS Take by mouth daily.      . psyllium (METAMUCIL) 58.6 % packet Take 1 packet by mouth daily.        Marland Kitchen triamterene-hydrochlorothiazide (MAXZIDE-25) 37.5-25 MG per tablet TAKE 1 TABLET BY MOUTH DAILY  30 tablet  5   No current facility-administered medications for this visit.    Review of Systems Review of Systems  Constitutional: Negative for fever, chills and unexpected weight change.  HENT: Negative for congestion, hearing loss, sore throat, trouble swallowing and voice change.   Eyes: Negative for visual disturbance.  Respiratory: Negative for cough and wheezing.   Cardiovascular: Negative for chest pain, palpitations and leg swelling.  Gastrointestinal: Negative for nausea, vomiting, abdominal pain, diarrhea, constipation, blood in stool, abdominal distention and anal bleeding.  Genitourinary: Negative for hematuria, vaginal bleeding and difficulty urinating.  Musculoskeletal: Negative for arthralgias.  Skin: Negative for rash and wound.  Neurological: Negative for seizures, syncope and headaches.  Hematological: Negative for adenopathy. Does not bruise/bleed easily.  Psychiatric/Behavioral: Negative for confusion.    Blood pressure 140/72, pulse 78, temperature 98.6 F (37 C), resp. rate 18, height 5' (1.524 m), weight 165 lb (74.844 kg).  Physical Exam Physical Exam WDWN in NAD HEENT:  EOMI, sclera anicteric Neck:  No masses, no thyromegaly Lungs:  CTA bilaterally; normal respiratory effort Breasts:  Symmetric; no palpable masses in either breast; no axillary lymphadenopathy; no right nipple retraction or discharge; left nipple - prominent central opening with minimal clear discharge CV:  Regular rate and rhythm; no murmurs Abd:  +bowel sounds, soft,  non-tender, no masses Ext:  Well-perfused; no edema Skin:  Warm, dry; no sign of jaundice  Data Reviewed Ductogram 04/16/14  Assessment    Left nipple duct discharge (?bloody?) with suspicious ductogram     Plan    Left nipple duct excision.  The surgical procedure has been discussed with the patient.  Potential risks, benefits, alternative treatments, and expected outcomes have been explained.  All of the patient's questions at this time have been answered.  The likelihood of reaching the patient's treatment goal is good.  The patient understand the proposed surgical procedure and wishes to proceed.         Basel Defalco K. 05/06/2014, 11:05 AM

## 2014-05-22 NOTE — Anesthesia Preprocedure Evaluation (Addendum)
Anesthesia Evaluation  Patient identified by MRN, date of birth, ID band Patient awake    Reviewed: Allergy & Precautions, H&P , NPO status , Patient's Chart, lab work & pertinent test results  Airway Mallampati: III TM Distance: >3 FB Neck ROM: Full    Dental  (+) Teeth Intact, Dental Advisory Given   Pulmonary neg pulmonary ROS,  breath sounds clear to auscultation        Cardiovascular METS: 3 - Mets hypertension, Pt. on medications Rhythm:Regular Rate:Normal     Neuro/Psych Chronic back pain negative neurological ROS  negative psych ROS   GI/Hepatic Neg liver ROS, GERD-  ,  Endo/Other  Morbid obesity  Renal/GU negative Renal ROS  negative genitourinary   Musculoskeletal negative musculoskeletal ROS (+)   Abdominal   Peds  Hematology negative hematology ROS (+)   Anesthesia Other Findings   Reproductive/Obstetrics negative OB ROS                        Anesthesia Physical Anesthesia Plan  ASA: II  Anesthesia Plan: General   Post-op Pain Management:    Induction: Intravenous  Airway Management Planned: LMA  Additional Equipment: None  Intra-op Plan:   Post-operative Plan:   Informed Consent: I have reviewed the patients History and Physical, chart, labs and discussed the procedure including the risks, benefits and alternatives for the proposed anesthesia with the patient or authorized representative who has indicated his/her understanding and acceptance.   Dental advisory given  Plan Discussed with: CRNA and Surgeon  Anesthesia Plan Comments:         Anesthesia Quick Evaluation

## 2014-05-22 NOTE — Transfer of Care (Signed)
Immediate Anesthesia Transfer of Care Note  Patient: Alexandra Scott  Procedure(s) Performed: Procedure(s): LEFT NIPPLE DUCT EXCISION (Left)  Patient Location: PACU  Anesthesia Type:General  Level of Consciousness: awake, sedated and patient cooperative  Airway & Oxygen Therapy: Patient Spontanous Breathing and Patient connected to nasal cannula oxygen  Post-op Assessment: Report given to PACU RN and Post -op Vital signs reviewed and stable  Post vital signs: Reviewed and stable  Complications: No apparent anesthesia complications

## 2014-05-22 NOTE — Anesthesia Postprocedure Evaluation (Signed)
  Anesthesia Post-op Note  Patient: Alexandra Scott  Procedure(s) Performed: Procedure(s): LEFT NIPPLE DUCT EXCISION (Left)  Patient Location: PACU  Anesthesia Type:General  Level of Consciousness: awake, alert  and oriented  Airway and Oxygen Therapy: Patient Spontanous Breathing  Post-op Pain: mild  Post-op Assessment: Post-op Vital signs reviewed  Post-op Vital Signs: Reviewed  Last Vitals:  Filed Vitals:   05/22/14 1545  BP: 110/63  Pulse: 76  Temp: 36.6 C  Resp: 18    Complications: No apparent anesthesia complications

## 2014-05-22 NOTE — Anesthesia Procedure Notes (Signed)
Procedure Name: LMA Insertion Date/Time: 05/22/2014 2:07 PM Performed by: Williemae Area B Pre-anesthesia Checklist: Patient identified, Emergency Drugs available, Suction available, Patient being monitored and Timeout performed Patient Re-evaluated:Patient Re-evaluated prior to inductionOxygen Delivery Method: Circle system utilized Preoxygenation: Pre-oxygenation with 100% oxygen Ventilation: Mask ventilation without difficulty LMA: LMA inserted LMA Size: 4.0 Number of attempts: 1 Placement Confirmation: positive ETCO2 Tube secured with: Tape Dental Injury: Teeth and Oropharynx as per pre-operative assessment

## 2014-05-22 NOTE — Discharge Instructions (Signed)
General Anesthesia, Adult, Care After  Refer to this sheet in the next few weeks. These instructions provide you with information on caring for yourself after your procedure. Your health care provider may also give you more specific instructions. Your treatment has been planned according to current medical practices, but problems sometimes occur. Call your health care provider if you have any problems or questions after your procedure.  WHAT TO EXPECT AFTER THE PROCEDURE  After the procedure, it is typical to experience:  Sleepiness.  Nausea and vomiting. HOME CARE INSTRUCTIONS  For the first 24 hours after general anesthesia:  Have a responsible person with you.  Do not drive a car. If you are alone, do not take public transportation.  Do not drink alcohol.  Do not take medicine that has not been prescribed by your health care provider.  Do not sign important papers or make important decisions.  You may resume a normal diet and activities as directed by your health care provider.  Change bandages (dressings) as directed.  If you have questions or problems that seem related to general anesthesia, call the hospital and ask for the anesthetist or anesthesiologist on call. SEEK MEDICAL CARE IF:  You have nausea and vomiting that continue the day after anesthesia.  You develop a rash. SEEK IMMEDIATE MEDICAL CARE IF:  You have difficulty breathing.  You have chest pain.  You have any allergic problems. Document Released: 12/19/2000 Document Revised: 05/15/2013 Document Reviewed: 03/28/2013  [facility] Patient Information 2014 [place], [place].   What to eat:  For your first meals, you should eat lightly; only small meals initially.  If you do not have nausea, you may eat larger meals.  Avoid spicy, greasy and heavy food.    Tissue Adhesive Wound Care    Some cuts and wounds can be closed with tissue adhesive. Adhesive is like glue. It holds the skin together and helps a wound heal faster.  This adhesive goes away on its own as the wound heals.  HOME CARE  Showers are allowed. Do not soak the wound in water. Do not take baths, swim, or use hot tubs. Do not use soaps or creams on your wound.  If a bandage (dressing) was put on, change it as often as told by your doctor.  Keep the bandage dry.  Do not scratch, pick, or rub the adhesive.  Do not put tape over the adhesive. The adhesive could come off.  Protect the wound from another injury.  Protect the wound from sun and tanning beds.  Only take medicine as told by your doctor.  Keep all doctor visits as told. GET HELP RIGHT AWAY IF:  Your wound is red, puffy (swollen), hot, or tender.  You get a rash after the glue is put on.  You have more pain in the wound.  You have a red streak going away from the wound.  You have yellowish-white fluid (pus) coming from the wound.  You have more bleeding.  You have a fever.  You have chills and start to shake.  You notice a bad smell coming from the wound.  Your wound or adhesive breaks open. MAKE SURE YOU:  Understand these instructions.  Will watch your condition.  Will get help right away if you are not doing well or get worse. Document Released: 06/21/2008 Document Revised: 07/03/2013 Document Reviewed: 04/03/2013  [facility] Patient Information 2015 [place], [place]. This information is not intended to replace advice given to you by your health care provider.  Make sure you discuss any questions you have with your health care provider.   [place] Office Phone Number [phone]  BREAST BIOPSY/ PARTIAL MASTECTOMY: POST OP INSTRUCTIONS  Always review your discharge instruction sheet given to you by the facility where your surgery was performed.  IF YOU HAVE DISABILITY OR FAMILY LEAVE FORMS, YOU MUST BRING THEM TO THE OFFICE FOR PROCESSING.  DO NOT GIVE THEM TO YOUR DOCTOR.  1. A prescription for pain medication may be given to you upon discharge.  Take your  pain medication as prescribed, if needed.  If narcotic pain medicine is not needed, then you may take acetaminophen (Tylenol) or ibuprofen (Advil) as needed. 2. Take your usually prescribed medications unless otherwise directed 3. If you need a refill on your pain medication, please contact your pharmacy.  They will contact our office to request authorization.  Prescriptions will not be filled after 5pm or on week-ends. 4. You should eat very light the first 24 hours after surgery, such as soup, crackers, pudding, etc.  Resume your normal diet the day after surgery. 5. Most patients will experience some swelling and bruising in the breast.  Ice packs and a good support bra will help.  Swelling and bruising can take several days to resolve.  6. It is common to experience some constipation if taking pain medication after surgery.  Increasing fluid intake and taking a stool softener will usually help or prevent this problem from occurring.  A mild laxative (Milk of Magnesia or Miralax) should be taken according to package directions if there are no bowel movements after 48 hours. 7. Unless discharge instructions indicate otherwise, you may remove your bandages 48 hours after surgery, and you may shower at that time.  You will have steri-strips (small skin tapes) in place directly over the incision.  These strips should be left on the skin for 7-10 days.   Any sutures or staples will be removed at the office during your follow-up visit. 8. ACTIVITIES:  You may resume regular daily activities (gradually increasing) beginning the next day.  Wearing a good support bra or sports bra minimizes pain and swelling.  You may have sexual intercourse when it is comfortable. a. You may drive when you no longer are taking prescription pain medication, you can comfortably wear a seatbelt, and you can safely maneuver your car and apply brakes. b. RETURN TO WORK:  1-2 weeks 9. You should see your doctor in the office for a  follow-up appointment approximately two weeks after your surgery.  Your doctors nurse will typically make your follow-up appointment when she calls you with your pathology report.  Expect your pathology report 2-3 business days after your surgery.  You may call to check if you do not hear from [place] after three days. 10. OTHER INSTRUCTIONS: _______________________________________________________________________________________________ _____________________________________________________________________________________________________________________________________ _____________________________________________________________________________________________________________________________________ _____________________________________________________________________________________________________________________________________  WHEN TO CALL YOUR DOCTOR: 1. Fever over 101.0 2. Nausea and/or vomiting. 3. Extreme swelling or bruising. 4. Continued bleeding from incision. 5. Increased pain, redness, or drainage from the incision.  The clinic staff is available to answer your questions during regular business hours.  Please dont hesitate to call and ask to speak to one of the nurses for clinical concerns.  If you have a medical emergency, go to the nearest emergency room or call 911.  A surgeon from [facility] is always on call at the hospital.  For further questions, please visit [email or web address]

## 2014-05-22 NOTE — Interval H&P Note (Signed)
History and Physical Interval Note:  05/22/2014 1:44 PM  Alexandra Scott  has presented today for surgery, with the diagnosis of left nipple drainage  The various methods of treatment have been discussed with the patient and family. After consideration of risks, benefits and other options for treatment, the patient has consented to  Procedure(s): LEFT NIPPLE DUCT EXCISION (Left) as a surgical intervention .  The patient's history has been reviewed, patient examined, no change in status, stable for surgery.  I have reviewed the patient's chart and labs.  Questions were answered to the patient's satisfaction.     [name] K.

## 2014-05-23 ENCOUNTER — Ambulatory Visit: Payer: PRIVATE HEALTH INSURANCE | Admitting: Internal Medicine

## 2014-05-23 ENCOUNTER — Encounter (HOSPITAL_COMMUNITY): Payer: Self-pay | Admitting: Surgery

## 2014-05-26 ENCOUNTER — Encounter: Payer: Self-pay | Admitting: Gynecology

## 2014-05-28 ENCOUNTER — Encounter: Payer: Self-pay | Admitting: Gastroenterology

## 2014-06-09 ENCOUNTER — Encounter (INDEPENDENT_AMBULATORY_CARE_PROVIDER_SITE_OTHER): Payer: PRIVATE HEALTH INSURANCE | Admitting: Surgery

## 2014-06-12 ENCOUNTER — Other Ambulatory Visit: Payer: Self-pay | Admitting: Gynecology

## 2014-06-30 ENCOUNTER — Ambulatory Visit (INDEPENDENT_AMBULATORY_CARE_PROVIDER_SITE_OTHER): Payer: PRIVATE HEALTH INSURANCE | Admitting: Internal Medicine

## 2014-06-30 ENCOUNTER — Encounter: Payer: Self-pay | Admitting: Internal Medicine

## 2014-06-30 VITALS — BP 140/80 | HR 80 | Temp 98.5°F | Resp 16 | Wt 163.0 lb

## 2014-06-30 DIAGNOSIS — M545 Low back pain: Secondary | ICD-10-CM

## 2014-06-30 DIAGNOSIS — Z23 Encounter for immunization: Secondary | ICD-10-CM

## 2014-06-30 DIAGNOSIS — I10 Essential (primary) hypertension: Secondary | ICD-10-CM

## 2014-06-30 MED ORDER — BENAZEPRIL HCL 40 MG PO TABS: 40.0000 mg | ORAL_TABLET | Freq: Every day | ORAL | Status: DC

## 2014-06-30 MED ORDER — TRIAMTERENE-HCTZ 37.5-25 MG PO TABS: 1.0000 | ORAL_TABLET | Freq: Every day | ORAL | Status: DC

## 2014-06-30 NOTE — Progress Notes (Signed)
Pre visit review using our clinic review tool, if applicable. No additional management support is needed unless otherwise documented below in the visit note. 

## 2014-06-30 NOTE — Progress Notes (Signed)
Subjective:    HPI   F/u stress w/a 68 yo GD  The patient presents for a follow-up of  chronic hypertension, chronic dyslipidemia,OA controlled with medicine. Alexandra Scott had a uterine bx and a breast bx.  S/p  R THR on 04-06-11  BP Readings from Last 3 Encounters:  06/30/14 140/80  05/22/14 118/68  05/22/14 118/68   Wt Readings from Last 3 Encounters:  06/30/14 163 lb (73.936 kg)  05/22/14 163 lb (73.936 kg)  05/22/14 163 lb (73.936 kg)     Past Medical History  Diagnosis Date  . HTN (hypertension)   . Colon polyps   . Breast cyst 2008    Left / SER  . Constipation   . LBP (low back pain)     ? Spinal stenosis  . Osteoarthritis   . Osteoarthritis of hip 2011    Right/Dr Fields  . GERD (gastroesophageal reflux disease)     uses OTC   Past Surgical History  Procedure Laterality Date  . Joint replacement  7/12    R THR  . Colonoscopy w/ biopsies and polypectomy    . Breast ductal system excision Left 05/22/2014    Procedure: LEFT NIPPLE DUCT EXCISION;  Surgeon: Imogene Burn. Georgette Dover, MD;  Location: [facility];  Service: General;  Laterality: Left;    reports that she has never smoked. She has never used smokeless tobacco. She reports that she does not drink alcohol or use illicit drugs. family history includes Breast cancer in her other; Hypertension in her other. Allergies  Allergen Reactions  . Cortisone Anxiety   Current Outpatient Prescriptions on File Prior to Visit  Medication Sig Dispense Refill  . aspirin 81 MG EC tablet Take 81 mg by mouth daily.        . benazepril (LOTENSIN) 40 MG tablet Take 40 mg by mouth daily.      Marland Kitchen HYDROcodone-acetaminophen (NORCO/VICODIN) 5-325 MG per tablet Take 1 tablet by mouth every 4 (four) hours as needed.  40 tablet  0  . triamterene-hydrochlorothiazide (MAXZIDE-25) 37.5-25 MG per tablet Take 1 tablet by mouth daily.       No current facility-administered medications on file prior to visit.   BP 140/80  Pulse 80  Temp(Src)  98.5 F (36.9 C) (Oral)  Resp 16  Wt 163 lb (73.936 kg)  Review of Systems  Constitutional: Negative for fever, chills, diaphoresis, activity change, appetite change, fatigue and unexpected weight change.  HENT: Negative for congestion, dental problem, ear pain, hearing loss, mouth sores, postnasal drip, sinus pressure, sneezing, sore throat and voice change.   Eyes: Negative for pain and visual disturbance.  Respiratory: Negative for cough, chest tightness, wheezing and stridor.   Cardiovascular: Negative for chest pain, palpitations and leg swelling.  Gastrointestinal: Negative for nausea, vomiting, abdominal pain, blood in stool, abdominal distention and rectal pain.  Genitourinary: Negative for dysuria, hematuria, decreased urine volume, vaginal bleeding, vaginal discharge, difficulty urinating, vaginal pain and menstrual problem.  Musculoskeletal: Negative for arthralgias, back pain, gait problem, joint swelling and neck pain.  Skin: Negative for color change, rash and wound.  Neurological: Negative for dizziness, tremors, syncope, speech difficulty and light-headedness.  Hematological: Negative for adenopathy.  Psychiatric/Behavioral: Negative for suicidal ideas, hallucinations, behavioral problems, confusion, sleep disturbance, dysphoric mood and decreased concentration. The patient is not nervous/anxious and is not hyperactive.   2 moles on L neck     Objective:   Physical Exam  Constitutional: She appears well-developed. No distress.  Obese  HENT:  Head: Normocephalic.  Right Ear: External ear normal.  Left Ear: External ear normal.  Nose: Nose normal.  Mouth/Throat: Oropharynx is clear and moist.  Eyes: Conjunctivae are normal. Pupils are equal, round, and reactive to light. Right eye exhibits no discharge. Left eye exhibits no discharge.  Neck: Normal range of motion. Neck supple. No JVD present. No tracheal deviation present. No thyromegaly present.  Cardiovascular:  Normal rate, regular rhythm and normal heart sounds.   Pulmonary/Chest: No stridor. No respiratory distress. She has no wheezes.  Abdominal: Soft. Bowel sounds are normal. She exhibits no distension and no mass. There is no tenderness. There is no rebound and no guarding.  Musculoskeletal: She exhibits no edema and no tenderness (R hip is NT).  Lymphadenopathy:    She has no cervical adenopathy.  Neurological: She displays normal reflexes. No cranial nerve deficit. She exhibits normal muscle tone. Coordination normal.  Skin: No rash noted. No erythema.  Psychiatric: She has a normal mood and affect. Her behavior is normal. Judgment and thought content normal.  2 moles on L neck  Lab Results  Component Value Date   WBC 8.0 05/21/2014   HGB 12.8 05/21/2014   HCT 38.2 05/21/2014   PLT 249 05/21/2014   CHOL 160 05/14/2013   TRIG 100.0 05/14/2013   HDL 35.60* 05/14/2013   LDLDIRECT 150.6 10/15/2007   ALT 34 05/14/2013   AST 28 05/14/2013   NA 139 05/21/2014   K 3.9 05/21/2014   CL 99 05/21/2014   CREATININE 1.03 05/21/2014   BUN 26* 05/21/2014   CO2 26 05/21/2014   TSH 2.73 11/22/2013   INR 0.97 03/29/2011           Assessment & Plan:

## 2014-07-01 NOTE — Assessment & Plan Note (Signed)
Better  

## 2014-07-01 NOTE — Assessment & Plan Note (Signed)
Continue with current prescription therapy as reflected on the Med list.  

## 2014-09-02 ENCOUNTER — Other Ambulatory Visit (INDEPENDENT_AMBULATORY_CARE_PROVIDER_SITE_OTHER): Payer: Self-pay | Admitting: *Deleted

## 2014-09-02 DIAGNOSIS — D369 Benign neoplasm, unspecified site: Secondary | ICD-10-CM

## 2014-09-22 ENCOUNTER — Encounter: Payer: Self-pay | Admitting: Internal Medicine

## 2014-12-17 ENCOUNTER — Telehealth: Payer: Self-pay | Admitting: Internal Medicine

## 2014-12-17 DIAGNOSIS — Z0000 Encounter for general adult medical examination without abnormal findings: Secondary | ICD-10-CM

## 2014-12-17 NOTE — Telephone Encounter (Signed)
Pt just called into let us know that she is on company ins and wanted Dr Camila Li to put orders for cpe before visit

## 2014-12-17 NOTE — Telephone Encounter (Signed)
OK CBC, CMET, TSH, UA, lipids Thx

## 2014-12-18 NOTE — Telephone Encounter (Signed)
Notified pt md has place lab orders...Alexandra Scott

## 2014-12-30 ENCOUNTER — Other Ambulatory Visit (INDEPENDENT_AMBULATORY_CARE_PROVIDER_SITE_OTHER): Payer: PRIVATE HEALTH INSURANCE

## 2014-12-30 DIAGNOSIS — Z0000 Encounter for general adult medical examination without abnormal findings: Secondary | ICD-10-CM

## 2014-12-30 DIAGNOSIS — Z0189 Encounter for other specified special examinations: Secondary | ICD-10-CM

## 2014-12-30 LAB — URINALYSIS, ROUTINE W REFLEX MICROSCOPIC
BILIRUBIN URINE: NEGATIVE
Hgb urine dipstick: NEGATIVE
Ketones, ur: NEGATIVE
Nitrite: NEGATIVE
PH: 7 (ref 5.0–8.0)
Specific Gravity, Urine: 1.01 (ref 1.000–1.030)
Total Protein, Urine: NEGATIVE
URINE GLUCOSE: NEGATIVE
UROBILINOGEN UA: 0.2 (ref 0.0–1.0)

## 2014-12-30 LAB — CBC WITH DIFFERENTIAL/PLATELET
BASOS PCT: 0.3 % (ref 0.0–3.0)
Basophils Absolute: 0 10*3/uL (ref 0.0–0.1)
EOS PCT: 6.1 % — AB (ref 0.0–5.0)
Eosinophils Absolute: 0.5 10*3/uL (ref 0.0–0.7)
HCT: 37.6 % (ref 36.0–46.0)
HEMOGLOBIN: 12.9 g/dL (ref 12.0–15.0)
Lymphocytes Relative: 31.5 % (ref 12.0–46.0)
Lymphs Abs: 2.3 10*3/uL (ref 0.7–4.0)
MCHC: 34.2 g/dL (ref 30.0–36.0)
MCV: 89.9 fl (ref 78.0–100.0)
MONOS PCT: 9 % (ref 3.0–12.0)
Monocytes Absolute: 0.7 10*3/uL (ref 0.1–1.0)
NEUTROS ABS: 3.9 10*3/uL (ref 1.4–7.7)
Neutrophils Relative %: 53.1 % (ref 43.0–77.0)
Platelets: 241 10*3/uL (ref 150.0–400.0)
RBC: 4.18 Mil/uL (ref 3.87–5.11)
RDW: 12.8 % (ref 11.5–15.5)
WBC: 7.4 10*3/uL (ref 4.0–10.5)

## 2014-12-30 LAB — HEPATIC FUNCTION PANEL
ALK PHOS: 66 U/L (ref 39–117)
ALT: 32 U/L (ref 0–35)
AST: 30 U/L (ref 0–37)
Albumin: 4.2 g/dL (ref 3.5–5.2)
BILIRUBIN DIRECT: 0.2 mg/dL (ref 0.0–0.3)
TOTAL PROTEIN: 7.3 g/dL (ref 6.0–8.3)
Total Bilirubin: 0.9 mg/dL (ref 0.2–1.2)

## 2014-12-30 LAB — BASIC METABOLIC PANEL
BUN: 17 mg/dL (ref 6–23)
CALCIUM: 9.5 mg/dL (ref 8.4–10.5)
CO2: 28 meq/L (ref 19–32)
Chloride: 100 mEq/L (ref 96–112)
Creatinine, Ser: 0.97 mg/dL (ref 0.40–1.20)
GFR: 60.63 mL/min (ref >60.00)
GLUCOSE: 91 mg/dL (ref 70–99)
POTASSIUM: 3.9 meq/L (ref 3.5–5.1)
SODIUM: 135 meq/L (ref 135–145)

## 2014-12-30 LAB — LIPID PANEL
CHOL/HDL RATIO: 4
Cholesterol: 167 mg/dL (ref 0–200)
HDL: 38.7 mg/dL — ABNORMAL LOW (ref >39.00)
LDL CALC: 102 mg/dL — AB (ref 0–99)
NonHDL: 128.3
Triglycerides: 130 mg/dL (ref 0.0–149.0)
VLDL: 26 mg/dL (ref 0.0–40.0)

## 2014-12-30 LAB — TSH: TSH: 3.92 u[IU]/mL (ref 0.35–4.50)

## 2015-01-06 ENCOUNTER — Ambulatory Visit (INDEPENDENT_AMBULATORY_CARE_PROVIDER_SITE_OTHER): Payer: PRIVATE HEALTH INSURANCE | Admitting: Internal Medicine

## 2015-01-06 ENCOUNTER — Encounter: Payer: Self-pay | Admitting: Internal Medicine

## 2015-01-06 VITALS — BP 130/80 | HR 72 | Temp 98.5°F | Resp 18 | Ht 60.0 in | Wt 165.0 lb

## 2015-01-06 DIAGNOSIS — Z0000 Encounter for general adult medical examination without abnormal findings: Secondary | ICD-10-CM

## 2015-01-06 DIAGNOSIS — R5382 Chronic fatigue, unspecified: Secondary | ICD-10-CM

## 2015-01-06 NOTE — Progress Notes (Signed)
Pre visit review using our clinic review tool, if applicable. No additional management support is needed unless otherwise documented below in the visit note. 

## 2015-01-06 NOTE — Assessment & Plan Note (Signed)
Poss deconditioning  Take a chair yoga class ore silver sneakers, walk CUT BACK ON CARBS

## 2015-01-06 NOTE — Patient Instructions (Signed)
Take a chair yoga class ore silver sneakers, walk CUT BACK ON CARBS

## 2015-01-06 NOTE — Progress Notes (Signed)
Subjective:    HPI   The patient is here for a wellness exam. C/o fatigue x months  The patient presents for a follow-up of  chronic hypertension, chronic dyslipidemia,OA controlled with medicine. Mrs Heng had a uterine bx and a breast bx.  S/p  R THR on 04-06-11  BP Readings from Last 3 Encounters:  01/06/15 130/80  06/30/14 140/80  05/22/14 118/68   Wt Readings from Last 3 Encounters:  01/06/15 165 lb (74.844 kg)  06/30/14 163 lb (73.936 kg)  05/22/14 163 lb (73.936 kg)     Past Medical History  Diagnosis Date  . HTN (hypertension)   . Colon polyps   . Breast cyst 2008    Left / SER  . Constipation   . LBP (low back pain)     ? Spinal stenosis  . Osteoarthritis   . Osteoarthritis of hip 2011    Right/Dr Fields  . GERD (gastroesophageal reflux disease)     uses OTC   Past Surgical History  Procedure Laterality Date  . Joint replacement  7/12    R THR  . Colonoscopy w/ biopsies and polypectomy    . Breast ductal system excision Left 05/22/2014    Procedure: LEFT NIPPLE DUCT EXCISION;  Surgeon: Imogene Burn. Georgette Dover, MD;  Location: [facility];  Service: General;  Laterality: Left;    reports that she has never smoked. She has never used smokeless tobacco. She reports that she does not drink alcohol or use illicit drugs. family history includes Breast cancer in her other; Hypertension in her other. Allergies  Allergen Reactions  . Cortisone Anxiety   Current Outpatient Prescriptions on File Prior to Visit  Medication Sig Dispense Refill  . aspirin 81 MG EC tablet Take 81 mg by mouth daily.      . benazepril (LOTENSIN) 40 MG tablet Take 1 tablet (40 mg total) by mouth daily. 30 tablet 11  . Cholecalciferol 1000 UNITS capsule Take 1,000 Units by mouth daily.    . Magnesium 250 MG TABS Take by mouth daily.    . Omega-3 Fatty Acids (FISH OIL) 1000 MG CAPS Take by mouth daily.    Marland Kitchen triamterene-hydrochlorothiazide (MAXZIDE-25) 37.5-25 MG per tablet Take 1 tablet by mouth  daily. 30 tablet 11  . vitamin C (ASCORBIC ACID) 500 MG tablet Take 500 mg by mouth daily.     No current facility-administered medications on file prior to visit.   BP 130/80 mmHg  Pulse 101  Temp(Src) 98.5 F (36.9 C) (Oral)  Resp 18  Ht 5' (1.524 m)  Wt 165 lb (74.844 kg)  BMI 32.22 kg/m2  SpO2 97%  Review of Systems  Constitutional: Negative for fever, chills, diaphoresis, activity change, appetite change, fatigue and unexpected weight change.  HENT: Negative for congestion, dental problem, ear pain, hearing loss, mouth sores, postnasal drip, sinus pressure, sneezing, sore throat and voice change.   Eyes: Negative for pain and visual disturbance.  Respiratory: Negative for cough, chest tightness, wheezing and stridor.   Cardiovascular: Negative for chest pain, palpitations and leg swelling.  Gastrointestinal: Negative for nausea, vomiting, abdominal pain, blood in stool, abdominal distention and rectal pain.  Genitourinary: Negative for dysuria, hematuria, decreased urine volume, vaginal bleeding, vaginal discharge, difficulty urinating, vaginal pain and menstrual problem.  Musculoskeletal: Negative for back pain, joint swelling, arthralgias, gait problem and neck pain.  Skin: Negative for color change, rash and wound.  Neurological: Negative for dizziness, tremors, syncope, speech difficulty and light-headedness.  Hematological: Negative for  adenopathy.  Psychiatric/Behavioral: Negative for suicidal ideas, hallucinations, behavioral problems, confusion, sleep disturbance, dysphoric mood and decreased concentration. The patient is not nervous/anxious and is not hyperactive.        Objective:   Physical Exam  Constitutional: She appears well-developed. No distress.  Obese   HENT:  Head: Normocephalic.  Right Ear: External ear normal.  Left Ear: External ear normal.  Nose: Nose normal.  Mouth/Throat: Oropharynx is clear and moist.  Eyes: Conjunctivae are normal. Pupils are  equal, round, and reactive to light. Right eye exhibits no discharge. Left eye exhibits no discharge.  Neck: Normal range of motion. Neck supple. No JVD present. No tracheal deviation present. No thyromegaly present.  Cardiovascular: Normal rate, regular rhythm and normal heart sounds.   Pulmonary/Chest: No stridor. No respiratory distress. She has no wheezes.  Abdominal: Soft. Bowel sounds are normal. She exhibits no distension and no mass. There is no tenderness. There is no rebound and no guarding.  Musculoskeletal: She exhibits no edema or tenderness (R hip is NT).  Lymphadenopathy:    She has no cervical adenopathy.  Neurological: She displays normal reflexes. No cranial nerve deficit. She exhibits normal muscle tone. Coordination normal.  Skin: No rash noted. No erythema.  Psychiatric: She has a normal mood and affect. Her behavior is normal. Judgment and thought content normal.  2 moles on L neck  Lab Results  Component Value Date   WBC 7.4 12/30/2014   HGB 12.9 12/30/2014   HCT 37.6 12/30/2014   PLT 241.0 12/30/2014   CHOL 167 12/30/2014   TRIG 130.0 12/30/2014   HDL 38.70* 12/30/2014   LDLDIRECT 150.6 10/15/2007   ALT 32 12/30/2014   AST 30 12/30/2014   NA 135 12/30/2014   K 3.9 12/30/2014   CL 100 12/30/2014   CREATININE 0.97 12/30/2014   BUN 17 12/30/2014   CO2 28 12/30/2014   TSH 3.92 12/30/2014   INR 0.97 03/29/2011           Assessment & Plan:

## 2015-01-06 NOTE — Assessment & Plan Note (Signed)
We discussed age appropriate health related issues, including available/recomended screening tests and vaccinations. We discussed a need for adhering to healthy diet and exercise. Labs/EKG were reviewed/ordered. All questions were answered.  Zostavax suggested Colon 2011

## 2015-05-04 ENCOUNTER — Other Ambulatory Visit: Payer: Self-pay | Admitting: Obstetrics & Gynecology

## 2015-05-04 DIAGNOSIS — Z124 Encounter for screening for malignant neoplasm of cervix: Secondary | ICD-10-CM | POA: Diagnosis not present

## 2015-05-05 LAB — CYTOLOGY - PAP

## 2015-05-13 LAB — HM MAMMOGRAPHY

## 2015-05-14 ENCOUNTER — Encounter: Payer: Self-pay | Admitting: Internal Medicine

## 2015-05-25 ENCOUNTER — Encounter: Payer: Self-pay | Admitting: Gastroenterology

## 2015-06-16 ENCOUNTER — Ambulatory Visit (AMBULATORY_SURGERY_CENTER): Payer: Self-pay

## 2015-06-16 VITALS — Ht 60.0 in | Wt 163.0 lb

## 2015-06-16 DIAGNOSIS — Z800 Family history of malignant neoplasm of digestive organs: Secondary | ICD-10-CM

## 2015-06-16 NOTE — Progress Notes (Signed)
No egg or soy allergy.  No previous complications from anesthesia. No home O2. No diet meds. 

## 2015-06-30 ENCOUNTER — Ambulatory Visit (INDEPENDENT_AMBULATORY_CARE_PROVIDER_SITE_OTHER): Payer: PRIVATE HEALTH INSURANCE | Admitting: Internal Medicine

## 2015-06-30 ENCOUNTER — Encounter: Payer: Self-pay | Admitting: Internal Medicine

## 2015-06-30 ENCOUNTER — Other Ambulatory Visit (INDEPENDENT_AMBULATORY_CARE_PROVIDER_SITE_OTHER): Payer: PRIVATE HEALTH INSURANCE

## 2015-06-30 VITALS — BP 100/60 | HR 75 | Wt 162.0 lb

## 2015-06-30 DIAGNOSIS — R42 Dizziness and giddiness: Secondary | ICD-10-CM | POA: Diagnosis not present

## 2015-06-30 DIAGNOSIS — M5440 Lumbago with sciatica, unspecified side: Secondary | ICD-10-CM

## 2015-06-30 DIAGNOSIS — Z23 Encounter for immunization: Secondary | ICD-10-CM

## 2015-06-30 DIAGNOSIS — I10 Essential (primary) hypertension: Secondary | ICD-10-CM

## 2015-06-30 LAB — BASIC METABOLIC PANEL
BUN: 21 mg/dL (ref 6–23)
CALCIUM: 9.5 mg/dL (ref 8.4–10.5)
CHLORIDE: 98 meq/L (ref 96–112)
CO2: 26 meq/L (ref 19–32)
CREATININE: 0.93 mg/dL (ref 0.40–1.20)
GFR: 63.56 mL/min (ref >60.00)
GLUCOSE: 98 mg/dL (ref 70–99)
Potassium: 4.1 mEq/L (ref 3.5–5.1)
Sodium: 135 mEq/L (ref 135–145)

## 2015-06-30 NOTE — Assessment & Plan Note (Signed)
Maxzide - ok to use 1/2 if lightheaded Drink more water Labs

## 2015-06-30 NOTE — Addendum Note (Signed)
Addended by: Cassandria Anger on: 06/30/2015 08:06 AM   Modules accepted: Orders

## 2015-06-30 NOTE — Patient Instructions (Signed)
Maxzide - ok to use 1/2 tablet a day if lightheaded

## 2015-06-30 NOTE — Assessment & Plan Note (Signed)
Vit D 

## 2015-06-30 NOTE — Progress Notes (Signed)
Pre visit review using our clinic review tool, if applicable. No additional management support is needed unless otherwise documented below in the visit note. 

## 2015-06-30 NOTE — Assessment & Plan Note (Addendum)
On Benazepril On Benazepril, Maxzide - ok to use 1/2 if lightheaded

## 2015-06-30 NOTE — Addendum Note (Signed)
Addended by: Cresenciano Lick on: 06/30/2015 08:36 AM   Modules accepted: Orders

## 2015-06-30 NOTE — Progress Notes (Signed)
Subjective:  Patient ID: Alexandra Scott, female    DOB: 1946/04/19  Age: 69 y.o. MRN: [id number]  CC: No chief complaint on file.   HPI SAYAKA HOEPPNER presents for HTN, OA, fatigue f/u.  Outpatient Prescriptions Prior to Visit  Medication Sig Dispense Refill  . aspirin 81 MG EC tablet Take 81 mg by mouth daily.      . benazepril (LOTENSIN) 40 MG tablet Take 1 tablet (40 mg total) by mouth daily. 30 tablet 11  . Cholecalciferol 1000 UNITS capsule Take 1,000 Units by mouth daily.    . Magnesium 250 MG TABS Take by mouth daily.    . Omega-3 Fatty Acids (FISH OIL) 1000 MG CAPS Take by mouth daily.    Marland Kitchen triamterene-hydrochlorothiazide (MAXZIDE-25) 37.5-25 MG per tablet Take 1 tablet by mouth daily. 30 tablet 11  . vitamin C (ASCORBIC ACID) 500 MG tablet Take 500 mg by mouth daily.    . Bisacodyl (DULCOLAX PO) Take by mouth. For colonoscopy    . Polyethylene Glycol 3350 (MIRALAX PO) Take by mouth. For colonoscopy     No facility-administered medications prior to visit.    ROS Review of Systems  Constitutional: Positive for fatigue. Negative for chills, activity change, appetite change and unexpected weight change.  HENT: Negative for congestion, mouth sores and sinus pressure.   Eyes: Negative for visual disturbance.  Respiratory: Negative for cough and chest tightness.   Gastrointestinal: Negative for nausea and abdominal pain.  Genitourinary: Negative for frequency, difficulty urinating and vaginal pain.  Musculoskeletal: Positive for arthralgias. Negative for back pain and gait problem.  Skin: Negative for pallor and rash.  Neurological: Positive for light-headedness. Negative for dizziness, tremors, weakness, numbness and headaches.  Psychiatric/Behavioral: Negative for confusion, sleep disturbance and dysphoric mood. The patient is not nervous/anxious.     Objective:  BP 100/60 mmHg  Pulse 75  Wt 162 lb (73.483 kg)  SpO2 91%  BP Readings from Last 3 Encounters:    06/30/15 100/60  01/06/15 130/80  06/30/14 140/80    Wt Readings from Last 3 Encounters:  06/30/15 162 lb (73.483 kg)  06/16/15 163 lb (73.936 kg)  01/06/15 165 lb (74.844 kg)    Physical Exam  Constitutional: She appears well-developed. No distress.  HENT:  Head: Normocephalic.  Right Ear: External ear normal.  Left Ear: External ear normal.  Nose: Nose normal.  Mouth/Throat: Oropharynx is clear and moist.  Eyes: Conjunctivae are normal. Pupils are equal, round, and reactive to light. Right eye exhibits no discharge. Left eye exhibits no discharge.  Neck: Normal range of motion. Neck supple. No JVD present. No tracheal deviation present. No thyromegaly present.  Cardiovascular: Normal rate, regular rhythm and normal heart sounds.   Pulmonary/Chest: No stridor. No respiratory distress. She has no wheezes.  Abdominal: Soft. Bowel sounds are normal. She exhibits no distension and no mass. There is no tenderness. There is no rebound and no guarding.  Musculoskeletal: She exhibits no edema or tenderness.  Lymphadenopathy:    She has no cervical adenopathy.  Neurological: She displays normal reflexes. No cranial nerve deficit. She exhibits normal muscle tone. Coordination normal.  Skin: No rash noted. No erythema.  Psychiatric: She has a normal mood and affect. Her behavior is normal. Judgment and thought content normal.    Lab Results  Component Value Date   WBC 7.4 12/30/2014   HGB 12.9 12/30/2014   HCT 37.6 12/30/2014   PLT 241.0 12/30/2014   GLUCOSE 91 12/30/2014  CHOL 167 12/30/2014   TRIG 130.0 12/30/2014   HDL 38.70* 12/30/2014   LDLDIRECT 150.6 10/15/2007   LDLCALC 102* 12/30/2014   ALT 32 12/30/2014   AST 30 12/30/2014   NA 135 12/30/2014   K 3.9 12/30/2014   CL 100 12/30/2014   CREATININE 0.97 12/30/2014   BUN 17 12/30/2014   CO2 28 12/30/2014   TSH 3.92 12/30/2014   INR 0.97 03/29/2011    Dg Chest 2 View  05/21/2014   CLINICAL DATA:  Preop left nipple  duct excision  EXAM: CHEST  2 VIEW  COMPARISON:  03/25/2011  FINDINGS: The heart size and mediastinal contours are within normal limits. Both lungs are clear. The visualized skeletal structures are unremarkable.  IMPRESSION: No active cardiopulmonary disease.   Electronically Signed   By: Franchot Gallo M.D.   On: 05/21/2014 16:32    Assessment & Plan:   Diagnoses and all orders for this visit:  Essential hypertension  Lightheadedness  Low back pain with sciatica, sciatica laterality unspecified, unspecified back pain laterality, unspecified chronicity  I am having Ms. Blaize maintain her aspirin, vitamin C, Cholecalciferol, Fish Oil, Magnesium, benazepril, triamterene-hydrochlorothiazide, Polyethylene Glycol 3350 (MIRALAX PO), and Bisacodyl (DULCOLAX PO).  No orders of the defined types were placed in this encounter.     Follow-up: Return in about 4 months (around 10/31/2015) for a follow-up visit.  Walker Kehr, MD

## 2015-07-06 ENCOUNTER — Other Ambulatory Visit: Payer: Self-pay | Admitting: Internal Medicine

## 2015-07-07 ENCOUNTER — Ambulatory Visit: Payer: PRIVATE HEALTH INSURANCE | Admitting: Internal Medicine

## 2015-07-08 ENCOUNTER — Encounter: Payer: Self-pay | Admitting: Gastroenterology

## 2015-07-08 ENCOUNTER — Ambulatory Visit (AMBULATORY_SURGERY_CENTER): Payer: PRIVATE HEALTH INSURANCE | Admitting: Gastroenterology

## 2015-07-08 VITALS — BP 124/72 | HR 77 | Temp 98.8°F | Resp 25 | Ht 60.0 in | Wt 163.0 lb

## 2015-07-08 DIAGNOSIS — Z800 Family history of malignant neoplasm of digestive organs: Secondary | ICD-10-CM

## 2015-07-08 DIAGNOSIS — K635 Polyp of colon: Secondary | ICD-10-CM | POA: Diagnosis not present

## 2015-07-08 DIAGNOSIS — D125 Benign neoplasm of sigmoid colon: Secondary | ICD-10-CM | POA: Diagnosis not present

## 2015-07-08 DIAGNOSIS — Z1211 Encounter for screening for malignant neoplasm of colon: Secondary | ICD-10-CM | POA: Diagnosis present

## 2015-07-08 MED ORDER — SODIUM CHLORIDE 0.9 % IV SOLN: 500.0000 mL | INTRAVENOUS | Status: DC

## 2015-07-08 NOTE — Patient Instructions (Signed)
YOU HAD AN ENDOSCOPIC PROCEDURE TODAY AT [facility]:   Refer to the procedure report that was given to you for any specific questions about what was found during the examination.  If the procedure report does not answer your questions, please call your gastroenterologist to clarify.  If you requested that your care partner not be given the details of your procedure findings, then the procedure report has been included in a sealed envelope for you to review at your convenience later.  YOU SHOULD EXPECT: Some feelings of bloating in the abdomen. Passage of more gas than usual.  Walking can help get rid of the air that was put into your GI tract during the procedure and reduce the bloating. If you had a lower endoscopy (such as a colonoscopy or flexible sigmoidoscopy) you may notice spotting of blood in your stool or on the toilet paper. If you underwent a bowel prep for your procedure, you may not have a normal bowel movement for a few days.  Please Note:  You might notice some irritation and congestion in your nose or some drainage.  This is from the oxygen used during your procedure.  There is no need for concern and it should clear up in a day or so.  SYMPTOMS TO REPORT IMMEDIATELY:   Following lower endoscopy (colonoscopy or flexible sigmoidoscopy):  Excessive amounts of blood in the stool  Significant tenderness or worsening of abdominal pains  Swelling of the abdomen that is new, acute  Fever of 100F or higher  For urgent or emergent issues, a gastroenterologist can be reached at any hour by calling [phone].   DIET: Your first meal following the procedure should be a small meal and then it is ok to progress to your normal diet. Heavy or fried foods are harder to digest and may make you feel nauseous or bloated.  Likewise, meals heavy in dairy and vegetables can increase bloating.  Drink plenty of fluids but you should avoid alcoholic beverages for 24  hours.  ACTIVITY:  You should plan to take it easy for the rest of today and you should NOT DRIVE or use heavy machinery until tomorrow (because of the sedation medicines used during the test).    FOLLOW UP: Our staff will call the number listed on your records the next business day following your procedure to check on you and address any questions or concerns that you may have regarding the information given to you following your procedure. If we do not reach you, we will leave a message.  However, if you are feeling well and you are not experiencing any problems, there is no need to return our call.  We will assume that you have returned to your regular daily activities without incident.  If any biopsies were taken you will be contacted by phone or by letter within the next 1-3 weeks.  Please call us at [phone] if you have not heard about the biopsies in 3 weeks.    SIGNATURES/CONFIDENTIALITY: You and/or your care partner have signed paperwork which will be entered into your electronic medical record.  These signatures attest to the fact that that the information above on your After Visit Summary has been reviewed and is understood.  Full responsibility of the confidentiality of this discharge information lies with you and/or your care-partner.  Please review polyp and diverticulosis handouts provided. Resume current medications.

## 2015-07-08 NOTE — Progress Notes (Signed)
Transferred to recovery room. A/O x3, pleased with MAC.  VSS.  Report to wendy, RN. 

## 2015-07-08 NOTE — Progress Notes (Signed)
Called to room to assist during endoscopic procedure.  Patient ID and intended procedure confirmed with present staff. Received instructions for my participation in the procedure from the performing physician.  

## 2015-07-08 NOTE — Op Note (Signed)
[facility]  [address] [place], [id number]   COLONOSCOPY PROCEDURE REPORT  PATIENT: Alexandra Scott, Alexandra Scott  MR#: [id number] BIRTHDATE: [birth year] , 68  yrs. old GENDER: female ENDOSCOPIST: Ladene Artist, MD, [facility] PROCEDURE DATE:  07/08/2015 PROCEDURE:   Colonoscopy, screening and Colonoscopy with snare polypectomy First Screening Colonoscopy - Avg.  risk and is 50 yrs.  old or older - No.  Prior Negative Screening - Now for repeat screening. N/A  History of Adenoma - Now for follow-up colonoscopy & has been > or = to 3 yrs.  N/A  Polyps removed today? Yes ASA CLASS:   Class II INDICATIONS:Screening for colonic neoplasia and FH Colon or Rectal Adenocarcinoma. MEDICATIONS: Monitored anesthesia care and Propofol 175 mg IV DESCRIPTION OF PROCEDURE:   After the risks benefits and alternatives of the procedure were thoroughly explained, informed consent was obtained.  The digital rectal exam revealed no abnormalities of the rectum.   The LB [id number]  endoscope was introduced through the anus and advanced to the cecum, which was identified by both the appendix and ileocecal valve. No adverse events experienced.   The quality of the prep was adequate (Suprep was used)  The instrument was then slowly withdrawn as the colon was fully examined. Estimated blood loss is zero unless otherwise noted in this procedure report.    COLON FINDINGS: A sessile polyp measuring 6 mm in size was found in the sigmoid colon.  A polypectomy was performed with a cold snare. The resection was complete, the polyp tissue was completely retrieved and sent to histology.   There was severe diverticulosis noted in the sigmoid colon and descending colon with associated colonic spasm, colonic narrowing, muscular hypertrophy and tortuosity.   There was moderate diverticulosis noted in the transverse colon, at the hepatic flexure, and in the ascending colon.   The examination was otherwise  normal.  Retroflexed views revealed no abnormalities. The time to cecum = 3.4 Withdrawal time = 10.3   The scope was withdrawn and the procedure completed. COMPLICATIONS: There were no immediate complications.  ENDOSCOPIC IMPRESSION: 1.   Sessile polyp in the sigmoid colon; polypectomy performed with a cold snare 2.   Severe diverticulosis in the sigmoid colon and descending colon  3.   Moderate diverticulosis in the transverse colon, at the hepatic flexure, and in the ascending colon  RECOMMENDATIONS: 1.  Await pathology results 2.  High fiber diet with liberal fluid intake. 3.  Repeat Colonoscopy in 5 years.  eSigned:  Ladene Artist, MD, [facility] 07/08/2015 3:23 PM

## 2015-07-09 ENCOUNTER — Telehealth: Payer: Self-pay | Admitting: *Deleted

## 2015-07-09 NOTE — Telephone Encounter (Signed)
  Follow up Call-  Call back number 07/08/2015  Post procedure Call Back phone  # [phone]  Permission to leave phone message Yes     Patient questions:  Do you have a fever, pain , or abdominal swelling? No. Pain Score  0 *  Have you tolerated food without any problems? Yes.    Have you been able to return to your normal activities? Yes.    Do you have any questions about your discharge instructions: Diet   No. Medications  No. Follow up visit  No.  Do you have questions or concerns about your Care? No.  Actions: * If pain score is 4 or above: No action needed, pain <4.

## 2015-07-13 ENCOUNTER — Encounter: Payer: Self-pay | Admitting: Gastroenterology

## 2015-09-25 ENCOUNTER — Ambulatory Visit (INDEPENDENT_AMBULATORY_CARE_PROVIDER_SITE_OTHER): Payer: PRIVATE HEALTH INSURANCE | Admitting: Family

## 2015-09-25 ENCOUNTER — Encounter: Payer: Self-pay | Admitting: Family

## 2015-09-25 VITALS — BP 128/84 | HR 92 | Temp 98.4°F | Resp 18 | Ht 60.0 in | Wt 164.0 lb

## 2015-09-25 DIAGNOSIS — J069 Acute upper respiratory infection, unspecified: Secondary | ICD-10-CM | POA: Insufficient documentation

## 2015-09-25 MED ORDER — AMOXICILLIN 875 MG PO TABS: 875.0000 mg | ORAL_TABLET | Freq: Two times a day (BID) | ORAL | Status: DC

## 2015-09-25 NOTE — Patient Instructions (Signed)
Thank you for choosing [facility].  Summary/Instructions:  Your prescription(s) have been submitted to your pharmacy or been printed and provided for you. Please take as directed and contact our office if you believe you are having problem(s) with the medication(s) or have any questions.  If your symptoms worsen or fail to improve, please contact our office for further instruction, or in case of emergency go directly to the emergency room at the closest medical facility.   General Recommendations:    Please drink plenty of fluids.  Get plenty of rest   Sleep in humidified air  Use saline nasal sprays  Netti pot   OTC Medications:  Decongestants - helps relieve congestion   Flonase (generic fluticasone) or Nasacort (generic triamcinolone) - please make sure to use the "cross-over" technique at a 45 degree angle towards the opposite eye as opposed to straight up the nasal passageway.   Sudafed (generic pseudoephedrine - Note this is the one that is available behind the pharmacy counter); Products with phenylephrine (-PE) may also be used but is often not as effective as pseudoephedrine.   If you have HIGH BLOOD PRESSURE - Coricidin HBP; AVOID any product that is -D as this contains pseudoephedrine which may increase your blood pressure.  Afrin (oxymetazoline) every 6-8 hours for up to 3 days.   Allergies - helps relieve runny nose, itchy eyes and sneezing   Claritin (generic loratidine), Allegra (fexofenidine), or Zyrtec (generic cyrterizine) for runny nose. These medications should not cause drowsiness.  Note - Benadryl (generic diphenhydramine) may be used however may cause drowsiness  Cough -   Delsym or Robitussin (generic dextromethorphan)  Expectorants - helps loosen mucus to ease removal   Mucinex (generic guaifenesin) as directed on the package.  Headaches / General Aches   Tylenol (generic acetaminophen) - DO NOT EXCEED 3 grams (3,000 mg) in a 24  hour time period  Advil/Motrin (generic ibuprofen)   Sore Throat -   Salt water gargle   Chloraseptic (generic benzocaine) spray or lozenges / Sucrets (generic dyclonine)      

## 2015-09-25 NOTE — Assessment & Plan Note (Signed)
Symptoms and exam consistent with acute upper respiratory infection. Start amoxicillin. Continue over-the-counter medications as needed for symptom relief and supportive care. Follow-up if symptoms worsen or fail to improve.

## 2015-09-25 NOTE — Progress Notes (Signed)
Subjective:    Patient ID: Alexandra Scott, female    DOB: 05-16-1946, 69 y.o.   MRN: [id number]  Chief Complaint  Patient presents with  . Cough    sneezing, runny nose, congestion, cough    HPI:  Alexandra Scott is a 69 y.o. female who  has a past medical history of HTN (hypertension); Colon polyps; Breast cyst (2008); Constipation; LBP (low back pain); Osteoarthritis; Osteoarthritis of hip (2011); and GERD (gastroesophageal reflux disease). and presents today for an acute office visit.  This is a new problem. Associated symptoms of sneezing, runny nose, congestion, and cough have been going on for approximately 3 weeks. Started with symptoms of allergic rhinitis that progressed into head congestion which seems to improved slightly and then has returned.. Denies any modifying factors or treatments that make it better or worse. Denies fevers. Denies recent antibiotic use.    Allergies  Allergen Reactions  . Cortisone Anxiety    Current Outpatient Prescriptions on File Prior to Visit  Medication Sig Dispense Refill  . aspirin 81 MG EC tablet Take 81 mg by mouth daily.      . benazepril (LOTENSIN) 40 MG tablet TAKE 1 TABLET BY MOUTH DAILY 30 tablet 11  . Cholecalciferol 1000 UNITS capsule Take 1,000 Units by mouth daily.    . Magnesium 250 MG TABS Take by mouth daily.    . Omega-3 Fatty Acids (FISH OIL) 1000 MG CAPS Take by mouth daily.    Marland Kitchen triamterene-hydrochlorothiazide (MAXZIDE-25) 37.5-25 MG tablet TAKE 1 TABLET BY MOUTH DAILY 30 tablet 11  . vitamin C (ASCORBIC ACID) 500 MG tablet Take 500 mg by mouth daily.     No current facility-administered medications on file prior to visit.    Review of Systems  Constitutional: Negative for fever and chills.  HENT: Positive for congestion and ear pain. Negative for sinus pressure and sore throat.   Respiratory: Positive for cough. Negative for chest tightness and shortness of breath.   Neurological: Negative for headaches.      Objective:    BP 128/84 mmHg  Pulse 92  Temp(Src) 98.4 F (36.9 C) (Oral)  Resp 18  Ht 5' (1.524 m)  Wt 164 lb (74.39 kg)  BMI 32.03 kg/m2  SpO2 99% Nursing note and vital signs reviewed.  Physical Exam  Constitutional: She is oriented to person, place, and time. She appears well-developed and well-nourished. No distress.  HENT:  Right Ear: Hearing, tympanic membrane, external ear and ear canal normal.  Left Ear: Hearing, tympanic membrane, external ear and ear canal normal.  Nose: Nose normal. Right sinus exhibits no maxillary sinus tenderness and no frontal sinus tenderness. Left sinus exhibits no maxillary sinus tenderness and no frontal sinus tenderness.  Mouth/Throat: Uvula is midline, oropharynx is clear and moist and mucous membranes are normal.  Cardiovascular: Normal rate, regular rhythm, normal heart sounds and intact distal pulses.   Pulmonary/Chest: Effort normal and breath sounds normal.  Neurological: She is alert and oriented to person, place, and time.  Skin: Skin is warm and dry.  Psychiatric: She has a normal mood and affect. Her behavior is normal. Judgment and thought content normal.       Assessment & Plan:   Problem List Items Addressed This Visit      Respiratory   Acute upper respiratory infection - Primary    Symptoms and exam consistent with acute upper respiratory infection. Start amoxicillin. Continue over-the-counter medications as needed for symptom relief and supportive care. Follow-up  if symptoms worsen or fail to improve.      Relevant Medications   amoxicillin (AMOXIL) 875 MG tablet

## 2015-09-25 NOTE — Progress Notes (Signed)
Pre visit review using our clinic review tool, if applicable. No additional management support is needed unless otherwise documented below in the visit note. 

## 2015-10-07 ENCOUNTER — Telehealth: Payer: Self-pay | Admitting: *Deleted

## 2015-10-07 MED ORDER — AZITHROMYCIN 250 MG PO TABS: ORAL_TABLET | ORAL | Status: DC

## 2015-10-07 NOTE — Telephone Encounter (Signed)
New prescription sent

## 2015-10-07 NOTE — Telephone Encounter (Signed)
Pt left msg on triage stating saw Marya Amsler couple weeks ago for hacking cough. He had rx amoxillan which completed on Tues 10/06/15, cough has return her glands are very sore. Denies fever wanting to know does she need to take another round of amoxillan or can Marya Amsler rx something else...Johny Chess

## 2015-10-08 MED ORDER — AZITHROMYCIN 250 MG PO TABS: ORAL_TABLET | ORAL | Status: DC

## 2015-10-08 NOTE — Telephone Encounter (Signed)
Notified pt Alexandra Scott sent another antibiotic to pharmacy. Pt stated since the new year they no longer uses [facility] they have to use [facility]. on [address]. Resent to pharmacy....Alexandra Scott

## 2015-10-23 ENCOUNTER — Telehealth: Payer: Self-pay | Admitting: Internal Medicine

## 2015-10-23 NOTE — Telephone Encounter (Signed)
Patient would like to know if she could turn the 4 month fu into a 6 month fu.  She states she is doing fine.

## 2015-10-24 NOTE — Telephone Encounter (Signed)
Ok Thx 

## 2015-10-26 NOTE — Telephone Encounter (Signed)
Got scheduled  °

## 2015-10-29 ENCOUNTER — Encounter: Payer: Self-pay | Admitting: Gastroenterology

## 2015-11-03 ENCOUNTER — Ambulatory Visit: Payer: PRIVATE HEALTH INSURANCE | Admitting: Internal Medicine

## 2016-01-11 ENCOUNTER — Other Ambulatory Visit (INDEPENDENT_AMBULATORY_CARE_PROVIDER_SITE_OTHER): Payer: Medicare Other

## 2016-01-11 ENCOUNTER — Ambulatory Visit (INDEPENDENT_AMBULATORY_CARE_PROVIDER_SITE_OTHER): Payer: Medicare Other | Admitting: Internal Medicine

## 2016-01-11 ENCOUNTER — Encounter: Payer: Self-pay | Admitting: Internal Medicine

## 2016-01-11 ENCOUNTER — Ambulatory Visit (INDEPENDENT_AMBULATORY_CARE_PROVIDER_SITE_OTHER)
Admission: RE | Admit: 2016-01-11 | Discharge: 2016-01-11 | Disposition: A | Discharge status: Home / Self Care | Payer: Medicare Other | Source: Ambulatory Visit | Attending: Internal Medicine | Admitting: Internal Medicine

## 2016-01-11 VITALS — BP 122/68 | HR 87 | Ht 60.0 in | Wt 158.0 lb

## 2016-01-11 DIAGNOSIS — Z0000 Encounter for general adult medical examination without abnormal findings: Secondary | ICD-10-CM

## 2016-01-11 DIAGNOSIS — I10 Essential (primary) hypertension: Secondary | ICD-10-CM

## 2016-01-11 DIAGNOSIS — E785 Hyperlipidemia, unspecified: Secondary | ICD-10-CM

## 2016-01-11 DIAGNOSIS — M5440 Lumbago with sciatica, unspecified side: Secondary | ICD-10-CM | POA: Diagnosis not present

## 2016-01-11 DIAGNOSIS — Z23 Encounter for immunization: Secondary | ICD-10-CM | POA: Diagnosis not present

## 2016-01-11 DIAGNOSIS — N3090 Cystitis, unspecified without hematuria: Secondary | ICD-10-CM

## 2016-01-11 DIAGNOSIS — R05 Cough: Secondary | ICD-10-CM

## 2016-01-11 DIAGNOSIS — R059 Cough, unspecified: Secondary | ICD-10-CM | POA: Insufficient documentation

## 2016-01-11 LAB — CBC WITH DIFFERENTIAL/PLATELET
BASOS PCT: 0.5 % (ref 0.0–3.0)
Basophils Absolute: 0 10*3/uL (ref 0.0–0.1)
EOS ABS: 0.2 10*3/uL (ref 0.0–0.7)
EOS PCT: 3.3 % (ref 0.0–5.0)
HCT: 34.9 % — ABNORMAL LOW (ref 36.0–46.0)
HEMOGLOBIN: 11.9 g/dL — AB (ref 12.0–15.0)
LYMPHS ABS: 1.6 10*3/uL (ref 0.7–4.0)
Lymphocytes Relative: 22.4 % (ref 12.0–46.0)
MCHC: 34.2 g/dL (ref 30.0–36.0)
MCV: 88.7 fl (ref 78.0–100.0)
Monocytes Absolute: 0.9 10*3/uL (ref 0.1–1.0)
Monocytes Relative: 12.2 % — ABNORMAL HIGH (ref 3.0–12.0)
NEUTROS ABS: 4.3 10*3/uL (ref 1.4–7.7)
NEUTROS PCT: 61.6 % (ref 43.0–77.0)
PLATELETS: 263 10*3/uL (ref 150.0–400.0)
RBC: 3.93 Mil/uL (ref 3.87–5.11)
RDW: 13 % (ref 11.5–15.5)
WBC: 7 10*3/uL (ref 4.0–10.5)

## 2016-01-11 LAB — URINALYSIS, ROUTINE W REFLEX MICROSCOPIC
BILIRUBIN URINE: NEGATIVE
Hgb urine dipstick: NEGATIVE
Nitrite: NEGATIVE
Specific Gravity, Urine: 1.015 (ref 1.000–1.030)
TOTAL PROTEIN, URINE-UPE24: NEGATIVE
URINE GLUCOSE: NEGATIVE
UROBILINOGEN UA: 0.2 (ref 0.0–1.0)
pH: 6.5 (ref 5.0–8.0)

## 2016-01-11 LAB — HEPATIC FUNCTION PANEL
ALT: 24 U/L (ref 0–35)
AST: 29 U/L (ref 0–37)
Albumin: 4.3 g/dL (ref 3.5–5.2)
Alkaline Phosphatase: 57 U/L (ref 39–117)
BILIRUBIN DIRECT: 0.2 mg/dL (ref 0.0–0.3)
BILIRUBIN TOTAL: 0.9 mg/dL (ref 0.2–1.2)
Total Protein: 7.5 g/dL (ref 6.0–8.3)

## 2016-01-11 LAB — LIPID PANEL
CHOL/HDL RATIO: 4
Cholesterol: 165 mg/dL (ref 0–200)
HDL: 42 mg/dL (ref >39.00)
LDL CALC: 104 mg/dL — AB (ref 0–99)
NONHDL: 122.52
TRIGLYCERIDES: 91 mg/dL (ref 0.0–149.0)
VLDL: 18.2 mg/dL (ref 0.0–40.0)

## 2016-01-11 LAB — BASIC METABOLIC PANEL
BUN: 19 mg/dL (ref 6–23)
CO2: 26 mEq/L (ref 19–32)
Calcium: 9.4 mg/dL (ref 8.4–10.5)
Chloride: 101 mEq/L (ref 96–112)
Creatinine, Ser: 0.98 mg/dL (ref 0.40–1.20)
GFR: 59.74 mL/min — AB (ref >60.00)
Glucose, Bld: 92 mg/dL (ref 70–99)
POTASSIUM: 3.9 meq/L (ref 3.5–5.1)
SODIUM: 137 meq/L (ref 135–145)

## 2016-01-11 LAB — TSH: TSH: 2.07 u[IU]/mL (ref 0.35–4.50)

## 2016-01-11 NOTE — Progress Notes (Signed)
Pre visit review using our clinic review tool, if applicable. No additional management support is needed unless otherwise documented below in the visit note. 

## 2016-01-11 NOTE — Assessment & Plan Note (Signed)
Here for medicare wellness/physical  Diet: heart healthy  Physical activity: not sedentary  Depression/mood screen: negative  Hearing: intact to whispered voice  Visual acuity: grossly normal, performs annual eye exam  ADLs: capable  Fall risk: low to none  Home safety: good  Cognitive evaluation: intact to orientation, naming, recall and repetition  EOL planning: adv directives, full code/ I agree  I have personally reviewed and have noted  1. The patient's medical, surgical and social history  2. Their use of alcohol, tobacco or illicit drugs  3. Their current medications and supplements  4. The patient's functional ability including ADL's, fall risks, home safety risks and hearing or visual impairment.  5. Diet and physical activities  6. Evidence for depression or mood disorders 7. The roster of all physicians providing medical care to patient - is listed in the Snapshot section of the chart and reviewed today.    Today patient counseled on age appropriate routine health concerns for screening and prevention, each reviewed and up to date or declined. Immunizations reviewed and up to date or declined. Labs ordered and reviewed. Risk factors for depression reviewed and negative. Hearing function and visual acuity are intact. ADLs screened and addressed as needed. Functional ability and level of safety reviewed and appropriate. Education, counseling and referrals performed based on assessed risks today. Patient provided with a copy of personalized plan for preventive services.   Zostavax suggested Colon 2011

## 2016-01-11 NOTE — Progress Notes (Signed)
Subjective:  Patient ID: Alexandra Scott, female    DOB: 09-Aug-1946  Age: 70 y.o. MRN: [id number]  CC: No chief complaint on file.   HPI Alexandra Scott presents for a well exam Last mammo 11/16 at [facility] C/o cough - chronic  Outpatient Prescriptions Prior to Visit  Medication Sig Dispense Refill  . aspirin 81 MG EC tablet Take 81 mg by mouth daily.      . benazepril (LOTENSIN) 40 MG tablet TAKE 1 TABLET BY MOUTH DAILY 30 tablet 11  . Cholecalciferol 1000 UNITS capsule Take 1,000 Units by mouth daily.    . Magnesium 250 MG TABS Take by mouth daily.    . Omega-3 Fatty Acids (FISH OIL) 1000 MG CAPS Take by mouth daily. Reported on 01/11/2016    . triamterene-hydrochlorothiazide (MAXZIDE-25) 37.5-25 MG tablet TAKE 1 TABLET BY MOUTH DAILY 30 tablet 11  . vitamin C (ASCORBIC ACID) 500 MG tablet Take 500 mg by mouth daily.    . Multiple Minerals-Vitamins (CALCIUM & VIT D3 BONE HEALTH PO) Take by mouth. Reported on 01/11/2016    . amoxicillin (AMOXIL) 875 MG tablet Take 1 tablet (875 mg total) by mouth 2 (two) times daily. (Patient not taking: Reported on 01/11/2016) 14 tablet 0  . azithromycin (ZITHROMAX) 250 MG tablet Take 2 tablets by mouth for 1 day and then 1 tablet by mouth daily for 4 days. (Patient not taking: Reported on 01/11/2016) 6 tablet 0   No facility-administered medications prior to visit.    ROS Review of Systems  Constitutional: Negative for chills, activity change, appetite change, fatigue and unexpected weight change.  HENT: Negative for congestion, mouth sores and sinus pressure.   Eyes: Negative for visual disturbance.  Respiratory: Positive for cough. Negative for chest tightness.   Gastrointestinal: Negative for nausea and abdominal pain.  Genitourinary: Negative for frequency, difficulty urinating and vaginal pain.  Musculoskeletal: Negative for back pain and gait problem.  Skin: Negative for pallor and rash.  Neurological: Negative for dizziness, tremors,  weakness, numbness and headaches.  Psychiatric/Behavioral: Negative for confusion and sleep disturbance.    Objective:  BP 122/68 mmHg  Pulse 87  Ht 5' (1.524 m)  Wt 158 lb (71.668 kg)  BMI 30.86 kg/m2  SpO2 98%  BP Readings from Last 3 Encounters:  01/11/16 122/68  09/25/15 128/84  07/08/15 124/72    Wt Readings from Last 3 Encounters:  01/11/16 158 lb (71.668 kg)  09/25/15 164 lb (74.39 kg)  07/08/15 163 lb (73.936 kg)    Physical Exam  Constitutional: She appears well-developed. No distress.  HENT:  Head: Normocephalic.  Right Ear: External ear normal.  Left Ear: External ear normal.  Nose: Nose normal.  Mouth/Throat: Oropharynx is clear and moist.  Eyes: Conjunctivae are normal. Pupils are equal, round, and reactive to light. Right eye exhibits no discharge. Left eye exhibits no discharge.  Neck: Normal range of motion. Neck supple. No JVD present. No tracheal deviation present. No thyromegaly present.  Cardiovascular: Normal rate, regular rhythm and normal heart sounds.   Pulmonary/Chest: No stridor. No respiratory distress. She has no wheezes. She exhibits no tenderness.  Abdominal: Soft. Bowel sounds are normal. She exhibits no distension and no mass. There is no tenderness. There is no rebound and no guarding.  Musculoskeletal: She exhibits no edema or tenderness.  Lymphadenopathy:    She has no cervical adenopathy.  Neurological: She displays normal reflexes. No cranial nerve deficit. She exhibits normal muscle tone. Coordination normal.  Skin:  No rash noted. No erythema.  Psychiatric: She has a normal mood and affect. Her behavior is normal. Judgment and thought content normal.  Obese Breast exam WNL B: no mass - B mild fibrocystic changes, no LNs  Lab Results  Component Value Date   WBC 7.4 12/30/2014   HGB 12.9 12/30/2014   HCT 37.6 12/30/2014   PLT 241.0 12/30/2014   GLUCOSE 98 06/30/2015   CHOL 167 12/30/2014   TRIG 130.0 12/30/2014   HDL 38.70*  12/30/2014   LDLDIRECT 150.6 10/15/2007   LDLCALC 102* 12/30/2014   ALT 32 12/30/2014   AST 30 12/30/2014   NA 135 06/30/2015   K 4.1 06/30/2015   CL 98 06/30/2015   CREATININE 0.93 06/30/2015   BUN 21 06/30/2015   CO2 26 06/30/2015   TSH 3.92 12/30/2014   INR 0.97 03/29/2011    Dg Chest 2 View  05/21/2014  CLINICAL DATA:  Preop left nipple duct excision EXAM: CHEST  2 VIEW COMPARISON:  03/25/2011 FINDINGS: The heart size and mediastinal contours are within normal limits. Both lungs are clear. The visualized skeletal structures are unremarkable. IMPRESSION: No active cardiopulmonary disease. Electronically Signed   By: Franchot Gallo M.D.   On: 05/21/2014 16:32    Assessment & Plan:   There are no diagnoses linked to this encounter. I have discontinued Ms. Yeates's amoxicillin and azithromycin. I am also having her maintain her aspirin, vitamin C, Cholecalciferol, Fish Oil, Magnesium, triamterene-hydrochlorothiazide, benazepril, and Multiple Minerals-Vitamins (CALCIUM & VIT D3 BONE HEALTH PO).  No orders of the defined types were placed in this encounter.     Follow-up: No Follow-up on file.  Walker Kehr, MD

## 2016-01-11 NOTE — Assessment & Plan Note (Addendum)
labs

## 2016-01-11 NOTE — Assessment & Plan Note (Signed)
4/17 ?benazepril related - pt is not willing to switch CXR Claritin, Flonase

## 2016-01-11 NOTE — Assessment & Plan Note (Signed)
On Benazepril, Maxzide labs

## 2016-01-11 NOTE — Patient Instructions (Addendum)
Claritin, Flonase   Preventive Care for Adults, Female A healthy lifestyle and preventive care can promote health and wellness. Preventive health guidelines for women include the following key practices.  A routine yearly physical is a good way to check with your health care provider about your health and preventive screening. It is a chance to share any concerns and updates on your health and to receive a thorough exam.  Visit your dentist for a routine exam and preventive care every 6 months. Brush your teeth twice a day and floss once a day. Good oral hygiene prevents tooth decay and gum disease.  The frequency of eye exams is based on your age, health, family medical history, use of contact lenses, and other factors. Follow your health care provider's recommendations for frequency of eye exams.  Eat a healthy diet. Foods like vegetables, fruits, whole grains, low-fat dairy products, and lean protein foods contain the nutrients you need without too many calories. Decrease your intake of foods high in solid fats, added sugars, and salt. Eat the right amount of calories for you.Get information about a proper diet from your health care provider, if necessary.  Regular physical exercise is one of the most important things you can do for your health. Most adults should get at least 150 minutes of moderate-intensity exercise (any activity that increases your heart rate and causes you to sweat) each week. In addition, most adults need muscle-strengthening exercises on 2 or more days a week.  Maintain a healthy weight. The body mass index (BMI) is a screening tool to identify possible weight problems. It provides an estimate of body fat based on height and weight. Your health care provider can find your BMI and can help you achieve or maintain a healthy weight.For adults 20 years and older:  A BMI below 18.5 is considered underweight.  A BMI of 18.5 to 24.9 is normal.  A BMI of 25 to 29.9 is  considered overweight.  A BMI of 30 and above is considered obese.  Maintain normal blood lipids and cholesterol levels by exercising and minimizing your intake of saturated fat. Eat a balanced diet with plenty of fruit and vegetables. Blood tests for lipids and cholesterol should begin at age 3 and be repeated every 5 years. If your lipid or cholesterol levels are high, you are over 50, or you are at high risk for heart disease, you may need your cholesterol levels checked more frequently.Ongoing high lipid and cholesterol levels should be treated with medicines if diet and exercise are not working.  If you smoke, find out from your health care provider how to quit. If you do not use tobacco, do not start.  Lung cancer screening is recommended for adults aged 55-80 years who are at high risk for developing lung cancer because of a history of smoking. A yearly low-dose CT scan of the lungs is recommended for people who have at least a 30-pack-year history of smoking and are a current smoker or have quit within the past 15 years. A pack year of smoking is smoking an average of 1 pack of cigarettes a day for 1 year (for example: 1 pack a day for 30 years or 2 packs a day for 15 years). Yearly screening should continue until the smoker has stopped smoking for at least 15 years. Yearly screening should be stopped for people who develop a health problem that would prevent them from having lung cancer treatment.  If you are pregnant, do not drink  alcohol. If you are breastfeeding, be very cautious about drinking alcohol. If you are not pregnant and choose to drink alcohol, do not have more than 1 drink per day. One drink is considered to be 12 ounces (355 mL) of beer, 5 ounces (148 mL) of wine, or 1.5 ounces (44 mL) of liquor.  Avoid use of street drugs. Do not share needles with anyone. Ask for help if you need support or instructions about stopping the use of drugs.  High blood pressure causes heart  disease and increases the risk of stroke. Your blood pressure should be checked at least every 1 to 2 years. Ongoing high blood pressure should be treated with medicines if weight loss and exercise do not work.  If you are 75-48 years old, ask your health care provider if you should take aspirin to prevent strokes.  Diabetes screening is done by taking a blood sample to check your blood glucose level after you have not eaten for a certain period of time (fasting). If you are not overweight and you do not have risk factors for diabetes, you should be screened once every 3 years starting at age 21. If you are overweight or obese and you are 67-59 years of age, you should be screened for diabetes every year as part of your cardiovascular risk assessment.  Breast cancer screening is essential preventive care for women. You should practice "breast self-awareness." This means understanding the normal appearance and feel of your breasts and may include breast self-examination. Any changes detected, no matter how small, should be reported to a health care provider. Women in their 71s and 30s should have a clinical breast exam (CBE) by a health care provider as part of a regular health exam every 1 to 3 years. After age 52, women should have a CBE every year. Starting at age 97, women should consider having a mammogram (breast X-ray test) every year. Women who have a family history of breast cancer should talk to their health care provider about genetic screening. Women at a high risk of breast cancer should talk to their health care providers about having an MRI and a mammogram every year.  Breast cancer gene (BRCA)-related cancer risk assessment is recommended for women who have family members with BRCA-related cancers. BRCA-related cancers include breast, ovarian, tubal, and peritoneal cancers. Having family members with these cancers may be associated with an increased risk for harmful changes (mutations) in the  breast cancer genes BRCA1 and BRCA2. Results of the assessment will determine the need for genetic counseling and BRCA1 and BRCA2 testing.  Your health care provider may recommend that you be screened regularly for cancer of the pelvic organs (ovaries, uterus, and vagina). This screening involves a pelvic examination, including checking for microscopic changes to the surface of your cervix (Pap test). You may be encouraged to have this screening done every 3 years, beginning at age 45.  For women ages 62-65, health care providers may recommend pelvic exams and Pap testing every 3 years, or they may recommend the Pap and pelvic exam, combined with testing for human papilloma virus (HPV), every 5 years. Some types of HPV increase your risk of cervical cancer. Testing for HPV may also be done on women of any age with unclear Pap test results.  Other health care providers may not recommend any screening for nonpregnant women who are considered low risk for pelvic cancer and who do not have symptoms. Ask your health care provider if a screening pelvic  exam is right for you.  If you have had past treatment for cervical cancer or a condition that could lead to cancer, you need Pap tests and screening for cancer for at least 20 years after your treatment. If Pap tests have been discontinued, your risk factors (such as having a new sexual partner) need to be reassessed to determine if screening should resume. Some women have medical problems that increase the chance of getting cervical cancer. In these cases, your health care provider may recommend more frequent screening and Pap tests.  Colorectal cancer can be detected and often prevented. Most routine colorectal cancer screening begins at the age of 27 years and continues through age 54 years. However, your health care provider may recommend screening at an earlier age if you have risk factors for colon cancer. On a yearly basis, your health care provider may  provide home test kits to check for hidden blood in the stool. Use of a small camera at the end of a tube, to directly examine the colon (sigmoidoscopy or colonoscopy), can detect the earliest forms of colorectal cancer. Talk to your health care provider about this at age 65, when routine screening begins. Direct exam of the colon should be repeated every 5-10 years through age 25 years, unless early forms of precancerous polyps or small growths are found.  People who are at an increased risk for hepatitis B should be screened for this virus. You are considered at high risk for hepatitis B if:  You were born in a country where hepatitis B occurs often. Talk with your health care provider about which countries are considered high risk.  Your parents were born in a high-risk country and you have not received a shot to protect against hepatitis B (hepatitis B vaccine).  You have HIV or AIDS.  You use needles to inject street drugs.  You live with, or have sex with, someone who has hepatitis B.  You get hemodialysis treatment.  You take certain medicines for conditions like cancer, organ transplantation, and autoimmune conditions.  Hepatitis C blood testing is recommended for all people born from [birth year] through 1965 and any individual with known risks for hepatitis C.  Practice safe sex. Use condoms and avoid high-risk sexual practices to reduce the spread of sexually transmitted infections (STIs). STIs include gonorrhea, chlamydia, syphilis, trichomonas, herpes, HPV, and human immunodeficiency virus (HIV). Herpes, HIV, and HPV are viral illnesses that have no cure. They can result in disability, cancer, and death.  You should be screened for sexually transmitted illnesses (STIs) including gonorrhea and chlamydia if:  You are sexually active and are younger than 24 years.  You are older than 24 years and your health care provider tells you that you are at risk for this type of infection.  Your  sexual activity has changed since you were last screened and you are at an increased risk for chlamydia or gonorrhea. Ask your health care provider if you are at risk.  If you are at risk of being infected with HIV, it is recommended that you take a prescription medicine daily to prevent HIV infection. This is called preexposure prophylaxis (PrEP). You are considered at risk if:  You are sexually active and do not regularly use condoms or know the HIV status of your partner(s).  You take drugs by injection.  You are sexually active with a partner who has HIV.  Talk with your health care provider about whether you are at high risk of being  infected with HIV. If you choose to begin PrEP, you should first be tested for HIV. You should then be tested every 3 months for as long as you are taking PrEP.  Osteoporosis is a disease in which the bones lose minerals and strength with aging. This can result in serious bone fractures or breaks. The risk of osteoporosis can be identified using a bone density scan. Women ages 49 years and over and women at risk for fractures or osteoporosis should discuss screening with their health care providers. Ask your health care provider whether you should take a calcium supplement or vitamin D to reduce the rate of osteoporosis.  Menopause can be associated with physical symptoms and risks. Hormone replacement therapy is available to decrease symptoms and risks. You should talk to your health care provider about whether hormone replacement therapy is right for you.  Use sunscreen. Apply sunscreen liberally and repeatedly throughout the day. You should seek shade when your shadow is shorter than you. Protect yourself by wearing long sleeves, pants, a wide-brimmed hat, and sunglasses year round, whenever you are outdoors.  Once a month, do a whole body skin exam, using a mirror to look at the skin on your back. Tell your health care provider of new moles, moles that have  irregular borders, moles that are larger than a pencil eraser, or moles that have changed in shape or color.  Stay current with required vaccines (immunizations).  Influenza vaccine. All adults should be immunized every year.  Tetanus, diphtheria, and acellular pertussis (Td, Tdap) vaccine. Pregnant women should receive 1 dose of Tdap vaccine during each pregnancy. The dose should be obtained regardless of the length of time since the last dose. Immunization is preferred during the 27th-36th week of gestation. An adult who has not previously received Tdap or who does not know her vaccine status should receive 1 dose of Tdap. This initial dose should be followed by tetanus and diphtheria toxoids (Td) booster doses every 10 years. Adults with an unknown or incomplete history of completing a 3-dose immunization series with Td-containing vaccines should begin or complete a primary immunization series including a Tdap dose. Adults should receive a Td booster every 10 years.  Varicella vaccine. An adult without evidence of immunity to varicella should receive 2 doses or a second dose if she has previously received 1 dose. Pregnant females who do not have evidence of immunity should receive the first dose after pregnancy. This first dose should be obtained before leaving the health care facility. The second dose should be obtained 4-8 weeks after the first dose.  Human papillomavirus (HPV) vaccine. Females aged 13-26 years who have not received the vaccine previously should obtain the 3-dose series. The vaccine is not recommended for use in pregnant females. However, pregnancy testing is not needed before receiving a dose. If a female is found to be pregnant after receiving a dose, no treatment is needed. In that case, the remaining doses should be delayed until after the pregnancy. Immunization is recommended for any person with an immunocompromised condition through the age of 28 years if she did not get any or  all doses earlier. During the 3-dose series, the second dose should be obtained 4-8 weeks after the first dose. The third dose should be obtained 24 weeks after the first dose and 16 weeks after the second dose.  Zoster vaccine. One dose is recommended for adults aged 68 years or older unless certain conditions are present.  Measles, mumps, and rubella (  MMR) vaccine. Adults born before [birth year] generally are considered immune to measles and mumps. Adults born in [birth year] or later should have 1 or more doses of MMR vaccine unless there is a contraindication to the vaccine or there is laboratory evidence of immunity to each of the three diseases. A routine second dose of MMR vaccine should be obtained at least 28 days after the first dose for students attending postsecondary schools, health care workers, or international travelers. People who received inactivated measles vaccine or an unknown type of measles vaccine during 1963-1967 should receive 2 doses of MMR vaccine. People who received inactivated mumps vaccine or an unknown type of mumps vaccine before 1979 and are at high risk for mumps infection should consider immunization with 2 doses of MMR vaccine. For females of childbearing age, rubella immunity should be determined. If there is no evidence of immunity, females who are not pregnant should be vaccinated. If there is no evidence of immunity, females who are pregnant should delay immunization until after pregnancy. Unvaccinated health care workers born before [birth year] who lack laboratory evidence of measles, mumps, or rubella immunity or laboratory confirmation of disease should consider measles and mumps immunization with 2 doses of MMR vaccine or rubella immunization with 1 dose of MMR vaccine.  Pneumococcal 13-valent conjugate (PCV13) vaccine. When indicated, a person who is uncertain of his immunization history and has no record of immunization should receive the PCV13 vaccine. All adults 81 years of age and  older should receive this vaccine. An adult aged 66 years or older who has certain medical conditions and has not been previously immunized should receive 1 dose of PCV13 vaccine. This PCV13 should be followed with a dose of pneumococcal polysaccharide (PPSV23) vaccine. Adults who are at high risk for pneumococcal disease should obtain the PPSV23 vaccine at least 8 weeks after the dose of PCV13 vaccine. Adults older than 70 years of age who have normal immune system function should obtain the PPSV23 vaccine dose at least 1 year after the dose of PCV13 vaccine.  Pneumococcal polysaccharide (PPSV23) vaccine. When PCV13 is also indicated, PCV13 should be obtained first. All adults aged 46 years and older should be immunized. An adult younger than age 42 years who has certain medical conditions should be immunized. Any person who resides in a nursing home or long-term care facility should be immunized. An adult smoker should be immunized. People with an immunocompromised condition and certain other conditions should receive both PCV13 and PPSV23 vaccines. People with human immunodeficiency virus (HIV) infection should be immunized as soon as possible after diagnosis. Immunization during chemotherapy or radiation therapy should be avoided. Routine use of PPSV23 vaccine is not recommended for American Indians, [place] Natives, or people younger than 65 years unless there are medical conditions that require PPSV23 vaccine. When indicated, people who have unknown immunization and have no record of immunization should receive PPSV23 vaccine. One-time revaccination 5 years after the first dose of PPSV23 is recommended for people aged 19-64 years who have chronic kidney failure, nephrotic syndrome, asplenia, or immunocompromised conditions. People who received 1-2 doses of PPSV23 before age 30 years should receive another dose of PPSV23 vaccine at age 26 years or later if at least 5 years have passed since the previous dose.  Doses of PPSV23 are not needed for people immunized with PPSV23 at or after age 74 years.  Meningococcal vaccine. Adults with asplenia or persistent complement component deficiencies should receive 2 doses of quadrivalent meningococcal conjugate (MenACWY-D) vaccine. The  doses should be obtained at least 2 months apart. Microbiologists working with certain meningococcal bacteria, [place] recruits, people at risk during an outbreak, and people who travel to or live in countries with a high rate of meningitis should be immunized. A first-year college student up through age 42 years who is living in a residence hall should receive a dose if she did not receive a dose on or after her 16th birthday. Adults who have certain high-risk conditions should receive one or more doses of vaccine.  Hepatitis A vaccine. Adults who wish to be protected from this disease, have certain high-risk conditions, work with hepatitis A-infected animals, work in hepatitis A research labs, or travel to or work in countries with a high rate of hepatitis A should be immunized. Adults who were previously unvaccinated and who anticipate close contact with an international adoptee during the first 60 days after arrival in the [place] from a country with a high rate of hepatitis A should be immunized.  Hepatitis B vaccine. Adults who wish to be protected from this disease, have certain high-risk conditions, may be exposed to blood or other infectious body fluids, are household contacts or sex partners of hepatitis B positive people, are clients or workers in certain care facilities, or travel to or work in countries with a high rate of hepatitis B should be immunized.  Haemophilus influenzae type b (Hib) vaccine. A previously unvaccinated person with asplenia or sickle cell disease or having a scheduled splenectomy should receive 1 dose of Hib vaccine. Regardless of previous immunization, a recipient of a hematopoietic stem cell  transplant should receive a 3-dose series 6-12 months after her successful transplant. Hib vaccine is not recommended for adults with HIV infection. Preventive Services / Frequency Ages 41 to 10 years  Blood pressure check.** / Every 3-5 years.  Lipid and cholesterol check.** / Every 5 years beginning at age 70.  Clinical breast exam.** / Every 3 years for women in their 58s and 30s.  BRCA-related cancer risk assessment.** / For women who have family members with a BRCA-related cancer (breast, ovarian, tubal, or peritoneal cancers).  Pap test.** / Every 2 years from ages 72 through 35. Every 3 years starting at age 80 through age 31 or 42 with a history of 3 consecutive normal Pap tests.  HPV screening.** / Every 3 years from ages 76 through ages 58 to 66 with a history of 3 consecutive normal Pap tests.  Hepatitis C blood test.** / For any individual with known risks for hepatitis C.  Skin self-exam. / Monthly.  Influenza vaccine. / Every year.  Tetanus, diphtheria, and acellular pertussis (Tdap, Td) vaccine.** / Consult your health care provider. Pregnant women should receive 1 dose of Tdap vaccine during each pregnancy. 1 dose of Td every 10 years.  Varicella vaccine.** / Consult your health care provider. Pregnant females who do not have evidence of immunity should receive the first dose after pregnancy.  HPV vaccine. / 3 doses over 6 months, if 64 and younger. The vaccine is not recommended for use in pregnant females. However, pregnancy testing is not needed before receiving a dose.  Measles, mumps, rubella (MMR) vaccine.** / You need at least 1 dose of MMR if you were born in 1957 or later. You may also need a 2nd dose. For females of childbearing age, rubella immunity should be determined. If there is no evidence of immunity, females who are not pregnant should be vaccinated. If there is no evidence of  immunity, females who are pregnant should delay immunization until after  pregnancy.  Pneumococcal 13-valent conjugate (PCV13) vaccine.** / Consult your health care provider.  Pneumococcal polysaccharide (PPSV23) vaccine.** / 1 to 2 doses if you smoke cigarettes or if you have certain conditions.  Meningococcal vaccine.** / 1 dose if you are age 72 to 9 years and a Market researcher living in a residence hall, or have one of several medical conditions, you need to get vaccinated against meningococcal disease. You may also need additional booster doses.  Hepatitis A vaccine.** / Consult your health care provider.  Hepatitis B vaccine.** / Consult your health care provider.  Haemophilus influenzae type b (Hib) vaccine.** / Consult your health care provider. Ages 76 to 47 years  Blood pressure check.** / Every year.  Lipid and cholesterol check.** / Every 5 years beginning at age 2 years.  Lung cancer screening. / Every year if you are aged 53-80 years and have a 30-pack-year history of smoking and currently smoke or have quit within the past 15 years. Yearly screening is stopped once you have quit smoking for at least 15 years or develop a health problem that would prevent you from having lung cancer treatment.  Clinical breast exam.** / Every year after age 32 years.  BRCA-related cancer risk assessment.** / For women who have family members with a BRCA-related cancer (breast, ovarian, tubal, or peritoneal cancers).  Mammogram.** / Every year beginning at age 44 years and continuing for as long as you are in good health. Consult with your health care provider.  Pap test.** / Every 3 years starting at age 40 years through age 85 or 66 years with a history of 3 consecutive normal Pap tests.  HPV screening.** / Every 3 years from ages 36 years through ages 67 to 60 years with a history of 3 consecutive normal Pap tests.  Fecal occult blood test (FOBT) of stool. / Every year beginning at age 73 years and continuing until age 46 years. You may not need  to do this test if you get a colonoscopy every 10 years.  Flexible sigmoidoscopy or colonoscopy.** / Every 5 years for a flexible sigmoidoscopy or every 10 years for a colonoscopy beginning at age 14 years and continuing until age 64 years.  Hepatitis C blood test.** / For all people born from [birth year] through 1965 and any individual with known risks for hepatitis C.  Skin self-exam. / Monthly.  Influenza vaccine. / Every year.  Tetanus, diphtheria, and acellular pertussis (Tdap/Td) vaccine.** / Consult your health care provider. Pregnant women should receive 1 dose of Tdap vaccine during each pregnancy. 1 dose of Td every 10 years.  Varicella vaccine.** / Consult your health care provider. Pregnant females who do not have evidence of immunity should receive the first dose after pregnancy.  Zoster vaccine.** / 1 dose for adults aged 23 years or older.  Measles, mumps, rubella (MMR) vaccine.** / You need at least 1 dose of MMR if you were born in 1957 or later. You may also need a second dose. For females of childbearing age, rubella immunity should be determined. If there is no evidence of immunity, females who are not pregnant should be vaccinated. If there is no evidence of immunity, females who are pregnant should delay immunization until after pregnancy.  Pneumococcal 13-valent conjugate (PCV13) vaccine.** / Consult your health care provider.  Pneumococcal polysaccharide (PPSV23) vaccine.** / 1 to 2 doses if you smoke cigarettes or if you have certain conditions.  Meningococcal vaccine.** / Consult your health care provider.  Hepatitis A vaccine.** / Consult your health care provider.  Hepatitis B vaccine.** / Consult your health care provider.  Haemophilus influenzae type b (Hib) vaccine.** / Consult your health care provider. Ages 54 years and over  Blood pressure check.** / Every year.  Lipid and cholesterol check.** / Every 5 years beginning at age 87 years.  Lung cancer  screening. / Every year if you are aged 71-80 years and have a 30-pack-year history of smoking and currently smoke or have quit within the past 15 years. Yearly screening is stopped once you have quit smoking for at least 15 years or develop a health problem that would prevent you from having lung cancer treatment.  Clinical breast exam.** / Every year after age 68 years.  BRCA-related cancer risk assessment.** / For women who have family members with a BRCA-related cancer (breast, ovarian, tubal, or peritoneal cancers).  Mammogram.** / Every year beginning at age 93 years and continuing for as long as you are in good health. Consult with your health care provider.  Pap test.** / Every 3 years starting at age 76 years through age 45 or 58 years with 3 consecutive normal Pap tests. Testing can be stopped between 65 and 70 years with 3 consecutive normal Pap tests and no abnormal Pap or HPV tests in the past 10 years.  HPV screening.** / Every 3 years from ages 37 years through ages 35 or 59 years with a history of 3 consecutive normal Pap tests. Testing can be stopped between 65 and 70 years with 3 consecutive normal Pap tests and no abnormal Pap or HPV tests in the past 10 years.  Fecal occult blood test (FOBT) of stool. / Every year beginning at age 5 years and continuing until age 66 years. You may not need to do this test if you get a colonoscopy every 10 years.  Flexible sigmoidoscopy or colonoscopy.** / Every 5 years for a flexible sigmoidoscopy or every 10 years for a colonoscopy beginning at age 49 years and continuing until age 81 years.  Hepatitis C blood test.** / For all people born from [birth year] through 1965 and any individual with known risks for hepatitis C.  Osteoporosis screening.** / A one-time screening for women ages 28 years and over and women at risk for fractures or osteoporosis.  Skin self-exam. / Monthly.  Influenza vaccine. / Every year.  Tetanus, diphtheria, and  acellular pertussis (Tdap/Td) vaccine.** / 1 dose of Td every 10 years.  Varicella vaccine.** / Consult your health care provider.  Zoster vaccine.** / 1 dose for adults aged 36 years or older.  Pneumococcal 13-valent conjugate (PCV13) vaccine.** / Consult your health care provider.  Pneumococcal polysaccharide (PPSV23) vaccine.** / 1 dose for all adults aged 39 years and older.  Meningococcal vaccine.** / Consult your health care provider.  Hepatitis A vaccine.** / Consult your health care provider.  Hepatitis B vaccine.** / Consult your health care provider.  Haemophilus influenzae type b (Hib) vaccine.** / Consult your health care provider. ** Family history and personal history of risk and conditions may change your health care provider's recommendations.   This information is not intended to replace advice given to you by your health care provider. Make sure you discuss any questions you have with your health care provider.   Document Released: 11/08/2001 Document Revised: 10/03/2014 Document Reviewed: 02/07/2011 Elsevier Interactive Patient Education [employer].

## 2016-01-12 ENCOUNTER — Telehealth: Payer: Self-pay | Admitting: Internal Medicine

## 2016-01-12 NOTE — Telephone Encounter (Signed)
Pt called stating she received a call regarding lab results. Call pt @ [phone]. Thank you!

## 2016-01-13 DIAGNOSIS — N3090 Cystitis, unspecified without hematuria: Secondary | ICD-10-CM | POA: Insufficient documentation

## 2016-01-13 MED ORDER — CEFUROXIME AXETIL 250 MG PO TABS: 250.0000 mg | ORAL_TABLET | Freq: Two times a day (BID) | ORAL | Status: DC

## 2016-01-13 NOTE — Telephone Encounter (Signed)
Pt informed of below.    Notes Recorded by Rande Brunt Self on 01/12/2016 at 2:16 PM LVM for pt to call back as soon as possible.   ------  Notes Recorded by Cassandria Anger, MD on 01/12/2016 at 12:45 PM Erline Levine, please, inform patient that all labs are OK Thank you!      Notes Recorded by Rande Brunt Self on 01/13/2016 at 9:46 AM Informed pt of results. Notes Recorded by Cassandria Anger, MD on 01/13/2016 at 12:24 AM Erline Levine, please, inform patient that all labs are normal except for a UTI _ pls take Ceftin -- Rx emailed Thx

## 2016-01-13 NOTE — Assessment & Plan Note (Signed)
Rx ceftin

## 2016-01-15 ENCOUNTER — Telehealth: Payer: Self-pay | Admitting: Internal Medicine

## 2016-01-15 NOTE — Telephone Encounter (Signed)
Copies mailed to pt.     Notes Recorded by Rande Brunt Self on 01/13/2016 at 9:46 AM Informed pt of results. Notes Recorded by Cassandria Anger, MD on 01/13/2016 at 12:24 AM Erline Levine, please, inform patient that all labs are normal except for a UTI _ pls take Ceftin -- Rx emailed Thx

## 2016-01-15 NOTE — Telephone Encounter (Signed)
Patient is requesting call back for lab results.  She is also requesting labs to be mailed to her.

## 2016-02-05 ENCOUNTER — Telehealth: Payer: Self-pay | Admitting: *Deleted

## 2016-02-05 ENCOUNTER — Other Ambulatory Visit (INDEPENDENT_AMBULATORY_CARE_PROVIDER_SITE_OTHER): Payer: Medicare Other

## 2016-02-05 DIAGNOSIS — N3001 Acute cystitis with hematuria: Secondary | ICD-10-CM

## 2016-02-05 LAB — URINALYSIS, ROUTINE W REFLEX MICROSCOPIC
Bilirubin Urine: NEGATIVE
Hgb urine dipstick: NEGATIVE
Ketones, ur: NEGATIVE
NITRITE: NEGATIVE
PH: 7 (ref 5.0–8.0)
SPECIFIC GRAVITY, URINE: 1.01 (ref 1.000–1.030)
TOTAL PROTEIN, URINE-UPE24: NEGATIVE
URINE GLUCOSE: NEGATIVE
UROBILINOGEN UA: 1 (ref 0.0–1.0)

## 2016-02-05 NOTE — Telephone Encounter (Signed)
Received call pt states she completed the antibiotic that MD rx for her UTI. At first it seem like it was cleared up , but started back having some pressure when urinating and white d/c. Wanting to know if she need another round of antibiotic or have UA repeated. Inform pt will order UA to make sure if sxs is cleared or not before refill can be sent. Pt coming this afternoon for UA. Order has been place...Johny Chess

## 2016-02-12 ENCOUNTER — Other Ambulatory Visit (INDEPENDENT_AMBULATORY_CARE_PROVIDER_SITE_OTHER): Payer: Medicare Other

## 2016-02-12 ENCOUNTER — Telehealth: Payer: Self-pay

## 2016-02-12 DIAGNOSIS — N3001 Acute cystitis with hematuria: Secondary | ICD-10-CM

## 2016-02-12 DIAGNOSIS — N3000 Acute cystitis without hematuria: Secondary | ICD-10-CM | POA: Diagnosis not present

## 2016-02-12 LAB — URINALYSIS, ROUTINE W REFLEX MICROSCOPIC
BILIRUBIN URINE: NEGATIVE
HGB URINE DIPSTICK: NEGATIVE
KETONES UR: NEGATIVE
NITRITE: NEGATIVE
Specific Gravity, Urine: 1.01 (ref 1.000–1.030)
TOTAL PROTEIN, URINE-UPE24: NEGATIVE
Urine Glucose: NEGATIVE
Urobilinogen, UA: 0.2 (ref 0.0–1.0)
pH: 7 (ref 5.0–8.0)

## 2016-02-12 MED ORDER — CIPROFLOXACIN HCL 250 MG PO TABS: 250.0000 mg | ORAL_TABLET | Freq: Two times a day (BID) | ORAL | Status: DC

## 2016-02-12 NOTE — Telephone Encounter (Signed)
UA was unchanged from before.  Pls leave another sample for UA and Cx. I'll call in Cipro Rx - take if worse Thx

## 2016-02-12 NOTE — Telephone Encounter (Signed)
Done

## 2016-02-12 NOTE — Telephone Encounter (Signed)
See 02/05/16 UA. Please advise.

## 2016-02-12 NOTE — Telephone Encounter (Signed)
Patient called about her lab results from last Friday. I don't see anything in the computer about it. Could you please follow up.

## 2016-02-14 LAB — URINE CULTURE

## 2016-04-01 DIAGNOSIS — Z471 Aftercare following joint replacement surgery: Secondary | ICD-10-CM | POA: Diagnosis not present

## 2016-04-01 DIAGNOSIS — Z96641 Presence of right artificial hip joint: Secondary | ICD-10-CM | POA: Diagnosis not present

## 2016-05-10 DIAGNOSIS — N959 Unspecified menopausal and perimenopausal disorder: Secondary | ICD-10-CM | POA: Diagnosis not present

## 2016-05-10 DIAGNOSIS — Z6832 Body mass index (BMI) 32.0-32.9, adult: Secondary | ICD-10-CM | POA: Diagnosis not present

## 2016-07-05 ENCOUNTER — Other Ambulatory Visit: Payer: Self-pay | Admitting: Internal Medicine

## 2016-08-12 DIAGNOSIS — Z0000 Encounter for general adult medical examination without abnormal findings: Secondary | ICD-10-CM | POA: Diagnosis not present

## 2016-09-22 DIAGNOSIS — Z1231 Encounter for screening mammogram for malignant neoplasm of breast: Secondary | ICD-10-CM | POA: Diagnosis not present

## 2016-09-22 DIAGNOSIS — Z803 Family history of malignant neoplasm of breast: Secondary | ICD-10-CM | POA: Diagnosis not present

## 2017-01-02 ENCOUNTER — Telehealth: Payer: Self-pay | Admitting: Internal Medicine

## 2017-01-02 NOTE — Telephone Encounter (Signed)
Spoke with patient regarding awv. Patient stated that she had a wellness visit twice last year. One with Dr. Alain Marion and another one in Nov 2017 with [facility]. Patient stated that she is not interested in scheduling awv for this year. Patient wanted to make appt with Dr. Alain Marion because she has concerns with bloating, stools, and concerned with her nails.

## 2017-01-04 ENCOUNTER — Other Ambulatory Visit: Payer: Self-pay | Admitting: Internal Medicine

## 2017-01-10 ENCOUNTER — Ambulatory Visit (INDEPENDENT_AMBULATORY_CARE_PROVIDER_SITE_OTHER): Payer: Medicare Other | Admitting: Internal Medicine

## 2017-01-10 ENCOUNTER — Encounter: Payer: Self-pay | Admitting: Internal Medicine

## 2017-01-10 ENCOUNTER — Other Ambulatory Visit (INDEPENDENT_AMBULATORY_CARE_PROVIDER_SITE_OTHER): Payer: Medicare Other

## 2017-01-10 VITALS — BP 130/76 | HR 88 | Temp 98.5°F | Ht 60.0 in | Wt 170.0 lb

## 2017-01-10 DIAGNOSIS — E785 Hyperlipidemia, unspecified: Secondary | ICD-10-CM | POA: Diagnosis not present

## 2017-01-10 DIAGNOSIS — M810 Age-related osteoporosis without current pathological fracture: Secondary | ICD-10-CM | POA: Insufficient documentation

## 2017-01-10 DIAGNOSIS — K219 Gastro-esophageal reflux disease without esophagitis: Secondary | ICD-10-CM | POA: Diagnosis not present

## 2017-01-10 DIAGNOSIS — R109 Unspecified abdominal pain: Secondary | ICD-10-CM

## 2017-01-10 DIAGNOSIS — J3489 Other specified disorders of nose and nasal sinuses: Secondary | ICD-10-CM | POA: Insufficient documentation

## 2017-01-10 LAB — H. PYLORI ANTIBODY, IGG: H PYLORI IGG: NEGATIVE

## 2017-01-10 LAB — CBC WITH DIFFERENTIAL/PLATELET
BASOS ABS: 0.1 10*3/uL (ref 0.0–0.1)
Basophils Relative: 0.8 % (ref 0.0–3.0)
Eosinophils Absolute: 0.2 10*3/uL (ref 0.0–0.7)
Eosinophils Relative: 2.9 % (ref 0.0–5.0)
HCT: 38.6 % (ref 36.0–46.0)
HEMOGLOBIN: 12.9 g/dL (ref 12.0–15.0)
LYMPHS ABS: 1.7 10*3/uL (ref 0.7–4.0)
Lymphocytes Relative: 20.9 % (ref 12.0–46.0)
MCHC: 33.4 g/dL (ref 30.0–36.0)
MCV: 90.8 fl (ref 78.0–100.0)
MONO ABS: 0.8 10*3/uL (ref 0.1–1.0)
MONOS PCT: 10.2 % (ref 3.0–12.0)
Neutro Abs: 5.2 10*3/uL (ref 1.4–7.7)
Neutrophils Relative %: 65.2 % (ref 43.0–77.0)
Platelets: 266 10*3/uL (ref 150.0–400.0)
RBC: 4.25 Mil/uL (ref 3.87–5.11)
RDW: 12.5 % (ref 11.5–15.5)
WBC: 7.9 10*3/uL (ref 4.0–10.5)

## 2017-01-10 LAB — BASIC METABOLIC PANEL
BUN: 23 mg/dL (ref 6–23)
CALCIUM: 10.3 mg/dL (ref 8.4–10.5)
CHLORIDE: 97 meq/L (ref 96–112)
CO2: 28 mEq/L (ref 19–32)
Creatinine, Ser: 1.02 mg/dL (ref 0.40–1.20)
GFR: 56.88 mL/min — AB (ref >60.00)
Glucose, Bld: 110 mg/dL — ABNORMAL HIGH (ref 70–99)
POTASSIUM: 4.4 meq/L (ref 3.5–5.1)
Sodium: 133 mEq/L — ABNORMAL LOW (ref 135–145)

## 2017-01-10 LAB — URINALYSIS, ROUTINE W REFLEX MICROSCOPIC
BILIRUBIN URINE: NEGATIVE
Hgb urine dipstick: NEGATIVE
KETONES UR: NEGATIVE
NITRITE: NEGATIVE
PH: 7 (ref 5.0–8.0)
RBC / HPF: NONE SEEN (ref 0–?)
Specific Gravity, Urine: 1.01 (ref 1.000–1.030)
Total Protein, Urine: NEGATIVE
Urine Glucose: NEGATIVE
Urobilinogen, UA: 0.2 (ref 0.0–1.0)

## 2017-01-10 LAB — TSH: TSH: 3.18 u[IU]/mL (ref 0.35–4.50)

## 2017-01-10 LAB — LIPID PANEL
Cholesterol: 171 mg/dL (ref 0–200)
HDL: 45.5 mg/dL (ref >39.00)
LDL CALC: 107 mg/dL — AB (ref 0–99)
NonHDL: 125.35
TRIGLYCERIDES: 92 mg/dL (ref 0.0–149.0)
Total CHOL/HDL Ratio: 4
VLDL: 18.4 mg/dL (ref 0.0–40.0)

## 2017-01-10 LAB — HEPATIC FUNCTION PANEL
ALK PHOS: 60 U/L (ref 39–117)
ALT: 41 U/L — ABNORMAL HIGH (ref 0–35)
AST: 37 U/L (ref 0–37)
Albumin: 4.8 g/dL (ref 3.5–5.2)
BILIRUBIN TOTAL: 0.8 mg/dL (ref 0.2–1.2)
Bilirubin, Direct: 0.1 mg/dL (ref 0.0–0.3)
Total Protein: 7.8 g/dL (ref 6.0–8.3)

## 2017-01-10 LAB — SEDIMENTATION RATE: SED RATE: 18 mm/h (ref 0–30)

## 2017-01-10 MED ORDER — TRIAMTERENE-HCTZ 37.5-25 MG PO TABS: 1.0000 | ORAL_TABLET | Freq: Every day | ORAL | 11 refills | Status: DC

## 2017-01-10 MED ORDER — MUPIROCIN 2 % EX OINT: TOPICAL_OINTMENT | CUTANEOUS | 0 refills | Status: DC

## 2017-01-10 MED ORDER — PANTOPRAZOLE SODIUM 40 MG PO TBEC: 40.0000 mg | DELAYED_RELEASE_TABLET | Freq: Every day | ORAL | 11 refills | Status: DC

## 2017-01-10 MED ORDER — BENAZEPRIL HCL 40 MG PO TABS: 40.0000 mg | ORAL_TABLET | Freq: Every day | ORAL | 11 refills | Status: DC

## 2017-01-10 NOTE — Patient Instructions (Signed)
MC well w/Jill 

## 2017-01-10 NOTE — Assessment & Plan Note (Signed)
4/18 multiple sx's Labs Abd CT

## 2017-01-10 NOTE — Progress Notes (Signed)
Pre visit review using our clinic review tool, if applicable. No additional management support is needed unless otherwise documented below in the visit note. 

## 2017-01-10 NOTE — Progress Notes (Signed)
Subjective:  Patient ID: Alexandra Scott, female    DOB: 1946/06/07  Age: 71 y.o. MRN: [id number]  CC: No chief complaint on file.   HPI AVERLY ERICSON presents for belching and GERD sx's C/o feeling "full" x 6 mo C/o wt gain C/o bowel habit change x 4 mo C/o ankles swelling at night  Outpatient Medications Prior to Visit  Medication Sig Dispense Refill  . aspirin 81 MG EC tablet Take 81 mg by mouth daily.      . benazepril (LOTENSIN) 40 MG tablet Take 1 tablet (40 mg total) by mouth daily. Yearly physical w/labs is due must see MD for refills 30 tablet 0  . Cholecalciferol 1000 UNITS capsule Take 1,000 Units by mouth daily.    . Magnesium 250 MG TABS Take by mouth daily.    . Omega-3 Fatty Acids (FISH OIL) 1000 MG CAPS Take by mouth daily. Reported on 01/11/2016    . triamterene-hydrochlorothiazide (MAXZIDE-25) 37.5-25 MG tablet TAKE ONE TABLET BY MOUTH ONCE DAILY 30 tablet 0  . vitamin C (ASCORBIC ACID) 500 MG tablet Take 500 mg by mouth daily.    . ciprofloxacin (CIPRO) 250 MG tablet Take 1 tablet (250 mg total) by mouth 2 (two) times daily. 14 tablet 0  . Multiple Minerals-Vitamins (CALCIUM & VIT D3 BONE HEALTH PO) Take by mouth. Reported on 01/11/2016     No facility-administered medications prior to visit.     ROS Review of Systems  Constitutional: Positive for fatigue and unexpected weight change. Negative for activity change, appetite change and chills.  HENT: Negative for congestion, mouth sores and sinus pressure.   Eyes: Negative for visual disturbance.  Respiratory: Negative for cough and chest tightness.   Cardiovascular: Positive for leg swelling.  Gastrointestinal: Positive for abdominal distention. Negative for abdominal pain and nausea.  Genitourinary: Negative for difficulty urinating, frequency and vaginal pain.  Musculoskeletal: Negative for back pain and gait problem.  Skin: Negative for pallor and rash.  Neurological: Negative for dizziness, tremors,  weakness, numbness and headaches.  Psychiatric/Behavioral: Negative for confusion, sleep disturbance and suicidal ideas.  obese  Objective:  BP 130/76 (BP Location: Left Arm, Patient Position: Sitting, Cuff Size: Large)   Pulse 88   Temp 98.5 F (36.9 C) (Oral)   Ht 5' (1.524 m)   Wt 170 lb (77.1 kg)   SpO2 98%   BMI 33.20 kg/m   BP Readings from Last 3 Encounters:  01/10/17 130/76  01/11/16 122/68  09/25/15 128/84    Wt Readings from Last 3 Encounters:  01/10/17 170 lb (77.1 kg)  01/11/16 158 lb (71.7 kg)  09/25/15 164 lb (74.4 kg)    Physical Exam  Constitutional: She appears well-developed. No distress.  HENT:  Head: Normocephalic.  Right Ear: External ear normal.  Left Ear: External ear normal.  Nose: Nose normal.  Mouth/Throat: Oropharynx is clear and moist.  Eyes: Conjunctivae are normal. Pupils are equal, round, and reactive to light. Right eye exhibits no discharge. Left eye exhibits no discharge.  Neck: Normal range of motion. Neck supple. No JVD present. No tracheal deviation present. No thyromegaly present.  Cardiovascular: Normal rate, regular rhythm and normal heart sounds.   Pulmonary/Chest: No stridor. No respiratory distress. She has no wheezes.  Abdominal: Soft. Bowel sounds are normal. She exhibits distension. She exhibits no mass. There is tenderness. There is no rebound and no guarding.  Musculoskeletal: She exhibits no edema or tenderness.  Lymphadenopathy:    She has no cervical  adenopathy.  Neurological: She displays normal reflexes. No cranial nerve deficit. She exhibits normal muscle tone. Coordination normal.  Skin: No rash noted. No erythema.  Psychiatric: She has a normal mood and affect. Her behavior is normal. Judgment and thought content normal.  large abd  Lab Results  Component Value Date   WBC 7.0 01/11/2016   HGB 11.9 (L) 01/11/2016   HCT 34.9 (L) 01/11/2016   PLT 263.0 01/11/2016   GLUCOSE 92 01/11/2016   CHOL 165 01/11/2016     TRIG 91.0 01/11/2016   HDL 42.00 01/11/2016   LDLDIRECT 150.6 10/15/2007   LDLCALC 104 (H) 01/11/2016   ALT 24 01/11/2016   AST 29 01/11/2016   NA 137 01/11/2016   K 3.9 01/11/2016   CL 101 01/11/2016   CREATININE 0.98 01/11/2016   BUN 19 01/11/2016   CO2 26 01/11/2016   TSH 2.07 01/11/2016   INR 0.97 03/29/2011    Dg Chest 2 View  Result Date: 01/12/2016 CLINICAL DATA:  Nonproductive cough for uncertain length of time EXAM: CHEST  2 VIEW COMPARISON:  05/21/2014 FINDINGS: Dextroscoliosis at the thoracolumbar junction. Cardiac silhouette upper normal in size. Vascular pattern normal. Minimal scarring or atelectasis at the left base. Minimal right base atelectasis. No consolidation or effusion. IMPRESSION: No active cardiopulmonary disease. Electronically Signed   By: Skipper Cliche M.D.   On: 01/12/2016 08:17    Assessment & Plan:   There are no diagnoses linked to this encounter. I have discontinued Ms. Kronenberger's Multiple Minerals-Vitamins (CALCIUM & VIT D3 BONE HEALTH PO) and ciprofloxacin. I am also having her maintain her aspirin, vitamin C, Cholecalciferol, Fish Oil, Magnesium, triamterene-hydrochlorothiazide, and benazepril.  No orders of the defined types were placed in this encounter.    Follow-up: No Follow-up on file.  Walker Kehr, MD

## 2017-01-10 NOTE — Assessment & Plan Note (Signed)
Start Protonix 

## 2017-01-10 NOTE — Assessment & Plan Note (Signed)
Mupirocin Rx

## 2017-01-10 NOTE — Assessment & Plan Note (Signed)
BDS 

## 2017-01-11 MED ORDER — CIPROFLOXACIN HCL 250 MG PO TABS: 250.0000 mg | ORAL_TABLET | Freq: Two times a day (BID) | ORAL | 0 refills | Status: DC

## 2017-01-12 ENCOUNTER — Telehealth: Payer: Self-pay | Admitting: Internal Medicine

## 2017-01-16 ENCOUNTER — Ambulatory Visit (INDEPENDENT_AMBULATORY_CARE_PROVIDER_SITE_OTHER)
Admission: RE | Admit: 2017-01-16 | Discharge: 2017-01-16 | Disposition: A | Discharge status: Home / Self Care | Payer: Medicare Other | Source: Ambulatory Visit | Attending: Internal Medicine | Admitting: Internal Medicine

## 2017-01-16 DIAGNOSIS — M810 Age-related osteoporosis without current pathological fracture: Secondary | ICD-10-CM

## 2017-01-23 ENCOUNTER — Ambulatory Visit (INDEPENDENT_AMBULATORY_CARE_PROVIDER_SITE_OTHER)
Admission: RE | Admit: 2017-01-23 | Discharge: 2017-01-23 | Disposition: A | Discharge status: Home / Self Care | Payer: Medicare Other | Source: Ambulatory Visit | Attending: Internal Medicine | Admitting: Internal Medicine

## 2017-01-23 DIAGNOSIS — R109 Unspecified abdominal pain: Secondary | ICD-10-CM

## 2017-01-23 MED ORDER — IOPAMIDOL (ISOVUE-300) INJECTION 61%
100.0000 mL | Freq: Once | INTRAVENOUS | Status: AC | PRN
  Administered 2017-01-23: 100 mL via INTRAVENOUS

## 2017-01-31 NOTE — Progress Notes (Signed)
Pre visit review using our clinic review tool, if applicable. No additional management support is needed unless otherwise documented below in the visit note. 

## 2017-01-31 NOTE — Progress Notes (Signed)
Subjective:   Alexandra Scott is a 71 y.o. female who presents for Medicare Annual (Subsequent) preventive examination.  Review of Systems:  No ROS.  Medicare Wellness Visit.  Cardiac Risk Factors include: advanced age (>71men, >12 women);hypertension;dyslipidemia Sleep patterns: has frequent nighttime awakenings, feels rested on waking, gets up 1-2 times nightly to void and sleeps 5-6 hours nightly.  Patient reports long-term insomnia issues, discussed recommended sleep tips and stress reduction tips.    Home Safety/Smoke Alarms:   Living environment; residence and Firearm Safety: 2-story house, no firearms. Lives with husband, no DME needed at this time. Seat Belt Safety/Bike Helmet: Wears seat belt.   Counseling:   Eye Exam- appointment yearly Dental- appointment every 6 months   Female:   Pap- N/A       Mammo- Last 05/13/15, BI-RADS category 2: Benign       Dexa scan- Last 01/16/17, normal       CCS- Last 07/08/15, non-cancerous polyps, recall 5 years      Objective:     Vitals: BP 120/78 (BP Location: Right Arm, Patient Position: Sitting, Cuff Size: Large)   Pulse 86   Temp 98.6 F (37 C) (Oral)   Ht 5' (1.524 m)   Wt 169 lb 1.9 oz (76.7 kg)   SpO2 97%   BMI 33.03 kg/m   Body mass index is 33.03 kg/m.   Tobacco History  Smoking Status  . Never Smoker  Smokeless Tobacco  . Never Used     Counseling given: Not Answered   Past Medical History:  Diagnosis Date  . Breast cyst 2008   Left / SER  . Colon polyps   . Constipation   . GERD (gastroesophageal reflux disease)    uses OTC  . HTN (hypertension)   . LBP (low back pain)    ? Spinal stenosis  . Osteoarthritis   . Osteoarthritis of hip 2011   Right/Dr Fields   Past Surgical History:  Procedure Laterality Date  . BREAST DUCTAL SYSTEM EXCISION Left 05/22/2014   Procedure: LEFT NIPPLE DUCT EXCISION;  Surgeon: Imogene Burn. Georgette Dover, MD;  Location: [facility];  Service: General;  Laterality: Left;  .  COLONOSCOPY W/ BIOPSIES AND POLYPECTOMY    . JOINT REPLACEMENT  7/12   R THR   Family History  Problem Relation Age of Onset  . Colon cancer Maternal Uncle   . Hypertension Other   . Breast cancer Other    History  Sexual Activity  . Sexual activity: Not on file    Outpatient Encounter Prescriptions as of 02/06/2017  Medication Sig  . aspirin 81 MG EC tablet Take 81 mg by mouth daily.    . benazepril (LOTENSIN) 40 MG tablet Take 1 tablet (40 mg total) by mouth daily. Yearly physical w/labs is due must see MD for refills  . Cholecalciferol 1000 UNITS capsule Take 1,000 Units by mouth daily.  . ciprofloxacin (CIPRO) 250 MG tablet Take 1 tablet (250 mg total) by mouth 2 (two) times daily.  . Magnesium 250 MG TABS Take by mouth daily.  . mupirocin ointment (BACTROBAN) 2 % In the L nostril qid  . Omega-3 Fatty Acids (FISH OIL) 1000 MG CAPS Take by mouth daily. Reported on 01/11/2016  . pantoprazole (PROTONIX) 40 MG tablet Take 1 tablet (40 mg total) by mouth daily.  Marland Kitchen triamterene-hydrochlorothiazide (MAXZIDE-25) 37.5-25 MG tablet Take 1 tablet by mouth daily.  . vitamin C (ASCORBIC ACID) 500 MG tablet Take 500 mg by mouth daily.  No facility-administered encounter medications on file as of 02/06/2017.     Activities of Daily Living In your present state of health, do you have any difficulty performing the following activities: 02/06/2017  Hearing? N  Vision? N  Difficulty concentrating or making decisions? N  Walking or climbing stairs? N  Dressing or bathing? N  Doing errands, shopping? N  Preparing Food and eating ? N  Using the Toilet? N  In the past six months, have you accidently leaked urine? N  Do you have problems with loss of bowel control? N  Managing your Medications? N  Managing your Finances? N  Housekeeping or managing your Housekeeping? N  Some recent data might be hidden    Patient Care Team: Plotnikov, Evie Lacks, MD as PCP - General Barbaraann Rondo, MD  (Obstetrics and Gynecology) Ladene Artist, MD (Gastroenterology) Gaynelle Arabian, MD (Orthopedic Surgery)    Assessment:    Physical assessment deferred to PCP.  Exercise Activities and Dietary recommendations Current Exercise Habits: The patient does not participate in regular exercise at present, Exercise limited by: None identified   Diet (meal preparation, eat out, water intake, caffeinated beverages, dairy products, fruits and vegetables): in general, a "healthy" diet  , well balanced, reports that she and husband eats out frequently which makes it difficult to eat healthy, reports drinking 2-3 glasses of water daily, 1-2 glasses of tea, and 1-2 glasses of coffee.  Discussed healthy decisions when eating out. Reviewed heart healthy diet and encouraged patient to increase daily water intake.    Goals    . Be as healthy as possible          Start to think more about what I am eating and about how to increase my activity. I will start to try to drink at least a quart of water per day.       Fall Risk Fall Risk  02/06/2017 06/30/2015  Falls in the past year? No No   Depression Screen PHQ 2/9 Scores 02/06/2017 06/30/2015  PHQ - 2 Score 0 0     Cognitive Function       Ad8 score reviewed for issues:  Issues making decisions: no  Less interest in hobbies / activities: no  Repeats questions, stories (family complaining): no  Trouble using ordinary gadgets (microwave, computer, phone): no  Forgets the month or year: no  Mismanaging finances: no  Remembering appts: no  Daily problems with thinking and/or memory: no Ad8 score is= 0  Immunization History  Administered Date(s) Administered  . Influenza,inj,Quad PF,36+ Mos 06/30/2014, 06/30/2015  . Pneumococcal Conjugate-13 01/11/2016  . Td 11/19/2010   Screening Tests Health Maintenance  Topic Date Due  . Hepatitis C Screening  01-05-1946  . PNA vac Low Risk Adult (2 of 2 - PPSV23) 01/10/2017  . INFLUENZA  VACCINE  04/26/2017  . MAMMOGRAM  05/12/2017  . COLONOSCOPY  07/07/2020  . TETANUS/TDAP  11/19/2020  . DEXA SCAN  Completed      Plan:    Start to eat heart healthy diet (full of fruits, vegetables, whole grains, lean protein, water--limit salt, fat, and sugar intake) and increase physical activity as tolerated.  Continue doing brain stimulating activities (puzzles, reading, adult coloring books, staying active) to keep memory sharp.   I have personally reviewed and noted the following in the patient's chart:   . Medical and social history . Use of alcohol, tobacco or illicit drugs  . Current medications and supplements . Functional ability and status .  Nutritional status . Physical activity . Advanced directives . List of other physicians . Hospitalizations, surgeries, and ER visits in previous 12 months . Vitals . Screenings to include cognitive, depression, and falls . Referrals and appointments  In addition, I have reviewed and discussed with patient certain preventive protocols, quality metrics, and best practice recommendations. A written personalized care plan for preventive services as well as general preventive health recommendations were provided to patient.     Michiel Cowboy, RN  02/06/2017  Medical screening examination/treatment/procedure(s) were performed by non-physician practitioner and as supervising physician I was immediately available for consultation/collaboration. I agree with above. Walker Kehr, MD

## 2017-02-06 ENCOUNTER — Encounter: Payer: Self-pay | Admitting: Internal Medicine

## 2017-02-06 ENCOUNTER — Ambulatory Visit (INDEPENDENT_AMBULATORY_CARE_PROVIDER_SITE_OTHER): Payer: Medicare Other | Admitting: Internal Medicine

## 2017-02-06 VITALS — BP 120/78 | HR 86 | Temp 98.6°F | Ht 60.0 in | Wt 169.1 lb

## 2017-02-06 DIAGNOSIS — I10 Essential (primary) hypertension: Secondary | ICD-10-CM

## 2017-02-06 DIAGNOSIS — F439 Reaction to severe stress, unspecified: Secondary | ICD-10-CM | POA: Diagnosis not present

## 2017-02-06 DIAGNOSIS — K219 Gastro-esophageal reflux disease without esophagitis: Secondary | ICD-10-CM | POA: Diagnosis not present

## 2017-02-06 DIAGNOSIS — Z0000 Encounter for general adult medical examination without abnormal findings: Secondary | ICD-10-CM

## 2017-02-06 DIAGNOSIS — M79604 Pain in right leg: Secondary | ICD-10-CM | POA: Diagnosis not present

## 2017-02-06 NOTE — Assessment & Plan Note (Addendum)
GD with leukemia - discussed

## 2017-02-06 NOTE — Assessment & Plan Note (Signed)
Better w/diet change Use pantoprazole if re-occurred

## 2017-02-06 NOTE — Assessment & Plan Note (Signed)
R lat shin 2 cm soft swelling, bluish - bruise vs superficial phlebitis ASA 325 mg bid RTC if not better

## 2017-02-06 NOTE — Assessment & Plan Note (Signed)
Benazepril, Maxzide 

## 2017-02-06 NOTE — Patient Instructions (Addendum)
Take Aspirin 325 mg twice a day with food until better  Start to eat heart healthy diet (full of fruits, vegetables, whole grains, lean protein, water--limit salt, fat, and sugar intake) and increase physical activity as tolerated.  Continue doing brain stimulating activities (puzzles, reading, adult coloring books, staying active) to keep memory sharp.    Alexandra Scott , Thank you for taking time to come for your Medicare Wellness Visit. I appreciate your ongoing commitment to your health goals. Please review the following plan we discussed and let me know if I can assist you in the future.   These are the goals we discussed: Goals    . Be as healthy as possible          Start to think more about what I am eating and about how to increase my activity. I will start to try to drink at least a quart of water per day.        This is a list of the screening recommended for you and due dates:  Health Maintenance  Topic Date Due  .  Hepatitis C: One time screening is recommended by Center for Disease Control  (CDC) for  adults born from [birth year] through 1965.   1945/10/11  . Pneumonia vaccines (2 of 2 - PPSV23) 01/10/2017  . Flu Shot  04/26/2017  . Mammogram  05/12/2017  . Colon Cancer Screening  07/07/2020  . Tetanus Vaccine  11/19/2020  . DEXA scan (bone density measurement)  Completed

## 2017-02-06 NOTE — Progress Notes (Signed)
Subjective:  Patient ID: Alexandra Scott, female    DOB: 1946/06/30  Age: 71 y.o. MRN: [id number]  CC: No chief complaint on file.   HPI KENSY BLIZARD presents for a knot and pain on the R lat shin from last week F/u GERD - resolving w/diet change F/u HTN  Outpatient Medications Prior to Visit  Medication Sig Dispense Refill  . aspirin 81 MG EC tablet Take 81 mg by mouth daily.      . benazepril (LOTENSIN) 40 MG tablet Take 1 tablet (40 mg total) by mouth daily. Yearly physical w/labs is due must see MD for refills 30 tablet 11  . Cholecalciferol 1000 UNITS capsule Take 1,000 Units by mouth daily.    . ciprofloxacin (CIPRO) 250 MG tablet Take 1 tablet (250 mg total) by mouth 2 (two) times daily. 14 tablet 0  . Magnesium 250 MG TABS Take by mouth daily.    . mupirocin ointment (BACTROBAN) 2 % In the L nostril qid 22 g 0  . Omega-3 Fatty Acids (FISH OIL) 1000 MG CAPS Take by mouth daily. Reported on 01/11/2016    . pantoprazole (PROTONIX) 40 MG tablet Take 1 tablet (40 mg total) by mouth daily. 30 tablet 11  . triamterene-hydrochlorothiazide (MAXZIDE-25) 37.5-25 MG tablet Take 1 tablet by mouth daily. 30 tablet 11  . vitamin C (ASCORBIC ACID) 500 MG tablet Take 500 mg by mouth daily.     No facility-administered medications prior to visit.     ROS Review of Systems  Constitutional: Negative for activity change, appetite change, chills, fatigue and unexpected weight change.  HENT: Negative for congestion, mouth sores and sinus pressure.   Eyes: Negative for visual disturbance.  Respiratory: Negative for cough and chest tightness.   Gastrointestinal: Negative for abdominal pain and nausea.  Genitourinary: Negative for difficulty urinating, frequency and vaginal pain.  Musculoskeletal: Negative for back pain and gait problem.  Skin: Positive for color change. Negative for pallor and rash.  Neurological: Negative for dizziness, tremors, weakness, numbness and headaches.    Psychiatric/Behavioral: Negative for confusion and sleep disturbance.    Objective:  BP 120/78 (BP Location: Right Arm, Patient Position: Sitting, Cuff Size: Large)   Pulse 86   Temp 98.6 F (37 C) (Oral)   Ht 5' (1.524 m)   Wt 169 lb 1.9 oz (76.7 kg)   SpO2 97%   BMI 33.03 kg/m   BP Readings from Last 3 Encounters:  02/06/17 120/78  01/10/17 130/76  01/11/16 122/68    Wt Readings from Last 3 Encounters:  02/06/17 169 lb 1.9 oz (76.7 kg)  01/10/17 170 lb (77.1 kg)  01/11/16 158 lb (71.7 kg)    Physical Exam  Constitutional: She appears well-developed. No distress.  HENT:  Head: Normocephalic.  Right Ear: External ear normal.  Left Ear: External ear normal.  Nose: Nose normal.  Mouth/Throat: Oropharynx is clear and moist.  Eyes: Conjunctivae are normal. Pupils are equal, round, and reactive to light. Right eye exhibits no discharge. Left eye exhibits no discharge.  Neck: Normal range of motion. Neck supple. No JVD present. No tracheal deviation present. No thyromegaly present.  Cardiovascular: Normal rate, regular rhythm and normal heart sounds.   Pulmonary/Chest: No stridor. No respiratory distress. She has no wheezes.  Abdominal: Soft. Bowel sounds are normal. She exhibits no distension and no mass. There is no tenderness. There is no rebound and no guarding.  Musculoskeletal: She exhibits tenderness. She exhibits no edema.  Lymphadenopathy:  She has no cervical adenopathy.  Neurological: She displays normal reflexes. No cranial nerve deficit. She exhibits normal muscle tone. Coordination normal.  Skin: No rash noted. No erythema.  Psychiatric: She has a normal mood and affect. Her behavior is normal. Judgment and thought content normal.  R lat shin 2 cm soft swelling, bluish  Lab Results  Component Value Date   WBC 7.9 01/10/2017   HGB 12.9 01/10/2017   HCT 38.6 01/10/2017   PLT 266.0 01/10/2017   GLUCOSE 110 (H) 01/10/2017   CHOL 171 01/10/2017   TRIG  92.0 01/10/2017   HDL 45.50 01/10/2017   LDLDIRECT 150.6 10/15/2007   LDLCALC 107 (H) 01/10/2017   ALT 41 (H) 01/10/2017   AST 37 01/10/2017   NA 133 (L) 01/10/2017   K 4.4 01/10/2017   CL 97 01/10/2017   CREATININE 1.02 01/10/2017   BUN 23 01/10/2017   CO2 28 01/10/2017   TSH 3.18 01/10/2017   INR 0.97 03/29/2011    Ct Abdomen Pelvis W Contrast  Result Date: 01/23/2017 CLINICAL DATA:  71 year old female with history of abdominal pain and bloating with leg swelling for the past 4 months. EXAM: CT ABDOMEN AND PELVIS WITH CONTRAST TECHNIQUE: Multidetector CT imaging of the abdomen and pelvis was performed using the standard protocol following bolus administration of intravenous contrast. CONTRAST:  150mL ISOVUE-300 IOPAMIDOL (ISOVUE-300) INJECTION 61% COMPARISON:  No priors. FINDINGS: Lower chest: Unremarkable. Hepatobiliary: Diffuse low attenuation throughout the hepatic parenchyma, compatible with severe hepatic steatosis. No cystic or solid hepatic lesions. No intra or extrahepatic biliary ductal dilatation. Gallbladder is normal in appearance. Pancreas: No pancreatic mass. No pancreatic ductal dilatation. No pancreatic or peripancreatic fluid or inflammatory changes. Spleen: Unremarkable. Adrenals/Urinary Tract: Bilateral adrenal glands and bilateral kidneys are normal in appearance. There is no hydroureteronephrosis. Urinary bladder is normal in appearance. Stomach/Bowel: The appearance of the stomach is normal. There is no pathologic dilatation of small bowel or colon. Numerous colonic diverticulae are noted, without surrounding inflammatory changes to suggest an acute diverticulitis at this time. Normal appendix. Vascular/Lymphatic: No significant atherosclerotic disease, aneurysm or dissection noted in the abdominal or pelvic vasculature. No lymphadenopathy noted in the abdomen or pelvis. Reproductive: Uterus and ovaries are atrophic. Other: No significant volume of ascites.  No  pneumoperitoneum. Musculoskeletal: Right-sided hip arthroplasty. There are no aggressive appearing lytic or blastic lesions noted in the visualized portions of the skeleton. IMPRESSION: 1. No acute findings are noted in the abdomen or pelvis to account for the patient's symptoms. 2. Severe hepatic steatosis. 3. Severe colonic diverticulosis, without with acute inflammatory changes to suggest an acute diverticulitis at this time. Electronically Signed   By: Vinnie Langton M.D.   On: 01/23/2017 09:51    Assessment & Plan:   There are no diagnoses linked to this encounter. I am having Ms. Rumbaugh maintain her aspirin, vitamin C, Cholecalciferol, Fish Oil, Magnesium, benazepril, triamterene-hydrochlorothiazide, mupirocin ointment, pantoprazole, and ciprofloxacin.  No orders of the defined types were placed in this encounter.    Follow-up: No Follow-up on file.  Walker Kehr, MD

## 2017-03-07 ENCOUNTER — Ambulatory Visit (INDEPENDENT_AMBULATORY_CARE_PROVIDER_SITE_OTHER): Payer: Medicare Other | Admitting: Internal Medicine

## 2017-03-07 ENCOUNTER — Encounter: Payer: Self-pay | Admitting: Internal Medicine

## 2017-03-07 DIAGNOSIS — L84 Corns and callosities: Secondary | ICD-10-CM | POA: Insufficient documentation

## 2017-03-07 DIAGNOSIS — H04123 Dry eye syndrome of bilateral lacrimal glands: Secondary | ICD-10-CM | POA: Diagnosis not present

## 2017-03-07 DIAGNOSIS — M79604 Pain in right leg: Secondary | ICD-10-CM

## 2017-03-07 MED ORDER — ZOSTER VAC RECOMB ADJUVANTED 50 MCG/0.5ML IM SUSR: 0.5000 mL | Freq: Once | INTRAMUSCULAR | 1 refills | Status: AC

## 2017-03-07 NOTE — Assessment & Plan Note (Addendum)
Clinically better POC [place]: subcutaneous fat tissue swelling vs lipoma

## 2017-03-07 NOTE — Progress Notes (Signed)
Subjective:  Patient ID: Alexandra Scott, female    DOB: 1945/11/14  Age: 71 y.o. MRN: [id number]  CC: No chief complaint on file.   HPI Alexandra Scott presents for a skin swelling on the R shin f/u C/o R lat foot pain/calluces  Outpatient Medications Prior to Visit  Medication Sig Dispense Refill  . aspirin 81 MG EC tablet Take 81 mg by mouth daily.      . benazepril (LOTENSIN) 40 MG tablet Take 1 tablet (40 mg total) by mouth daily. Yearly physical w/labs is due must see MD for refills 30 tablet 11  . Cholecalciferol 1000 UNITS capsule Take 1,000 Units by mouth daily.    . ciprofloxacin (CIPRO) 250 MG tablet Take 1 tablet (250 mg total) by mouth 2 (two) times daily. 14 tablet 0  . Magnesium 250 MG TABS Take by mouth daily.    . Omega-3 Fatty Acids (FISH OIL) 1000 MG CAPS Take by mouth daily. Reported on 01/11/2016    . triamterene-hydrochlorothiazide (MAXZIDE-25) 37.5-25 MG tablet Take 1 tablet by mouth daily. 30 tablet 11  . vitamin C (ASCORBIC ACID) 500 MG tablet Take 500 mg by mouth daily.    . mupirocin ointment (BACTROBAN) 2 % In the L nostril qid 22 g 0  . pantoprazole (PROTONIX) 40 MG tablet Take 1 tablet (40 mg total) by mouth daily. 30 tablet 11   No facility-administered medications prior to visit.     ROS Review of Systems  Constitutional: Negative for activity change, appetite change, chills, fatigue and unexpected weight change.  HENT: Negative for congestion, mouth sores and sinus pressure.   Eyes: Negative for visual disturbance.  Respiratory: Negative for cough and chest tightness.   Gastrointestinal: Negative for abdominal pain and nausea.  Genitourinary: Negative for difficulty urinating, frequency and vaginal pain.  Musculoskeletal: Negative for back pain and gait problem.  Skin: Negative for pallor, rash and wound.  Neurological: Negative for dizziness, tremors, weakness, numbness and headaches.  Psychiatric/Behavioral: Negative for confusion and  sleep disturbance.      Objective:  BP 118/68 (BP Location: Left Arm, Patient Position: Sitting, Cuff Size: Large)   Pulse 82   Temp 98.3 F (36.8 C) (Oral)   Ht 5' (1.524 m)   Wt 168 lb (76.2 kg)   SpO2 98%   BMI 32.81 kg/m   BP Readings from Last 3 Encounters:  03/07/17 118/68  02/06/17 120/78  01/10/17 130/76    Wt Readings from Last 3 Encounters:  03/07/17 168 lb (76.2 kg)  02/06/17 169 lb 1.9 oz (76.7 kg)  01/10/17 170 lb (77.1 kg)    Physical Exam  Constitutional: She appears well-developed. No distress.  HENT:  Head: Normocephalic.  Right Ear: External ear normal.  Left Ear: External ear normal.  Nose: Nose normal.  Mouth/Throat: Oropharynx is clear and moist.  Eyes: Conjunctivae are normal. Pupils are equal, round, and reactive to light. Right eye exhibits no discharge. Left eye exhibits no discharge.  Neck: Normal range of motion. Neck supple. No JVD present. No tracheal deviation present. No thyromegaly present.  Cardiovascular: Normal rate, regular rhythm and normal heart sounds.   Pulmonary/Chest: No stridor. No respiratory distress. She has no wheezes.  Abdominal: Soft. Bowel sounds are normal. She exhibits no distension and no mass. There is no tenderness. There is no rebound and no guarding.  Musculoskeletal: She exhibits tenderness. She exhibits no edema.  Lymphadenopathy:    She has no cervical adenopathy.  Neurological: She displays normal  reflexes. No cranial nerve deficit. She exhibits normal muscle tone. Coordination normal.  Skin: No rash noted. No erythema.  Psychiatric: She has a normal mood and affect. Her behavior is normal. Judgment and thought content normal.    R foot keratoses x3 R shin ?lipoma  POC [place]: subcutaneous fat tissue swelling vs lipoma FTF>20 min  Procedure:    foot callus paring/cutting  Indication:    foot callus, painful  Consent: verbal  Risks and benefits were explained to the patient. Skin was cleaned with  alcohol. I removed 3 calluses carefully with a round blade. Skin remained intact. Pain is better. Tolerated well. Complications: none. Bandaid applied   Lab Results  Component Value Date   WBC 7.9 01/10/2017   HGB 12.9 01/10/2017   HCT 38.6 01/10/2017   PLT 266.0 01/10/2017   GLUCOSE 110 (H) 01/10/2017   CHOL 171 01/10/2017   TRIG 92.0 01/10/2017   HDL 45.50 01/10/2017   LDLDIRECT 150.6 10/15/2007   LDLCALC 107 (H) 01/10/2017   ALT 41 (H) 01/10/2017   AST 37 01/10/2017   NA 133 (L) 01/10/2017   K 4.4 01/10/2017   CL 97 01/10/2017   CREATININE 1.02 01/10/2017   BUN 23 01/10/2017   CO2 28 01/10/2017   TSH 3.18 01/10/2017   INR 0.97 03/29/2011    Ct Abdomen Pelvis W Contrast  Result Date: 01/23/2017 CLINICAL DATA:  71 year old female with history of abdominal pain and bloating with leg swelling for the past 4 months. EXAM: CT ABDOMEN AND PELVIS WITH CONTRAST TECHNIQUE: Multidetector CT imaging of the abdomen and pelvis was performed using the standard protocol following bolus administration of intravenous contrast. CONTRAST:  150mL ISOVUE-300 IOPAMIDOL (ISOVUE-300) INJECTION 61% COMPARISON:  No priors. FINDINGS: Lower chest: Unremarkable. Hepatobiliary: Diffuse low attenuation throughout the hepatic parenchyma, compatible with severe hepatic steatosis. No cystic or solid hepatic lesions. No intra or extrahepatic biliary ductal dilatation. Gallbladder is normal in appearance. Pancreas: No pancreatic mass. No pancreatic ductal dilatation. No pancreatic or peripancreatic fluid or inflammatory changes. Spleen: Unremarkable. Adrenals/Urinary Tract: Bilateral adrenal glands and bilateral kidneys are normal in appearance. There is no hydroureteronephrosis. Urinary bladder is normal in appearance. Stomach/Bowel: The appearance of the stomach is normal. There is no pathologic dilatation of small bowel or colon. Numerous colonic diverticulae are noted, without surrounding inflammatory changes to  suggest an acute diverticulitis at this time. Normal appendix. Vascular/Lymphatic: No significant atherosclerotic disease, aneurysm or dissection noted in the abdominal or pelvic vasculature. No lymphadenopathy noted in the abdomen or pelvis. Reproductive: Uterus and ovaries are atrophic. Other: No significant volume of ascites.  No pneumoperitoneum. Musculoskeletal: Right-sided hip arthroplasty. There are no aggressive appearing lytic or blastic lesions noted in the visualized portions of the skeleton. IMPRESSION: 1. No acute findings are noted in the abdomen or pelvis to account for the patient's symptoms. 2. Severe hepatic steatosis. 3. Severe colonic diverticulosis, without with acute inflammatory changes to suggest an acute diverticulitis at this time. Electronically Signed   By: Vinnie Langton M.D.   On: 01/23/2017 09:51    Assessment & Plan:   There are no diagnoses linked to this encounter. I have discontinued Ms. Weant's mupirocin ointment and pantoprazole. I am also having her maintain her aspirin, vitamin C, Cholecalciferol, Fish Oil, Magnesium, benazepril, triamterene-hydrochlorothiazide, and ciprofloxacin.  No orders of the defined types were placed in this encounter.    Follow-up: No Follow-up on file.  Walker Kehr, MD

## 2017-03-07 NOTE — Assessment & Plan Note (Signed)
R foot See procedure

## 2017-03-15 DIAGNOSIS — H578 Other specified disorders of eye and adnexa: Secondary | ICD-10-CM | POA: Diagnosis not present

## 2017-05-02 DIAGNOSIS — H10413 Chronic giant papillary conjunctivitis, bilateral: Secondary | ICD-10-CM | POA: Diagnosis not present

## 2017-05-02 DIAGNOSIS — H0289 Other specified disorders of eyelid: Secondary | ICD-10-CM | POA: Diagnosis not present

## 2017-05-02 DIAGNOSIS — H01131 Eczematous dermatitis of right upper eyelid: Secondary | ICD-10-CM | POA: Diagnosis not present

## 2017-05-02 DIAGNOSIS — H04123 Dry eye syndrome of bilateral lacrimal glands: Secondary | ICD-10-CM | POA: Diagnosis not present

## 2017-05-02 DIAGNOSIS — H01134 Eczematous dermatitis of left upper eyelid: Secondary | ICD-10-CM | POA: Diagnosis not present

## 2017-05-02 DIAGNOSIS — H01132 Eczematous dermatitis of right lower eyelid: Secondary | ICD-10-CM | POA: Diagnosis not present

## 2017-05-16 DIAGNOSIS — Z6832 Body mass index (BMI) 32.0-32.9, adult: Secondary | ICD-10-CM | POA: Diagnosis not present

## 2017-05-16 DIAGNOSIS — Z124 Encounter for screening for malignant neoplasm of cervix: Secondary | ICD-10-CM | POA: Diagnosis not present

## 2017-06-01 DIAGNOSIS — H5703 Miosis: Secondary | ICD-10-CM | POA: Diagnosis not present

## 2017-06-01 DIAGNOSIS — H0289 Other specified disorders of eyelid: Secondary | ICD-10-CM | POA: Diagnosis not present

## 2017-06-01 DIAGNOSIS — H10413 Chronic giant papillary conjunctivitis, bilateral: Secondary | ICD-10-CM | POA: Diagnosis not present

## 2017-06-01 DIAGNOSIS — H01025 Squamous blepharitis left lower eyelid: Secondary | ICD-10-CM | POA: Diagnosis not present

## 2017-06-01 DIAGNOSIS — H01024 Squamous blepharitis left upper eyelid: Secondary | ICD-10-CM | POA: Diagnosis not present

## 2017-06-01 DIAGNOSIS — H01022 Squamous blepharitis right lower eyelid: Secondary | ICD-10-CM | POA: Diagnosis not present

## 2017-06-01 DIAGNOSIS — H01021 Squamous blepharitis right upper eyelid: Secondary | ICD-10-CM | POA: Diagnosis not present

## 2017-06-01 DIAGNOSIS — H25813 Combined forms of age-related cataract, bilateral: Secondary | ICD-10-CM | POA: Diagnosis not present

## 2017-06-01 DIAGNOSIS — H04123 Dry eye syndrome of bilateral lacrimal glands: Secondary | ICD-10-CM | POA: Diagnosis not present

## 2017-07-26 DIAGNOSIS — M25551 Pain in right hip: Secondary | ICD-10-CM | POA: Diagnosis not present

## 2017-07-26 DIAGNOSIS — M25561 Pain in right knee: Secondary | ICD-10-CM | POA: Diagnosis not present

## 2017-07-26 DIAGNOSIS — M79671 Pain in right foot: Secondary | ICD-10-CM | POA: Diagnosis not present

## 2017-08-01 DIAGNOSIS — M25551 Pain in right hip: Secondary | ICD-10-CM | POA: Diagnosis not present

## 2017-08-10 DIAGNOSIS — M1611 Unilateral primary osteoarthritis, right hip: Secondary | ICD-10-CM | POA: Diagnosis not present

## 2017-08-10 DIAGNOSIS — R29898 Other symptoms and signs involving the musculoskeletal system: Secondary | ICD-10-CM | POA: Diagnosis not present

## 2017-08-24 DIAGNOSIS — Q828 Other specified congenital malformations of skin: Secondary | ICD-10-CM | POA: Diagnosis not present

## 2017-08-25 DIAGNOSIS — R29898 Other symptoms and signs involving the musculoskeletal system: Secondary | ICD-10-CM | POA: Diagnosis not present

## 2017-08-29 DIAGNOSIS — R29898 Other symptoms and signs involving the musculoskeletal system: Secondary | ICD-10-CM | POA: Diagnosis not present

## 2017-09-01 DIAGNOSIS — R29898 Other symptoms and signs involving the musculoskeletal system: Secondary | ICD-10-CM | POA: Diagnosis not present

## 2017-09-08 DIAGNOSIS — R29898 Other symptoms and signs involving the musculoskeletal system: Secondary | ICD-10-CM | POA: Diagnosis not present

## 2017-09-12 DIAGNOSIS — R29898 Other symptoms and signs involving the musculoskeletal system: Secondary | ICD-10-CM | POA: Diagnosis not present

## 2017-09-15 DIAGNOSIS — R29898 Other symptoms and signs involving the musculoskeletal system: Secondary | ICD-10-CM | POA: Diagnosis not present

## 2017-09-20 DIAGNOSIS — R29898 Other symptoms and signs involving the musculoskeletal system: Secondary | ICD-10-CM | POA: Diagnosis not present

## 2017-09-22 DIAGNOSIS — R29898 Other symptoms and signs involving the musculoskeletal system: Secondary | ICD-10-CM | POA: Diagnosis not present

## 2017-09-27 DIAGNOSIS — R531 Weakness: Secondary | ICD-10-CM | POA: Diagnosis not present

## 2017-09-27 DIAGNOSIS — M6281 Muscle weakness (generalized): Secondary | ICD-10-CM | POA: Diagnosis not present

## 2017-09-29 DIAGNOSIS — R531 Weakness: Secondary | ICD-10-CM | POA: Diagnosis not present

## 2017-10-03 DIAGNOSIS — R531 Weakness: Secondary | ICD-10-CM | POA: Diagnosis not present

## 2017-10-06 DIAGNOSIS — R531 Weakness: Secondary | ICD-10-CM | POA: Diagnosis not present

## 2017-10-10 DIAGNOSIS — R531 Weakness: Secondary | ICD-10-CM | POA: Diagnosis not present

## 2017-10-10 DIAGNOSIS — Z1231 Encounter for screening mammogram for malignant neoplasm of breast: Secondary | ICD-10-CM | POA: Diagnosis not present

## 2017-10-10 DIAGNOSIS — M7061 Trochanteric bursitis, right hip: Secondary | ICD-10-CM | POA: Insufficient documentation

## 2017-10-10 DIAGNOSIS — Z803 Family history of malignant neoplasm of breast: Secondary | ICD-10-CM | POA: Diagnosis not present

## 2017-10-10 LAB — HM MAMMOGRAPHY

## 2017-10-12 DIAGNOSIS — M7061 Trochanteric bursitis, right hip: Secondary | ICD-10-CM | POA: Diagnosis not present

## 2017-10-12 DIAGNOSIS — Z96641 Presence of right artificial hip joint: Secondary | ICD-10-CM | POA: Diagnosis not present

## 2017-11-03 DIAGNOSIS — Z0000 Encounter for general adult medical examination without abnormal findings: Secondary | ICD-10-CM | POA: Diagnosis not present

## 2017-11-23 DIAGNOSIS — Z96641 Presence of right artificial hip joint: Secondary | ICD-10-CM | POA: Diagnosis not present

## 2017-11-23 DIAGNOSIS — M25561 Pain in right knee: Secondary | ICD-10-CM | POA: Diagnosis not present

## 2017-11-23 DIAGNOSIS — M7061 Trochanteric bursitis, right hip: Secondary | ICD-10-CM | POA: Diagnosis not present

## 2018-01-30 ENCOUNTER — Telehealth: Payer: Self-pay | Admitting: Internal Medicine

## 2018-01-30 ENCOUNTER — Other Ambulatory Visit: Payer: Self-pay | Admitting: Internal Medicine

## 2018-01-30 NOTE — Telephone Encounter (Unsigned)
Copied from [place]. Topic: Quick Communication - Rx Refill/Question >> Jan 30, 2018 11:36 AM Mcneil, Ja-Kwan wrote: Medication: benazepril (LOTENSIN) 40 MG tablet   and   triamterene-hydrochlorothiazide (MAXZIDE-25) 37.5-25 MG tablet  Preferred Pharmacy (with phone number or street name): [place], [place] [phone] (Phone) [phone] (Fax)  Agent: Please be advised that RX refills may take up to 3 business days. We ask that you follow-up with your pharmacy.

## 2018-02-06 ENCOUNTER — Ambulatory Visit (INDEPENDENT_AMBULATORY_CARE_PROVIDER_SITE_OTHER): Payer: Medicare Other | Admitting: Internal Medicine

## 2018-02-06 ENCOUNTER — Encounter: Payer: Self-pay | Admitting: Internal Medicine

## 2018-02-06 VITALS — BP 122/66 | HR 76 | Temp 98.4°F | Ht 60.0 in | Wt 167.0 lb

## 2018-02-06 DIAGNOSIS — Z23 Encounter for immunization: Secondary | ICD-10-CM

## 2018-02-06 DIAGNOSIS — E785 Hyperlipidemia, unspecified: Secondary | ICD-10-CM

## 2018-02-06 DIAGNOSIS — R5382 Chronic fatigue, unspecified: Secondary | ICD-10-CM

## 2018-02-06 DIAGNOSIS — E559 Vitamin D deficiency, unspecified: Secondary | ICD-10-CM | POA: Diagnosis not present

## 2018-02-06 DIAGNOSIS — I10 Essential (primary) hypertension: Secondary | ICD-10-CM

## 2018-02-06 DIAGNOSIS — R202 Paresthesia of skin: Secondary | ICD-10-CM | POA: Diagnosis not present

## 2018-02-06 DIAGNOSIS — D649 Anemia, unspecified: Secondary | ICD-10-CM | POA: Diagnosis not present

## 2018-02-06 MED ORDER — BENAZEPRIL HCL 40 MG PO TABS: ORAL_TABLET | ORAL | 11 refills | Status: DC

## 2018-02-06 MED ORDER — TRIAMTERENE-HCTZ 37.5-25 MG PO TABS
1.0000 | ORAL_TABLET | Freq: Every day | ORAL | 11 refills | Status: DC
Start: 2018-02-06 — End: 2018-10-11

## 2018-02-06 NOTE — Addendum Note (Signed)
Addended by: Karren Cobble on: 02/06/2018 04:03 PM   Modules accepted: Orders

## 2018-02-06 NOTE — Assessment & Plan Note (Signed)
Protonix.  ?

## 2018-02-06 NOTE — Patient Instructions (Signed)
MC well w/Jill 

## 2018-02-06 NOTE — Assessment & Plan Note (Signed)
Probable deconditioning, job related fatigue Labs Discussed

## 2018-02-06 NOTE — Assessment & Plan Note (Signed)
Benazepril, Maxzide

## 2018-02-06 NOTE — Progress Notes (Signed)
`   Subjective:  Patient ID: Alexandra Scott, female    DOB: 22-Aug-1946  Age: 72 y.o. MRN: [id number]  CC: No chief complaint on file.   HPI LEIGH BLAS presents for HTN F/u knee OA - seen Ortho C/o fatigue Staying very busy Pt had a mammo at [facility] - gyn appt pending  Outpatient Medications Prior to Visit  Medication Sig Dispense Refill  . aspirin 81 MG EC tablet Take 81 mg by mouth daily.      . benazepril (LOTENSIN) 40 MG tablet TAKE 1 TABLET BY MOUTH ONCE DAILY.  **YEARLY PHYSICAL WITH LABS IS DUE MUST SEE MD FOR REFILLS** 30 tablet 3  . Calcium Carbonate-Vitamin D3 (CALCIUM 600/VITAMIN D) 600-400 MG-UNIT TABS Take by mouth.    . Magnesium 250 MG TABS Take by mouth daily.    . Omega-3 Fatty Acids (FISH OIL) 1000 MG CAPS Take by mouth daily. Reported on 01/11/2016    . triamterene-hydrochlorothiazide (MAXZIDE-25) 37.5-25 MG tablet TAKE 1 TABLET BY MOUTH ONCE DAILY 30 tablet 3  . Turmeric 500 MG TABS Take by mouth 2 (two) times daily.    . vitamin C (ASCORBIC ACID) 500 MG tablet Take 500 mg by mouth daily.    . Cholecalciferol 1000 UNITS capsule Take 1,000 Units by mouth daily.    . ciprofloxacin (CIPRO) 250 MG tablet Take 1 tablet (250 mg total) by mouth 2 (two) times daily. (Patient not taking: Reported on 02/06/2018) 14 tablet 0   No facility-administered medications prior to visit.     ROS Review of Systems  Constitutional: Positive for fatigue. Negative for activity change, appetite change, chills and unexpected weight change.  HENT: Negative for congestion, mouth sores and sinus pressure.   Eyes: Negative for visual disturbance.  Respiratory: Negative for cough and chest tightness.   Gastrointestinal: Negative for abdominal pain and nausea.  Genitourinary: Negative for difficulty urinating, frequency and vaginal pain.  Musculoskeletal: Positive for arthralgias and back pain. Negative for gait problem.  Skin: Negative for pallor and rash.  Neurological: Negative  for dizziness, tremors, weakness, numbness and headaches.  Psychiatric/Behavioral: Negative for confusion and sleep disturbance.    Objective:  BP 122/66 (BP Location: Left Arm, Patient Position: Sitting, Cuff Size: Large)   Pulse 76   Temp 98.4 F (36.9 C) (Oral)   Ht 5' (1.524 m)   Wt 167 lb (75.8 kg)   SpO2 98%   BMI 32.61 kg/m   BP Readings from Last 3 Encounters:  02/06/18 122/66  03/07/17 118/68  02/06/17 120/78    Wt Readings from Last 3 Encounters:  02/06/18 167 lb (75.8 kg)  03/07/17 168 lb (76.2 kg)  02/06/17 169 lb 1.9 oz (76.7 kg)    Physical Exam  Constitutional: She appears well-developed. No distress.  HENT:  Head: Normocephalic.  Right Ear: External ear normal.  Left Ear: External ear normal.  Nose: Nose normal.  Mouth/Throat: Oropharynx is clear and moist.  Eyes: Pupils are equal, round, and reactive to light. Conjunctivae are normal. Right eye exhibits no discharge. Left eye exhibits no discharge.  Neck: Normal range of motion. Neck supple. No JVD present. No tracheal deviation present. No thyromegaly present.  Cardiovascular: Normal rate, regular rhythm and normal heart sounds.  Pulmonary/Chest: No stridor. No respiratory distress. She has no wheezes.  Abdominal: Soft. Bowel sounds are normal. She exhibits no distension and no mass. There is no tenderness. There is no rebound and no guarding.  Musculoskeletal: She exhibits tenderness. She exhibits no  edema.  Lymphadenopathy:    She has no cervical adenopathy.  Neurological: She displays normal reflexes. No cranial nerve deficit. She exhibits normal muscle tone. Coordination normal.  Skin: No rash noted. No erythema.  Psychiatric: She has a normal mood and affect. Her behavior is normal. Judgment and thought content normal.   Obese R knee is tender w/ROM  Lab Results  Component Value Date   WBC 7.9 01/10/2017   HGB 12.9 01/10/2017   HCT 38.6 01/10/2017   PLT 266.0 01/10/2017   GLUCOSE 110 (H)  01/10/2017   CHOL 171 01/10/2017   TRIG 92.0 01/10/2017   HDL 45.50 01/10/2017   LDLDIRECT 150.6 10/15/2007   LDLCALC 107 (H) 01/10/2017   ALT 41 (H) 01/10/2017   AST 37 01/10/2017   NA 133 (L) 01/10/2017   K 4.4 01/10/2017   CL 97 01/10/2017   CREATININE 1.02 01/10/2017   BUN 23 01/10/2017   CO2 28 01/10/2017   TSH 3.18 01/10/2017   INR 0.97 03/29/2011    Ct Abdomen Pelvis W Contrast  Result Date: 01/23/2017 CLINICAL DATA:  72 year old female with history of abdominal pain and bloating with leg swelling for the past 4 months. EXAM: CT ABDOMEN AND PELVIS WITH CONTRAST TECHNIQUE: Multidetector CT imaging of the abdomen and pelvis was performed using the standard protocol following bolus administration of intravenous contrast. CONTRAST:  163mL ISOVUE-300 IOPAMIDOL (ISOVUE-300) INJECTION 61% COMPARISON:  No priors. FINDINGS: Lower chest: Unremarkable. Hepatobiliary: Diffuse low attenuation throughout the hepatic parenchyma, compatible with severe hepatic steatosis. No cystic or solid hepatic lesions. No intra or extrahepatic biliary ductal dilatation. Gallbladder is normal in appearance. Pancreas: No pancreatic mass. No pancreatic ductal dilatation. No pancreatic or peripancreatic fluid or inflammatory changes. Spleen: Unremarkable. Adrenals/Urinary Tract: Bilateral adrenal glands and bilateral kidneys are normal in appearance. There is no hydroureteronephrosis. Urinary bladder is normal in appearance. Stomach/Bowel: The appearance of the stomach is normal. There is no pathologic dilatation of small bowel or colon. Numerous colonic diverticulae are noted, without surrounding inflammatory changes to suggest an acute diverticulitis at this time. Normal appendix. Vascular/Lymphatic: No significant atherosclerotic disease, aneurysm or dissection noted in the abdominal or pelvic vasculature. No lymphadenopathy noted in the abdomen or pelvis. Reproductive: Uterus and ovaries are atrophic. Other: No  significant volume of ascites.  No pneumoperitoneum. Musculoskeletal: Right-sided hip arthroplasty. There are no aggressive appearing lytic or blastic lesions noted in the visualized portions of the skeleton. IMPRESSION: 1. No acute findings are noted in the abdomen or pelvis to account for the patient's symptoms. 2. Severe hepatic steatosis. 3. Severe colonic diverticulosis, without with acute inflammatory changes to suggest an acute diverticulitis at this time. Electronically Signed   By: Vinnie Langton M.D.   On: 01/23/2017 09:51    Assessment & Plan:   There are no diagnoses linked to this encounter. I have discontinued Anabeth C. Putman's Cholecalciferol and ciprofloxacin. I am also having her maintain her aspirin, vitamin C, Fish Oil, Magnesium, triamterene-hydrochlorothiazide, benazepril, Turmeric, and Calcium Carbonate-Vitamin D3.  No orders of the defined types were placed in this encounter.    Follow-up: No follow-ups on file.  Walker Kehr, MD

## 2018-02-06 NOTE — Assessment & Plan Note (Signed)
cbc

## 2018-02-07 ENCOUNTER — Other Ambulatory Visit (INDEPENDENT_AMBULATORY_CARE_PROVIDER_SITE_OTHER): Payer: Medicare Other

## 2018-02-07 DIAGNOSIS — E559 Vitamin D deficiency, unspecified: Secondary | ICD-10-CM

## 2018-02-07 DIAGNOSIS — E785 Hyperlipidemia, unspecified: Secondary | ICD-10-CM

## 2018-02-07 DIAGNOSIS — R5382 Chronic fatigue, unspecified: Secondary | ICD-10-CM | POA: Diagnosis not present

## 2018-02-07 DIAGNOSIS — I10 Essential (primary) hypertension: Secondary | ICD-10-CM

## 2018-02-07 DIAGNOSIS — R202 Paresthesia of skin: Secondary | ICD-10-CM

## 2018-02-07 LAB — BASIC METABOLIC PANEL
BUN: 21 mg/dL (ref 6–23)
CO2: 25 mEq/L (ref 19–32)
CREATININE: 0.89 mg/dL (ref 0.40–1.20)
Calcium: 9.3 mg/dL (ref 8.4–10.5)
Chloride: 97 mEq/L (ref 96–112)
GFR: 66.36 mL/min (ref >60.00)
GLUCOSE: 107 mg/dL — AB (ref 70–99)
POTASSIUM: 4.1 meq/L (ref 3.5–5.1)
Sodium: 132 mEq/L — ABNORMAL LOW (ref 135–145)

## 2018-02-07 LAB — URINALYSIS, ROUTINE W REFLEX MICROSCOPIC
Bilirubin Urine: NEGATIVE
HGB URINE DIPSTICK: NEGATIVE
KETONES UR: NEGATIVE
Nitrite: NEGATIVE
RBC / HPF: NONE SEEN (ref 0–?)
SPECIFIC GRAVITY, URINE: 1.015 (ref 1.000–1.030)
Total Protein, Urine: NEGATIVE
Urine Glucose: NEGATIVE
Urobilinogen, UA: 0.2 (ref 0.0–1.0)
pH: 7.5 (ref 5.0–8.0)

## 2018-02-07 LAB — CBC WITH DIFFERENTIAL/PLATELET
Basophils Absolute: 0.1 10*3/uL (ref 0.0–0.1)
Basophils Relative: 0.7 % (ref 0.0–3.0)
EOS ABS: 0.3 10*3/uL (ref 0.0–0.7)
EOS PCT: 3.8 % (ref 0.0–5.0)
HEMATOCRIT: 37.9 % (ref 36.0–46.0)
HEMOGLOBIN: 12.7 g/dL (ref 12.0–15.0)
LYMPHS PCT: 29.2 % (ref 12.0–46.0)
Lymphs Abs: 2.2 10*3/uL (ref 0.7–4.0)
MCHC: 33.6 g/dL (ref 30.0–36.0)
MCV: 90.8 fl (ref 78.0–100.0)
MONO ABS: 0.8 10*3/uL (ref 0.1–1.0)
Monocytes Relative: 10 % (ref 3.0–12.0)
Neutro Abs: 4.3 10*3/uL (ref 1.4–7.7)
Neutrophils Relative %: 56.3 % (ref 43.0–77.0)
Platelets: 266 10*3/uL (ref 150.0–400.0)
RBC: 4.18 Mil/uL (ref 3.87–5.11)
RDW: 13.3 % (ref 11.5–15.5)
WBC: 7.7 10*3/uL (ref 4.0–10.5)

## 2018-02-07 LAB — VITAMIN D 25 HYDROXY (VIT D DEFICIENCY, FRACTURES): VITD: 42.29 ng/mL (ref 30.00–100.00)

## 2018-02-07 LAB — LIPID PANEL
CHOLESTEROL: 147 mg/dL (ref 0–200)
HDL: 44.3 mg/dL (ref >39.00)
LDL Cholesterol: 89 mg/dL (ref 0–99)
NONHDL: 102.75
Total CHOL/HDL Ratio: 3
Triglycerides: 70 mg/dL (ref 0.0–149.0)
VLDL: 14 mg/dL (ref 0.0–40.0)

## 2018-02-07 LAB — HEPATIC FUNCTION PANEL
ALK PHOS: 55 U/L (ref 39–117)
ALT: 29 U/L (ref 0–35)
AST: 28 U/L (ref 0–37)
Albumin: 4.1 g/dL (ref 3.5–5.2)
BILIRUBIN DIRECT: 0.2 mg/dL (ref 0.0–0.3)
BILIRUBIN TOTAL: 0.6 mg/dL (ref 0.2–1.2)
TOTAL PROTEIN: 7 g/dL (ref 6.0–8.3)

## 2018-02-07 LAB — TSH: TSH: 3.88 u[IU]/mL (ref 0.35–4.50)

## 2018-02-07 LAB — VITAMIN B12: Vitamin B-12: 338 pg/mL (ref 211–911)

## 2018-02-12 ENCOUNTER — Ambulatory Visit (INDEPENDENT_AMBULATORY_CARE_PROVIDER_SITE_OTHER): Payer: Medicare Other | Admitting: *Deleted

## 2018-02-12 VITALS — BP 142/68 | HR 80 | Resp 18 | Ht 60.0 in | Wt 169.0 lb

## 2018-02-12 DIAGNOSIS — Z0000 Encounter for general adult medical examination without abnormal findings: Secondary | ICD-10-CM

## 2018-02-12 NOTE — Patient Instructions (Addendum)
Continue doing brain stimulating activities (puzzles, reading, adult coloring books, staying active) to keep memory sharp.   Continue to eat heart healthy diet (full of fruits, vegetables, whole grains, lean protein, water--limit salt, fat, and sugar intake) and increase physical activity as tolerated.   Alexandra Scott , Thank you for taking time to come for your Medicare Wellness Visit. I appreciate your ongoing commitment to your health goals. Please review the following plan we discussed and let me know if I can assist you in the future.   These are the goals we discussed: Goals    . Patient Stated     I will continue to keep water at my finger tips at all times and set certain times to stop and drink a glass of water. I want to do my best to take my vitamins everyday.       This is a list of the screening recommended for you and due dates:  Health Maintenance  Topic Date Due  .  Hepatitis C: One time screening is recommended by Center for Disease Control  (CDC) for  adults born from [birth year] through 1965.   Jun 04, 1946  . Mammogram  05/12/2017  . Flu Shot  04/26/2018  . Colon Cancer Screening  07/07/2020  . Tetanus Vaccine  11/19/2020  . DEXA scan (bone density measurement)  Completed  . Pneumonia vaccines  Completed   Health Maintenance, Female Adopting a healthy lifestyle and getting preventive care can go a long way to promote health and wellness. Talk with your health care provider about what schedule of regular examinations is right for you. This is a good chance for you to check in with your provider about disease prevention and staying healthy. In between checkups, there are plenty of things you can do on your own. Experts have done a lot of research about which lifestyle changes and preventive measures are most likely to keep you healthy. Ask your health care provider for more information. Weight and diet Eat a healthy diet  Be sure to include plenty of vegetables, fruits, low-fat  dairy products, and lean protein.  Do not eat a lot of foods high in solid fats, added sugars, or salt.  Get regular exercise. This is one of the most important things you can do for your health. ? Most adults should exercise for at least 150 minutes each week. The exercise should increase your heart rate and make you sweat (moderate-intensity exercise). ? Most adults should also do strengthening exercises at least twice a week. This is in addition to the moderate-intensity exercise.  Maintain a healthy weight  Body mass index (BMI) is a measurement that can be used to identify possible weight problems. It estimates body fat based on height and weight. Your health care provider can help determine your BMI and help you achieve or maintain a healthy weight.  For females 61 years of age and older: ? A BMI below 18.5 is considered underweight. ? A BMI of 18.5 to 24.9 is normal. ? A BMI of 25 to 29.9 is considered overweight. ? A BMI of 30 and above is considered obese.  Watch levels of cholesterol and blood lipids  You should start having your blood tested for lipids and cholesterol at 72 years of age, then have this test every 5 years.  You may need to have your cholesterol levels checked more often if: ? Your lipid or cholesterol levels are high. ? You are older than 72 years of age. ? You  are at high risk for heart disease.  Cancer screening Lung Cancer  Lung cancer screening is recommended for adults 17-34 years old who are at high risk for lung cancer because of a history of smoking.  A yearly low-dose CT scan of the lungs is recommended for people who: ? Currently smoke. ? Have quit within the past 15 years. ? Have at least a 30-pack-year history of smoking. A pack year is smoking an average of one pack of cigarettes a day for 1 year.  Yearly screening should continue until it has been 15 years since you quit.  Yearly screening should stop if you develop a health problem that  would prevent you from having lung cancer treatment.  Breast Cancer  Practice breast self-awareness. This means understanding how your breasts normally appear and feel.  It also means doing regular breast self-exams. Let your health care provider know about any changes, no matter how small.  If you are in your 20s or 30s, you should have a clinical breast exam (CBE) by a health care provider every 1-3 years as part of a regular health exam.  If you are 48 or older, have a CBE every year. Also consider having a breast X-ray (mammogram) every year.  If you have a family history of breast cancer, talk to your health care provider about genetic screening.  If you are at high risk for breast cancer, talk to your health care provider about having an MRI and a mammogram every year.  Breast cancer gene (BRCA) assessment is recommended for women who have family members with BRCA-related cancers. BRCA-related cancers include: ? Breast. ? Ovarian. ? Tubal. ? Peritoneal cancers.  Results of the assessment will determine the need for genetic counseling and BRCA1 and BRCA2 testing.  Cervical Cancer Your health care provider may recommend that you be screened regularly for cancer of the pelvic organs (ovaries, uterus, and vagina). This screening involves a pelvic examination, including checking for microscopic changes to the surface of your cervix (Pap test). You may be encouraged to have this screening done every 3 years, beginning at age 70.  For women ages 53-65, health care providers may recommend pelvic exams and Pap testing every 3 years, or they may recommend the Pap and pelvic exam, combined with testing for human papilloma virus (HPV), every 5 years. Some types of HPV increase your risk of cervical cancer. Testing for HPV may also be done on women of any age with unclear Pap test results.  Other health care providers may not recommend any screening for nonpregnant women who are considered low  risk for pelvic cancer and who do not have symptoms. Ask your health care provider if a screening pelvic exam is right for you.  If you have had past treatment for cervical cancer or a condition that could lead to cancer, you need Pap tests and screening for cancer for at least 20 years after your treatment. If Pap tests have been discontinued, your risk factors (such as having a new sexual partner) need to be reassessed to determine if screening should resume. Some women have medical problems that increase the chance of getting cervical cancer. In these cases, your health care provider may recommend more frequent screening and Pap tests.  Colorectal Cancer  This type of cancer can be detected and often prevented.  Routine colorectal cancer screening usually begins at 72 years of age and continues through 72 years of age.  Your health care provider may recommend screening at  an earlier age if you have risk factors for colon cancer.  Your health care provider may also recommend using home test kits to check for hidden blood in the stool.  A small camera at the end of a tube can be used to examine your colon directly (sigmoidoscopy or colonoscopy). This is done to check for the earliest forms of colorectal cancer.  Routine screening usually begins at age 21.  Direct examination of the colon should be repeated every 5-10 years through 72 years of age. However, you may need to be screened more often if early forms of precancerous polyps or small growths are found.  Skin Cancer  Check your skin from head to toe regularly.  Tell your health care provider about any new moles or changes in moles, especially if there is a change in a mole's shape or color.  Also tell your health care provider if you have a mole that is larger than the size of a pencil eraser.  Always use sunscreen. Apply sunscreen liberally and repeatedly throughout the day.  Protect yourself by wearing long sleeves, pants, a  wide-brimmed hat, and sunglasses whenever you are outside.  Heart disease, diabetes, and high blood pressure  High blood pressure causes heart disease and increases the risk of stroke. High blood pressure is more likely to develop in: ? People who have blood pressure in the high end of the normal range (130-139/85-89 mm Hg). ? People who are overweight or obese. ? People who are African American.  If you are 11-80 years of age, have your blood pressure checked every 3-5 years. If you are 72 years of age or older, have your blood pressure checked every year. You should have your blood pressure measured twice-once when you are at a hospital or clinic, and once when you are not at a hospital or clinic. Record the average of the two measurements. To check your blood pressure when you are not at a hospital or clinic, you can use: ? An automated blood pressure machine at a pharmacy. ? A home blood pressure monitor.  If you are between 90 years and 49 years old, ask your health care provider if you should take aspirin to prevent strokes.  Have regular diabetes screenings. This involves taking a blood sample to check your fasting blood sugar level. ? If you are at a normal weight and have a low risk for diabetes, have this test once every three years after 72 years of age. ? If you are overweight and have a high risk for diabetes, consider being tested at a younger age or more often. Preventing infection Hepatitis B  If you have a higher risk for hepatitis B, you should be screened for this virus. You are considered at high risk for hepatitis B if: ? You were born in a country where hepatitis B is common. Ask your health care provider which countries are considered high risk. ? Your parents were born in a high-risk country, and you have not been immunized against hepatitis B (hepatitis B vaccine). ? You have HIV or AIDS. ? You use needles to inject street drugs. ? You live with someone who has  hepatitis B. ? You have had sex with someone who has hepatitis B. ? You get hemodialysis treatment. ? You take certain medicines for conditions, including cancer, organ transplantation, and autoimmune conditions.  Hepatitis C  Blood testing is recommended for: ? Everyone born from [birth year] through 1965. ? Anyone with known risk factors for  hepatitis C.  Sexually transmitted infections (STIs)  You should be screened for sexually transmitted infections (STIs) including gonorrhea and chlamydia if: ? You are sexually active and are younger than 71 years of age. ? You are older than 72 years of age and your health care provider tells you that you are at risk for this type of infection. ? Your sexual activity has changed since you were last screened and you are at an increased risk for chlamydia or gonorrhea. Ask your health care provider if you are at risk.  If you do not have HIV, but are at risk, it may be recommended that you take a prescription medicine daily to prevent HIV infection. This is called pre-exposure prophylaxis (PrEP). You are considered at risk if: ? You are sexually active and do not regularly use condoms or know the HIV status of your partner(s). ? You take drugs by injection. ? You are sexually active with a partner who has HIV.  Talk with your health care provider about whether you are at high risk of being infected with HIV. If you choose to begin PrEP, you should first be tested for HIV. You should then be tested every 3 months for as long as you are taking PrEP. Pregnancy  If you are premenopausal and you may become pregnant, ask your health care provider about preconception counseling.  If you may become pregnant, take 400 to 800 micrograms (mcg) of folic acid every day.  If you want to prevent pregnancy, talk to your health care provider about birth control (contraception). Osteoporosis and menopause  Osteoporosis is a disease in which the bones lose minerals and  strength with aging. This can result in serious bone fractures. Your risk for osteoporosis can be identified using a bone density scan.  If you are 52 years of age or older, or if you are at risk for osteoporosis and fractures, ask your health care provider if you should be screened.  Ask your health care provider whether you should take a calcium or vitamin D supplement to lower your risk for osteoporosis.  Menopause may have certain physical symptoms and risks.  Hormone replacement therapy may reduce some of these symptoms and risks. Talk to your health care provider about whether hormone replacement therapy is right for you. Follow these instructions at home:  Schedule regular health, dental, and eye exams.  Stay current with your immunizations.  Do not use any tobacco products including cigarettes, chewing tobacco, or electronic cigarettes.  If you are pregnant, do not drink alcohol.  If you are breastfeeding, limit how much and how often you drink alcohol.  Limit alcohol intake to no more than 1 drink per day for nonpregnant women. One drink equals 12 ounces of beer, 5 ounces of Margee Trentham, or 1 ounces of hard liquor.  Do not use street drugs.  Do not share needles.  Ask your health care provider for help if you need support or information about quitting drugs.  Tell your health care provider if you often feel depressed.  Tell your health care provider if you have ever been abused or do not feel safe at home. This information is not intended to replace advice given to you by your health care provider. Make sure you discuss any questions you have with your health care provider. Document Released: 03/28/2011 Document Revised: 02/18/2016 Document Reviewed: 06/16/2015 Elsevier Interactive Patient Education  [employer]. It is important to avoid accidents which may result in broken bones.  Here  are a few ideas on how to make your home safer so you will be less likely to trip or  fall.  1. Use nonskid mats or non slip strips in your shower or tub, on your bathroom floor and around sinks.  If you know that you have spilled water, wipe it up! 2. In the bathroom, it is important to have properly installed grab bars on the walls or on the edge of the tub.  Towel racks are NOT strong enough for you to hold onto or to pull on for support. 3. Stairs and hallways should have enough light.  Add lamps or night lights if you need ore light. 4. It is good to have handrails on both sides of the stairs if possible.  Always fix broken handrails right away. 5. It is important to see the edges of steps.  Paint the edges of outdoor steps white so you can see them better.  Put colored tape on the edge of inside steps. 6. Throw-rugs are dangerous because they can slide.  Removing the rugs is the best idea, but if they must stay, add adhesive carpet tape to prevent slipping. 7. Do not keep things on stairs or in the halls.  Remove small furniture that blocks the halls as it may cause you to trip.  Keep telephone and electrical cords out of the way where you walk. 8. Always were sturdy, rubber-soled shoes for good support.  Never wear just socks, especially on the stairs.  Socks may cause you to slip or fall.  Do not wear full-length housecoats as you can easily trip on the bottom.  9. Place the things you use the most on the shelves that are the easiest to reach.  If you use a stepstool, make sure it is in good condition.  If you feel unsteady, DO NOT climb, ask for help. 10. If a health professional advises you to use a cane or walker, do not be ashamed.  These items can keep you from falling and breaking your bones.

## 2018-02-12 NOTE — Progress Notes (Addendum)
Subjective:   Alexandra Scott is a 72 y.o. female who presents for Medicare Annual (Subsequent) preventive examination.  Review of Systems:  No ROS.  Medicare Wellness Visit. Additional risk factors are reflected in the social history.  Cardiac Risk Factors include: advanced age (>66men, >89 women);hypertension Sleep patterns: does not get up to void, gets up 1 times nightly to void and sleeps 7-8 hours nightly.    Home Safety/Smoke Alarms: Feels safe in home. Smoke alarms in place.  Living environment; residence and Firearm Safety: 1-story house/ trailer, no firearms. Lives with husband, no needs for DME, good support system Seat Belt Safety/Bike Helmet: Wears seat belt.     Objective:     Vitals: BP (!) 142/68   Pulse 80   Resp 18   Ht 5' (1.524 m)   Wt 169 lb (76.7 kg)   SpO2 100%   BMI 33.01 kg/m   Body mass index is 33.01 kg/m.  Advanced Directives 02/12/2018 02/06/2017 06/16/2015 05/21/2014  Does Patient Have a Medical Advance Directive? No No No No  Would patient like information on creating a medical advance directive? No - Patient declined Yes (ED - Information included in AVS) - -    Tobacco Social History   Tobacco Use  Smoking Status Never Smoker  Smokeless Tobacco Never Used     Counseling given: Not Answered   Past Medical History:  Diagnosis Date  . Breast cyst 2008   Left / SER  . Colon polyps   . Constipation   . GERD (gastroesophageal reflux disease)    uses OTC  . HTN (hypertension)   . LBP (low back pain)    ? Spinal stenosis  . Osteoarthritis   . Osteoarthritis of hip 2011   Right/Dr Fields   Past Surgical History:  Procedure Laterality Date  . BREAST DUCTAL SYSTEM EXCISION Left 05/22/2014   Procedure: LEFT NIPPLE DUCT EXCISION;  Surgeon: Imogene Burn. Georgette Dover, MD;  Location: [facility];  Service: General;  Laterality: Left;  . COLONOSCOPY W/ BIOPSIES AND POLYPECTOMY    . JOINT REPLACEMENT  7/12   R THR   Family History  Problem Relation  Age of Onset  . Colon cancer Maternal Uncle   . Hypertension Other   . Breast cancer Other    Social History   Socioeconomic History  . Marital status: Married    Spouse name: Not on file  . Number of children: Not on file  . Years of education: Not on file  . Highest education level: Not on file  Occupational History  . Occupation: Museum/gallery conservator: self employed  Social Needs  . Financial resource strain: Not hard at all  . Food insecurity:    Worry: Never true    Inability: Never true  . Transportation needs:    Medical: No    Non-medical: No  Tobacco Use  . Smoking status: Never Smoker  . Smokeless tobacco: Never Used  Substance and Sexual Activity  . Alcohol use: No    Alcohol/week: 0.0 oz  . Drug use: No  . Sexual activity: Not Currently  Lifestyle  . Physical activity:    Days per week: 3 days    Minutes per session: 50 min  . Stress: Rather much  Relationships  . Social connections:    Talks on phone: More than three times a week    Gets together: More than three times a week    Attends religious service: More than 4  times per year    Active member of club or organization: Yes    Attends meetings of clubs or organizations: More than 4 times per year    Relationship status: Married  Other Topics Concern  . Not on file  Social History Narrative  . Not on file    Outpatient Encounter Medications as of 02/12/2018  Medication Sig  . aspirin 81 MG EC tablet Take 81 mg by mouth daily.    . benazepril (LOTENSIN) 40 MG tablet TAKE 1 TABLET BY MOUTH ONCE DAILY.  . Calcium Carbonate-Vitamin D3 (CALCIUM 600/VITAMIN D) 600-400 MG-UNIT TABS Take by mouth.  . Magnesium 250 MG TABS Take by mouth daily.  . Omega-3 Fatty Acids (FISH OIL) 1000 MG CAPS Take by mouth daily. Reported on 01/11/2016  . triamterene-hydrochlorothiazide (MAXZIDE-25) 37.5-25 MG tablet Take 1 tablet by mouth daily.  . Turmeric 500 MG TABS Take by mouth 2 (two) times daily.  . vitamin C  (ASCORBIC ACID) 500 MG tablet Take 500 mg by mouth daily.   No facility-administered encounter medications on file as of 02/12/2018.     Activities of Daily Living In your present state of health, do you have any difficulty performing the following activities: 02/12/2018  Hearing? N  Vision? N  Difficulty concentrating or making decisions? N  Walking or climbing stairs? N  Dressing or bathing? N  Doing errands, shopping? N  Preparing Food and eating ? N  Using the Toilet? N  In the past six months, have you accidently leaked urine? N  Do you have problems with loss of bowel control? N  Managing your Medications? N  Managing your Finances? N  Housekeeping or managing your Housekeeping? N  Some recent data might be hidden    Patient Care Team: Plotnikov, Evie Lacks, MD as PCP - General Barbaraann Rondo, MD (Obstetrics and Gynecology) Ladene Artist, MD (Gastroenterology) Gaynelle Arabian, MD (Orthopedic Surgery)    Assessment:   This is a routine wellness examination for Alexandra Scott. Physical assessment deferred to PCP.   Exercise Activities and Dietary recommendations Current Exercise Habits: Home exercise routine;Structured exercise class, Type of exercise: stretching;calisthenics;treadmill, Time (Minutes): 30, Frequency (Times/Week): 4, Weekly Exercise (Minutes/Week): 120, Exercise limited by: orthopedic condition(s)  Diet (meal preparation, eat out, water intake, caffeinated beverages, dairy products, fruits and vegetables): in general, a "healthy" diet  , well balanced   Reviewed heart healthy and diabetic diet, encouraged patient to increase daily water intake.   Goals    . Patient Stated     I will continue to keep water at my finger tips at all times and set certain times to stop and drink a glass of water. I want to do my best to take my vitamins everyday.       Fall Risk Fall Risk  02/12/2018 02/06/2017 06/30/2015  Falls in the past year? Yes No No  Number falls in  past yr: 1 - -  Injury with Fall? No - -  Follow up Falls prevention discussed - -    Depression Screen PHQ 2/9 Scores 02/12/2018 02/06/2017 06/30/2015  PHQ - 2 Score 1 0 0  PHQ- 9 Score 2 - -     Cognitive Function       Ad8 score reviewed for issues:  Issues making decisions: no  Less interest in hobbies / activities: no  Repeats questions, stories (family complaining): no  Trouble using ordinary gadgets (microwave, computer, phone):no  Forgets the month or year: no  Mismanaging finances:  no  Remembering appts: no  Daily problems with thinking and/or memory: no Ad8 score is= 0  Immunization History  Administered Date(s) Administered  . Influenza,inj,Quad PF,6+ Mos 06/30/2014, 06/30/2015  . Pneumococcal Conjugate-13 01/11/2016  . Pneumococcal Polysaccharide-23 02/06/2018  . Td 11/19/2010   Screening Tests Health Maintenance  Topic Date Due  . Hepatitis C Screening  16-Oct-1945  . MAMMOGRAM  05/12/2017  . INFLUENZA VACCINE  04/26/2018  . COLONOSCOPY  07/07/2020  . TETANUS/TDAP  11/19/2020  . DEXA SCAN  Completed  . PNA vac Low Risk Adult  Completed      Plan:     Continue doing brain stimulating activities (puzzles, reading, adult coloring books, staying active) to keep memory sharp.   Continue to eat heart healthy diet (full of fruits, vegetables, whole grains, lean protein, water--limit salt, fat, and sugar intake) and increase physical activity as tolerated.  I have personally reviewed and noted the following in the patient's chart:   . Medical and social history . Use of alcohol, tobacco or illicit drugs  . Current medications and supplements . Functional ability and status . Nutritional status . Physical activity . Advanced directives . List of other physicians . Vitals . Screenings to include cognitive, depression, and falls . Referrals and appointments  In addition, I have reviewed and discussed with patient certain preventive protocols,  quality metrics, and best practice recommendations. A written personalized care plan for preventive services as well as general preventive health recommendations were provided to patient.     Michiel Cowboy, RN  02/12/2018   Medical screening examination/treatment/procedure(s) were performed by non-physician practitioner and as supervising physician I was immediately available for consultation/collaboration. I agree with above. Lew Dawes, MD

## 2018-02-21 ENCOUNTER — Telehealth: Payer: Self-pay

## 2018-02-21 NOTE — Telephone Encounter (Signed)
Pt returned call - please call [phone]

## 2018-02-21 NOTE — Telephone Encounter (Signed)
LMTCB  Copied from [place] [id number]. Topic: Quick Communication - Lab Results >> Feb 21, 2018 10:12 AM Synthia Innocent wrote: Requesting lab results, also would like a copy mailed to her

## 2018-02-22 NOTE — Telephone Encounter (Signed)
Pt notified and labs mailed 

## 2018-05-15 DIAGNOSIS — M7541 Impingement syndrome of right shoulder: Secondary | ICD-10-CM | POA: Diagnosis not present

## 2018-05-15 DIAGNOSIS — M25511 Pain in right shoulder: Secondary | ICD-10-CM | POA: Diagnosis not present

## 2018-05-22 DIAGNOSIS — Z6831 Body mass index (BMI) 31.0-31.9, adult: Secondary | ICD-10-CM | POA: Diagnosis not present

## 2018-05-22 DIAGNOSIS — M25519 Pain in unspecified shoulder: Secondary | ICD-10-CM | POA: Diagnosis not present

## 2018-05-22 DIAGNOSIS — N390 Urinary tract infection, site not specified: Secondary | ICD-10-CM | POA: Diagnosis not present

## 2018-05-23 ENCOUNTER — Encounter: Payer: Self-pay | Admitting: Internal Medicine

## 2018-05-23 NOTE — Progress Notes (Signed)
Abstracted and sent to scan  

## 2018-05-30 DIAGNOSIS — M7541 Impingement syndrome of right shoulder: Secondary | ICD-10-CM | POA: Diagnosis not present

## 2018-05-30 DIAGNOSIS — M25511 Pain in right shoulder: Secondary | ICD-10-CM | POA: Diagnosis not present

## 2018-06-07 DIAGNOSIS — H02831 Dermatochalasis of right upper eyelid: Secondary | ICD-10-CM | POA: Diagnosis not present

## 2018-06-07 DIAGNOSIS — H01021 Squamous blepharitis right upper eyelid: Secondary | ICD-10-CM | POA: Diagnosis not present

## 2018-06-07 DIAGNOSIS — H01022 Squamous blepharitis right lower eyelid: Secondary | ICD-10-CM | POA: Diagnosis not present

## 2018-06-07 DIAGNOSIS — H04123 Dry eye syndrome of bilateral lacrimal glands: Secondary | ICD-10-CM | POA: Diagnosis not present

## 2018-06-07 DIAGNOSIS — H01025 Squamous blepharitis left lower eyelid: Secondary | ICD-10-CM | POA: Diagnosis not present

## 2018-06-07 DIAGNOSIS — H02834 Dermatochalasis of left upper eyelid: Secondary | ICD-10-CM | POA: Diagnosis not present

## 2018-06-07 DIAGNOSIS — H01024 Squamous blepharitis left upper eyelid: Secondary | ICD-10-CM | POA: Diagnosis not present

## 2018-06-07 DIAGNOSIS — H25813 Combined forms of age-related cataract, bilateral: Secondary | ICD-10-CM | POA: Diagnosis not present

## 2018-08-03 DIAGNOSIS — M7061 Trochanteric bursitis, right hip: Secondary | ICD-10-CM | POA: Diagnosis not present

## 2018-08-03 DIAGNOSIS — M25561 Pain in right knee: Secondary | ICD-10-CM | POA: Diagnosis not present

## 2018-08-03 DIAGNOSIS — M1711 Unilateral primary osteoarthritis, right knee: Secondary | ICD-10-CM | POA: Diagnosis not present

## 2018-08-03 DIAGNOSIS — Z96641 Presence of right artificial hip joint: Secondary | ICD-10-CM | POA: Diagnosis not present

## 2018-09-13 DIAGNOSIS — M1711 Unilateral primary osteoarthritis, right knee: Secondary | ICD-10-CM | POA: Insufficient documentation

## 2018-09-14 DIAGNOSIS — M7061 Trochanteric bursitis, right hip: Secondary | ICD-10-CM | POA: Diagnosis not present

## 2018-09-14 DIAGNOSIS — M1711 Unilateral primary osteoarthritis, right knee: Secondary | ICD-10-CM | POA: Diagnosis not present

## 2018-10-03 ENCOUNTER — Ambulatory Visit (INDEPENDENT_AMBULATORY_CARE_PROVIDER_SITE_OTHER)
Admission: RE | Admit: 2018-10-03 | Discharge: 2018-10-03 | Disposition: A | Discharge status: Home / Self Care | Payer: PPO | Source: Ambulatory Visit | Attending: Family | Admitting: Family

## 2018-10-03 ENCOUNTER — Encounter: Payer: Self-pay | Admitting: Family

## 2018-10-03 ENCOUNTER — Ambulatory Visit (INDEPENDENT_AMBULATORY_CARE_PROVIDER_SITE_OTHER): Payer: PPO | Admitting: Family

## 2018-10-03 VITALS — BP 148/60 | HR 90 | Temp 98.3°F | Ht 60.0 in | Wt 159.1 lb

## 2018-10-03 DIAGNOSIS — J209 Acute bronchitis, unspecified: Secondary | ICD-10-CM | POA: Diagnosis not present

## 2018-10-03 DIAGNOSIS — R0602 Shortness of breath: Secondary | ICD-10-CM | POA: Diagnosis not present

## 2018-10-03 DIAGNOSIS — B370 Candidal stomatitis: Secondary | ICD-10-CM

## 2018-10-03 DIAGNOSIS — R05 Cough: Secondary | ICD-10-CM

## 2018-10-03 DIAGNOSIS — R6889 Other general symptoms and signs: Secondary | ICD-10-CM | POA: Diagnosis not present

## 2018-10-03 DIAGNOSIS — R059 Cough, unspecified: Secondary | ICD-10-CM

## 2018-10-03 MED ORDER — NYSTATIN 100000 UNIT/ML MT SUSP: 5.0000 mL | Freq: Four times a day (QID) | OROMUCOSAL | 0 refills | Status: DC

## 2018-10-03 MED ORDER — HYDROCODONE-HOMATROPINE 5-1.5 MG/5ML PO SYRP: 5.0000 mL | ORAL_SOLUTION | Freq: Three times a day (TID) | ORAL | 0 refills | Status: DC | PRN

## 2018-10-03 MED ORDER — IPRATROPIUM-ALBUTEROL 0.5-2.5 (3) MG/3ML IN SOLN
3.0000 mL | Freq: Once | RESPIRATORY_TRACT | Status: AC
  Administered 2018-10-03: 3 mL via RESPIRATORY_TRACT

## 2018-10-03 MED ORDER — AMOXICILLIN-POT CLAVULANATE 875-125 MG PO TABS: 1.0000 | ORAL_TABLET | Freq: Two times a day (BID) | ORAL | 0 refills | Status: DC

## 2018-10-03 NOTE — Progress Notes (Signed)
Alexandra Scott is a 73 y.o. female with the following history as recorded in EpicCare:  Patient Active Problem List   Diagnosis Date Noted  . Callus of foot 03/07/2017  . Leg pain, lateral, right 02/06/2017  . Stress at home 02/06/2017  . Abdominal pain in female 01/10/2017  . Osteoporosis, post-menopausal 01/10/2017  . Sore in nose 01/10/2017  . GERD (gastroesophageal reflux disease) 01/10/2017  . Cystitis 01/13/2016  . Cough 01/11/2016  . Acute upper respiratory infection 09/25/2015  . Lightheadedness 06/30/2015  . Discharge from left nipple 05/06/2014  . Right shoulder pain 11/22/2013  . Fatigue 11/22/2013  . Cellulitis and abscess of buttock 01/31/2013  . Actinic keratoses 07/01/2012  . Well adult exam 05/18/2012  . Anemia 05/18/2012  . Neoplasm of uncertain behavior of skin 05/18/2012  . OSTEOARTHRITIS 08/18/2010  . DEGENERATIVE JOINT DISEASE, RIGHT HIP 07/01/2010  . HIP PAIN 04/21/2010  . LOW BACK PAIN 02/05/2010  . CONSTIPATION 10/24/2008  . RLQ PAIN 10/24/2008  . Essential hypertension 10/24/2007  . BREAST CYST 10/24/2007  . FOOT PAIN 10/24/2007  . COLONIC POLYPS, HX OF 10/24/2007    Current Outpatient Medications  Medication Sig Dispense Refill  . aspirin 81 MG EC tablet Take 81 mg by mouth daily.      . benazepril (LOTENSIN) 40 MG tablet TAKE 1 TABLET BY MOUTH ONCE DAILY. 30 tablet 11  . Calcium Carbonate-Vitamin D3 (CALCIUM 600/VITAMIN D) 600-400 MG-UNIT TABS Take by mouth.    . Magnesium 250 MG TABS Take by mouth daily.    . Omega-3 Fatty Acids (FISH OIL) 1000 MG CAPS Take by mouth daily. Reported on 01/11/2016    . triamterene-hydrochlorothiazide (MAXZIDE-25) 37.5-25 MG tablet Take 1 tablet by mouth daily. 30 tablet 11  . Turmeric 500 MG TABS Take by mouth 2 (two) times daily.    . vitamin C (ASCORBIC ACID) 500 MG tablet Take 500 mg by mouth daily.    Marland Kitchen amoxicillin-clavulanate (AUGMENTIN) 875-125 MG tablet Take 1 tablet by mouth 2 (two) times daily. 20  tablet 0  . HYDROcodone-homatropine (HYCODAN) 5-1.5 MG/5ML syrup Take 5 mLs by mouth every 8 (eight) hours as needed for cough. 120 mL 0  . nystatin (MYCOSTATIN) 100000 UNIT/ML suspension Take 5 mLs (500,000 Units total) by mouth 4 (four) times daily. Gargle and spit for thrush 120 mL 0   No current facility-administered medications for this visit.     Allergies: Cortisone and Prednisone  Past Medical History:  Diagnosis Date  . Breast cyst 2008   Left / SER  . Colon polyps   . Constipation   . GERD (gastroesophageal reflux disease)    uses OTC  . HTN (hypertension)   . LBP (low back pain)    ? Spinal stenosis  . Osteoarthritis   . Osteoarthritis of hip 2011   Right/Dr Fields    Past Surgical History:  Procedure Laterality Date  . BREAST DUCTAL SYSTEM EXCISION Left 05/22/2014   Procedure: LEFT NIPPLE DUCT EXCISION;  Surgeon: Imogene Burn. Georgette Dover, MD;  Location: [facility];  Service: General;  Laterality: Left;  . COLONOSCOPY W/ BIOPSIES AND POLYPECTOMY    . JOINT REPLACEMENT  7/12   R THR    Family History  Problem Relation Age of Onset  . Colon cancer Maternal Uncle   . Hypertension Other   . Breast cancer Other     Social History   Tobacco Use  . Smoking status: Never Smoker  . Smokeless tobacco: Never Used  Substance Use  Topics  . Alcohol use: No    Alcohol/week: 0.0 standard drinks    Subjective:  Patient presents with 5 day history of cough/ congestion; + thick mucus; + headache; describes as "violent cough."  Notes that started suddenly on Friday "just feeling bad" and symptoms have progressively worsened; no chest pain or shortness of breath; denies any fever; did take flu shot this year; not prone to bronchitis or pneumonia; unfortunately, she cannot tolerate prednisone. Tried OTC Mucinex; cough is keeping awake at night;    Objective:  Vitals:   10/03/18 1010  BP: (!) 148/60  Pulse: 90  Temp: 98.3 F (36.8 C)  TempSrc: Oral  SpO2: 97%  Weight: 159 lb 1.3 oz  (72.2 kg)  Height: 5' (1.524 m)    General: Well developed, well nourished, in no acute distress  Skin : Warm and dry.  Head: Normocephalic and atraumatic  Eyes: Sclera and conjunctiva clear; pupils round and reactive to light; extraocular movements intact  Ears: External normal; canals clear; tympanic membranes erythematous Oropharynx: Pink, supple. No suspicious lesions; thrush noted on tongue  Neck: Supple without thyromegaly, adenopathy  Lungs: Respirations unlabored; coarse breath sounds noted- limited improvement after nebulizer treatment given CVS exam: normal rate and regular rhythm.  Abdomen: Soft; nontender; nondistended; normoactive bowel sounds; no masses or hepatosplenomegaly  Musculoskeletal: No deformities; no active joint inflammation  Extremities: No edema, cyanosis, clubbing  Vessels: Symmetric bilaterally  Neurologic: Alert and oriented; speech intact; face symmetrical; moves all extremities well; CNII-XII intact without focal deficit   Assessment:  1. Flu-like symptoms   2. Cough   3. Acute bronchitis, unspecified organism   4. Oral thrush     Plan:  Suspect flu with now secondary bronchitis/ sinusitis; nebulizer treatment given with limited relief; will update CXR due to severity of cough; Rx for Doxycycline 100 mg bid x 10 days, Hycodan cough syrup 1 tsp po q 6-8 hours; patient prefers not to use any type of steroid at this time. Will also treat for suspected thrush noted on tongue with oral Mycostatin.   Increase fluids, rest- follow-up to be determined based on CXR results.   No follow-ups on file.  Orders Placed This Encounter  Procedures  . DG Chest 2 View    Standing Status:   Future    Number of Occurrences:   1    Standing Expiration Date:   12/02/2019    Order Specific Question:   Reason for Exam (SYMPTOM  OR DIAGNOSIS REQUIRED)    Answer:   cough    Order Specific Question:   Preferred imaging location?    Answer:   [facility]    Order  Specific Question:   Radiology Contrast Protocol - do NOT remove file path    Answer:   [email or web address]    Requested Prescriptions   Signed Prescriptions Disp Refills  . amoxicillin-clavulanate (AUGMENTIN) 875-125 MG tablet 20 tablet 0    Sig: Take 1 tablet by mouth 2 (two) times daily.  Marland Kitchen nystatin (MYCOSTATIN) 100000 UNIT/ML suspension 120 mL 0    Sig: Take 5 mLs (500,000 Units total) by mouth 4 (four) times daily. Gargle and spit for thrush  . HYDROcodone-homatropine (HYCODAN) 5-1.5 MG/5ML syrup 120 mL 0    Sig: Take 5 mLs by mouth every 8 (eight) hours as needed for cough.

## 2018-10-08 ENCOUNTER — Ambulatory Visit (INDEPENDENT_AMBULATORY_CARE_PROVIDER_SITE_OTHER): Payer: PPO | Admitting: Family

## 2018-10-08 ENCOUNTER — Encounter: Payer: Self-pay | Admitting: Family

## 2018-10-08 ENCOUNTER — Encounter (HOSPITAL_COMMUNITY): Payer: Self-pay

## 2018-10-08 ENCOUNTER — Inpatient Hospital Stay (HOSPITAL_COMMUNITY)
Admission: EM | Admit: 2018-10-08 | Discharge: 2018-10-11 | DRG: 645 | Disposition: A | Discharge status: Home / Self Care | Payer: PPO | Source: Ambulatory Visit | Attending: Internal Medicine | Admitting: Internal Medicine

## 2018-10-08 ENCOUNTER — Other Ambulatory Visit: Payer: Self-pay

## 2018-10-08 VITALS — BP 154/76 | HR 78 | Temp 97.7°F | Ht 60.0 in | Wt 159.1 lb

## 2018-10-08 DIAGNOSIS — R112 Nausea with vomiting, unspecified: Secondary | ICD-10-CM | POA: Diagnosis not present

## 2018-10-08 DIAGNOSIS — J101 Influenza due to other identified influenza virus with other respiratory manifestations: Secondary | ICD-10-CM | POA: Diagnosis present

## 2018-10-08 DIAGNOSIS — Z96641 Presence of right artificial hip joint: Secondary | ICD-10-CM | POA: Diagnosis present

## 2018-10-08 DIAGNOSIS — E871 Hypo-osmolality and hyponatremia: Secondary | ICD-10-CM | POA: Diagnosis present

## 2018-10-08 DIAGNOSIS — E876 Hypokalemia: Secondary | ICD-10-CM | POA: Diagnosis not present

## 2018-10-08 DIAGNOSIS — R05 Cough: Secondary | ICD-10-CM | POA: Diagnosis not present

## 2018-10-08 DIAGNOSIS — I10 Essential (primary) hypertension: Secondary | ICD-10-CM | POA: Diagnosis present

## 2018-10-08 DIAGNOSIS — E222 Syndrome of inappropriate secretion of antidiuretic hormone: Secondary | ICD-10-CM | POA: Diagnosis not present

## 2018-10-08 DIAGNOSIS — J111 Influenza due to unidentified influenza virus with other respiratory manifestations: Secondary | ICD-10-CM | POA: Diagnosis not present

## 2018-10-08 DIAGNOSIS — K219 Gastro-esophageal reflux disease without esophagitis: Secondary | ICD-10-CM | POA: Diagnosis present

## 2018-10-08 DIAGNOSIS — Z7982 Long term (current) use of aspirin: Secondary | ICD-10-CM

## 2018-10-08 LAB — BASIC METABOLIC PANEL
ANION GAP: 11 (ref 5–15)
Anion gap: 9 (ref 5–15)
BUN: 8 mg/dL (ref 8–23)
BUN: 7 mg/dL — ABNORMAL LOW (ref 8–23)
CO2: 23 mmol/L (ref 22–32)
CO2: 22 mmol/L (ref 22–32)
Calcium: 7.2 mg/dL — ABNORMAL LOW (ref 8.9–10.3)
Calcium: 7.6 mg/dL — ABNORMAL LOW (ref 8.9–10.3)
Chloride: 79 mmol/L — ABNORMAL LOW (ref 98–111)
Chloride: 82 mmol/L — ABNORMAL LOW (ref 98–111)
Creatinine, Ser: 0.49 mg/dL (ref 0.44–1.00)
Creatinine, Ser: 0.6 mg/dL (ref 0.44–1.00)
GFR calc Af Amer: >60 mL/min (ref >60)
GFR calc Af Amer: >60 mL/min (ref >60)
GFR calc non Af Amer: >60 mL/min (ref >60)
GFR calc non Af Amer: >60 mL/min (ref >60)
Glucose, Bld: 98 mg/dL (ref 70–99)
Glucose, Bld: 90 mg/dL (ref 70–99)
Potassium: 2.9 mmol/L — ABNORMAL LOW (ref 3.5–5.1)
Potassium: 3.4 mmol/L — ABNORMAL LOW (ref 3.5–5.1)
SODIUM: 115 mmol/L — AB (ref 135–145)
Sodium: 111 mmol/L — CL (ref 135–145)

## 2018-10-08 LAB — URINALYSIS, ROUTINE W REFLEX MICROSCOPIC
BILIRUBIN URINE: NEGATIVE
GLUCOSE, UA: NEGATIVE mg/dL
HGB URINE DIPSTICK: NEGATIVE
Ketones, ur: 80 mg/dL — AB
Nitrite: NEGATIVE
Protein, ur: NEGATIVE mg/dL
Specific Gravity, Urine: 1.013 (ref 1.005–1.030)
pH: 7 (ref 5.0–8.0)

## 2018-10-08 LAB — CBC WITH DIFFERENTIAL/PLATELET
ABS IMMATURE GRANULOCYTES: 0.21 10*3/uL — AB (ref 0.00–0.07)
Basophils Absolute: 0 10*3/uL (ref 0.0–0.1)
Basophils Relative: 0 %
Eosinophils Absolute: 0 10*3/uL (ref 0.0–0.5)
Eosinophils Relative: 0 %
HCT: 31 % — ABNORMAL LOW (ref 36.0–46.0)
Hemoglobin: 11.4 g/dL — ABNORMAL LOW (ref 12.0–15.0)
IMMATURE GRANULOCYTES: 2 %
Lymphocytes Relative: 10 %
Lymphs Abs: 0.9 10*3/uL (ref 0.7–4.0)
MCH: 30.3 pg (ref 26.0–34.0)
MCHC: 36.8 g/dL — ABNORMAL HIGH (ref 30.0–36.0)
MCV: 82.4 fL (ref 80.0–100.0)
Monocytes Absolute: 1.1 10*3/uL — ABNORMAL HIGH (ref 0.1–1.0)
Monocytes Relative: 12 %
Neutro Abs: 7.5 10*3/uL (ref 1.7–7.7)
Neutrophils Relative %: 76 %
Platelets: 281 10*3/uL (ref 150–400)
RBC: 3.76 MIL/uL — ABNORMAL LOW (ref 3.87–5.11)
RDW: 11.7 % (ref 11.5–15.5)
WBC: 9.8 10*3/uL (ref 4.0–10.5)
nRBC: 0 % (ref 0.0–0.2)

## 2018-10-08 LAB — SODIUM, URINE, RANDOM: Sodium, Ur: 27 mmol/L

## 2018-10-08 LAB — MAGNESIUM: MAGNESIUM: 1.8 mg/dL (ref 1.7–2.4)

## 2018-10-08 LAB — I-STAT CHEM 8, ED
BUN: 7 mg/dL — ABNORMAL LOW (ref 8–23)
Calcium, Ion: 0.99 mmol/L — ABNORMAL LOW (ref 1.15–1.40)
Chloride: 73 mmol/L — ABNORMAL LOW (ref 98–111)
Creatinine, Ser: 0.5 mg/dL (ref 0.44–1.00)
GLUCOSE: 101 mg/dL — AB (ref 70–99)
HEMATOCRIT: 31 % — AB (ref 36.0–46.0)
Hemoglobin: 10.5 g/dL — ABNORMAL LOW (ref 12.0–15.0)
Potassium: 3.1 mmol/L — ABNORMAL LOW (ref 3.5–5.1)
Sodium: 109 mmol/L — CL (ref 135–145)
TCO2: 23 mmol/L (ref 22–32)

## 2018-10-08 LAB — OSMOLALITY, URINE
Osmolality, Ur: 443 mOsm/kg (ref 300–900)
Osmolality, Ur: 180 mosm/kg — ABNORMAL LOW (ref 300–900)

## 2018-10-08 LAB — PHOSPHORUS: Phosphorus: 2.4 mg/dL — ABNORMAL LOW (ref 2.5–4.6)

## 2018-10-08 LAB — COMPREHENSIVE METABOLIC PANEL
ALT: 43 U/L (ref 0–44)
AST: 49 U/L — ABNORMAL HIGH (ref 15–41)
Albumin: 3.7 g/dL (ref 3.5–5.0)
Alkaline Phosphatase: 48 U/L (ref 38–126)
Anion gap: 13 (ref 5–15)
BUN: 10 mg/dL (ref 8–23)
CO2: 24 mmol/L (ref 22–32)
Calcium: 8.3 mg/dL — ABNORMAL LOW (ref 8.9–10.3)
Chloride: 71 mmol/L — ABNORMAL LOW (ref 98–111)
Creatinine, Ser: 0.58 mg/dL (ref 0.44–1.00)
GFR calc Af Amer: >60 mL/min (ref >60)
GFR calc non Af Amer: >60 mL/min (ref >60)
Glucose, Bld: 114 mg/dL — ABNORMAL HIGH (ref 70–99)
POTASSIUM: 3.2 mmol/L — AB (ref 3.5–5.1)
Sodium: 108 mmol/L — CL (ref 135–145)
Total Bilirubin: 2 mg/dL — ABNORMAL HIGH (ref 0.3–1.2)
Total Protein: 6.9 g/dL (ref 6.5–8.1)

## 2018-10-08 LAB — TROPONIN I: Troponin I: <0.03 ng/mL (ref <0.03)

## 2018-10-08 LAB — BASIC METABOLIC PANEL WITH GFR
Anion gap: 10 (ref 5–15)
BUN: 7 mg/dL — ABNORMAL LOW (ref 8–23)
CO2: 21 mmol/L — ABNORMAL LOW (ref 22–32)
Calcium: 7.8 mg/dL — ABNORMAL LOW (ref 8.9–10.3)
Chloride: 86 mmol/L — ABNORMAL LOW (ref 98–111)
Creatinine, Ser: 0.66 mg/dL (ref 0.44–1.00)
GFR calc Af Amer: >60 mL/min
GFR calc non Af Amer: >60 mL/min
Glucose, Bld: 97 mg/dL (ref 70–99)
Potassium: 3.5 mmol/L (ref 3.5–5.1)
Sodium: 117 mmol/L — CL (ref 135–145)

## 2018-10-08 LAB — TSH: TSH: 0.796 u[IU]/mL (ref 0.350–4.500)

## 2018-10-08 LAB — MRSA PCR SCREENING: MRSA by PCR: NEGATIVE

## 2018-10-08 LAB — INFLUENZA PANEL BY PCR (TYPE A & B)
Influenza A By PCR: NEGATIVE
Influenza B By PCR: POSITIVE — AB

## 2018-10-08 LAB — NA AND K (SODIUM & POTASSIUM), RAND UR
Potassium Urine: 22 mmol/L
SODIUM UR: 27 mmol/L

## 2018-10-08 LAB — OSMOLALITY
Osmolality: 226 mOsm/kg — CL (ref 275–295)
Osmolality: 233 mosm/kg — CL (ref 275–295)

## 2018-10-08 LAB — CORTISOL-AM, BLOOD: Cortisol - AM: 16.8 ug/dL (ref 6.7–22.6)

## 2018-10-08 MED ORDER — ONDANSETRON HCL 4 MG PO TABS: 4.0000 mg | ORAL_TABLET | Freq: Four times a day (QID) | ORAL | Status: DC | PRN

## 2018-10-08 MED ORDER — SODIUM CHLORIDE 0.9 % IV BOLUS
1000.0000 mL | Freq: Once | INTRAVENOUS | Status: AC
  Administered 2018-10-08: 1000 mL via INTRAVENOUS

## 2018-10-08 MED ORDER — SENNOSIDES-DOCUSATE SODIUM 8.6-50 MG PO TABS: 1.0000 | ORAL_TABLET | Freq: Every evening | ORAL | Status: DC | PRN

## 2018-10-08 MED ORDER — ENOXAPARIN SODIUM 40 MG/0.4ML ~~LOC~~ SOLN
40.0000 mg | SUBCUTANEOUS | Status: DC
  Administered 2018-10-08 – 2018-10-10 (×3): 40 mg via SUBCUTANEOUS
  Filled 2018-10-08 (×3): qty 0.4

## 2018-10-08 MED ORDER — POLYVINYL ALCOHOL 1.4 % OP SOLN
1.0000 [drp] | Freq: Two times a day (BID) | OPHTHALMIC | Status: DC | PRN
  Filled 2018-10-08: qty 15

## 2018-10-08 MED ORDER — ONDANSETRON HCL 4 MG/2ML IJ SOLN: 4.0000 mg | Freq: Four times a day (QID) | INTRAMUSCULAR | Status: DC | PRN

## 2018-10-08 MED ORDER — BENAZEPRIL HCL 20 MG PO TABS
40.0000 mg | ORAL_TABLET | Freq: Every day | ORAL | Status: DC
  Administered 2018-10-08 – 2018-10-09 (×2): 40 mg via ORAL
  Filled 2018-10-08 (×2): qty 2

## 2018-10-08 MED ORDER — ONDANSETRON HCL 4 MG/2ML IJ SOLN
4.0000 mg | Freq: Once | INTRAMUSCULAR | Status: DC
  Administered 2018-10-08: 4 mg via INTRAMUSCULAR

## 2018-10-08 MED ORDER — POTASSIUM CHLORIDE 10 MEQ/100ML IV SOLN
10.0000 meq | INTRAVENOUS | Status: AC
  Administered 2018-10-08 (×4): 10 meq via INTRAVENOUS
  Filled 2018-10-08 (×4): qty 100

## 2018-10-08 MED ORDER — ONDANSETRON 4 MG PO TBDP: 4.0000 mg | ORAL_TABLET | Freq: Three times a day (TID) | ORAL | 0 refills | Status: DC | PRN

## 2018-10-08 MED ORDER — HYDROCODONE-HOMATROPINE 5-1.5 MG/5ML PO SYRP: 5.0000 mL | ORAL_SOLUTION | Freq: Three times a day (TID) | ORAL | Status: DC | PRN

## 2018-10-08 MED ORDER — ONDANSETRON 4 MG PO TBDP: 4.0000 mg | ORAL_TABLET | Freq: Once | ORAL | Status: DC

## 2018-10-08 MED ORDER — ACETAMINOPHEN 325 MG PO TABS: 650.0000 mg | ORAL_TABLET | Freq: Four times a day (QID) | ORAL | Status: DC | PRN

## 2018-10-08 MED ORDER — TRAMADOL HCL 50 MG PO TABS: 50.0000 mg | ORAL_TABLET | Freq: Four times a day (QID) | ORAL | Status: DC | PRN

## 2018-10-08 MED ORDER — OSELTAMIVIR PHOSPHATE 75 MG PO CAPS
75.0000 mg | ORAL_CAPSULE | Freq: Once | ORAL | Status: AC
  Administered 2018-10-08: 75 mg via ORAL
  Filled 2018-10-08: qty 1

## 2018-10-08 MED ORDER — ACETAMINOPHEN 650 MG RE SUPP: 650.0000 mg | Freq: Four times a day (QID) | RECTAL | Status: DC | PRN

## 2018-10-08 MED ORDER — POTASSIUM CHLORIDE CRYS ER 20 MEQ PO TBCR
40.0000 meq | EXTENDED_RELEASE_TABLET | Freq: Two times a day (BID) | ORAL | Status: DC
  Administered 2018-10-08 – 2018-10-09 (×2): 40 meq via ORAL
  Filled 2018-10-08 (×2): qty 2

## 2018-10-08 MED ORDER — POLYETHYL GLYCOL-PROPYL GLYCOL 0.4-0.3 % OP GEL: Freq: Two times a day (BID) | OPHTHALMIC | Status: DC | PRN

## 2018-10-08 MED ORDER — ASPIRIN EC 81 MG PO TBEC
81.0000 mg | DELAYED_RELEASE_TABLET | Freq: Every day | ORAL | Status: DC
  Administered 2018-10-08 – 2018-10-10 (×3): 81 mg via ORAL
  Filled 2018-10-08 (×3): qty 1

## 2018-10-08 MED ORDER — SODIUM CHLORIDE 0.9 % IV SOLN: INTRAVENOUS | Status: DC

## 2018-10-08 MED ORDER — SODIUM CHLORIDE 3 % IV SOLN
INTRAVENOUS | Status: DC
  Administered 2018-10-08: 35 mL/h via INTRAVENOUS
  Filled 2018-10-08 (×2): qty 500

## 2018-10-08 NOTE — Progress Notes (Signed)
CRITICAL VALUE ALERT  Critical Value:  Serum Osmolality 233  Date & Time Notied:  2210 10/08/18  Provider Notified: Lamar Blinks  Orders Received/Actions taken: awaiting orders

## 2018-10-08 NOTE — ED Notes (Signed)
Confirmed with lab that we need gold top for cortisol. Ok to drawn BMP and Cortisol at the same time after 2nd bolus completed per provider request to reduce invasive care for patient x2.

## 2018-10-08 NOTE — Patient Instructions (Signed)
Please try drinking Gatorade, Ginger Ale, Sprite; if the nausea persists, you will need to go to the ER for IV fluids.

## 2018-10-08 NOTE — ED Notes (Addendum)
Date and time results received: 10/08/18 2:30 PM    Test: serum osmolality Critical Value: 226  Name of Provider Notified: hospitalist paged per secretary and primary nurse notified.  Orders Received? Or Actions Taken?:  Awaiting response from page

## 2018-10-08 NOTE — ED Notes (Signed)
Spoke with hospitalist regarding critical value result (see documentation 1430). Per hospitalist verbal stop fluids. Primary nurse made aware and verbalizes understanding.

## 2018-10-08 NOTE — Consult Note (Signed)
[facility] Nephrology Consultation Note  Requesting MD: Dr. Nicolette Bang Reason for consult: Hyponatremia   Assessment/Plan:  # Subacute/chronic hyponatremia: Due to decreased oral intake and HCTZ. Influenza is also known to cause hyponatremia (SIADH). Patient is dry on physical exam and currently asymptomatic.  She already received 1.5 L of normal saline.  Initial serum sodium level 108 and the follow-up sodium level is 111. -Urine osmolality is high, check urine sodium and potassium level. -TSH level acceptable.  I will check cortisol level as well. -Given high urine osmolality and severity of hyponatremia I will start 3% saline with close monitoring of BMP.  Goal serum sodium level around 115 by tomorrow morning.  Encourage oral intake.  #Hypokalemia due to reduce intake and likely exacerbated by IVF causing increased sodium delivery. -Magnesium level acceptable.  Ordered 4 rounds of IV potassium chloride.  Starting oral potassium as well.  Monitoring labs.  #Hypertension: Blood pressure acceptable.  Continue to monitor.  Hold diuretics.  #Influenza B: On Tamiflu.  Continue supportive care per primary team.  Thank you for the consult.  We will continue to follow with you.   HPI:  Alexandra Scott is a 73 y.o. female.  History of hypertension, acid reflux, osteoarthritis presented with weakness, decreased oral intake for about a week.  Patient has history of hypertension therefore she is on triamterene hydrochlorothiazide daily.  Patient reported she has not been feeling well for a week associated with generalized weakness, fatigue, decreased oral intake.  In the ER patient was found to have serum sodium level of 108.  She was given 1.5 L of normal saline.  The repeat serum sodium level was 109 and then 111.  Potassium level 2.9 therefore ordered IV KCl.  Serum creatinine level is normal.  She was found to have flu positive therefore started on Tamiflu.  Patient  denied headache, dizziness, nausea, vomiting, abdominal pain, chest pain or shortness of breath.  She has dry cough.  No urinary symptoms. Urine osmolality 443, serum osmolality 226.  She denied sick contact.  Her husband at bedside in ER.  Creatinine, Ser  Date/Time Value Ref Range Status  10/08/2018 02:52 PM 0.49 0.44 - 1.00 mg/dL Final  10/08/2018 01:21 PM 0.50 0.44 - 1.00 mg/dL Final  10/08/2018 11:17 AM 0.58 0.44 - 1.00 mg/dL Final  02/07/2018 08:29 AM 0.89 0.40 - 1.20 mg/dL Final  01/10/2017 11:49 AM 1.02 0.40 - 1.20 mg/dL Final  01/11/2016 04:32 PM 0.98 0.40 - 1.20 mg/dL Final  06/30/2015 08:20 AM 0.93 0.40 - 1.20 mg/dL Final  12/30/2014 07:29 AM 0.97 0.40 - 1.20 mg/dL Final  05/21/2014 04:11 PM 1.03 0.50 - 1.10 mg/dL Final  11/22/2013 04:16 PM 0.9 0.4 - 1.2 mg/dL Final  05/14/2013 07:42 AM 0.9 0.4 - 1.2 mg/dL Final  11/13/2012 08:40 AM 1.0 0.4 - 1.2 mg/dL Final  05/11/2012 07:59 AM 0.7 0.4 - 1.2 mg/dL Final  11/02/2011 03:53 PM 0.8 0.4 - 1.2 mg/dL Final  04/10/2011 04:40 AM 0.65 0.50 - 1.10 mg/dL Final    Comment:    **Please note change in reference range.**  04/09/2011 04:15 AM 0.64 0.50 - 1.10 mg/dL Final    Comment:    **Please note change in reference range.**  04/08/2011 04:42 AM 0.50 0.50 - 1.10 mg/dL Final    Comment:    **Please note change in reference range.**  04/07/2011 04:46 AM 0.54 0.50 - 1.10 mg/dL Final    Comment:    **Please note change in reference  range.**  03/29/2011 03:45 PM 0.73 0.50 - 1.10 mg/dL Final    Comment:    **Please note change in reference range.**  03/16/2011 07:35 AM 0.9 0.4 - 1.2 mg/dL Final  11/10/2010 05:03 PM 0.8 0.4 - 1.2 mg/dL Final  04/15/2010 07:39 AM 0.6 0.4 - 1.2 mg/dL Final  10/20/2009 07:25 AM 0.7 0.4 - 1.2 mg/dL Final  10/17/2008 08:56 AM 0.7 0.4 - 1.2 mg/dL Final  10/15/2007 10:38 AM 0.8 0.4 - 1.2 mg/dL Final  10/09/2006 10:11 AM 0.8 0.4 - 1.2 mg/dL Final     PMHx:   Past Medical History:  Diagnosis Date  .  Breast cyst 2008   Left / SER  . Colon polyps   . Constipation   . GERD (gastroesophageal reflux disease)    uses OTC  . HTN (hypertension)   . LBP (low back pain)    ? Spinal stenosis  . Osteoarthritis   . Osteoarthritis of hip 2011   Right/Dr Fields    Past Surgical History:  Procedure Laterality Date  . BREAST DUCTAL SYSTEM EXCISION Left 05/22/2014   Procedure: LEFT NIPPLE DUCT EXCISION;  Surgeon: Imogene Burn. Georgette Dover, MD;  Location: [facility];  Service: General;  Laterality: Left;  . COLONOSCOPY W/ BIOPSIES AND POLYPECTOMY    . JOINT REPLACEMENT  7/12   R THR    Family Hx:  Family History  Problem Relation Age of Onset  . Colon cancer Maternal Uncle   . Hypertension Other   . Breast cancer Other     Social History:  reports that she has never smoked. She has never used smokeless tobacco. She reports that she does not drink alcohol or use drugs.  Allergies:  Allergies  Allergen Reactions  . Augmentin [Amoxicillin-Pot Clavulanate] Nausea And Vomiting    DID THE REACTION INVOLVE: Swelling of the face/tongue/throat, SOB, or low BP? No Sudden or severe rash/hives, skin peeling, or the inside of the mouth or nose? No Did it require medical treatment? No When did it last happen?Jan 2020 If all above answers are "NO", may proceed with cephalosporin use.   . Cortisone Anxiety    Agitation  . Prednisone Anxiety    Agitation    Medications: Prior to Admission medications   Medication Sig Start Date End Date Taking? Authorizing Provider  aspirin 81 MG EC tablet Take 81 mg by mouth at bedtime.    Yes [provider]  benazepril (LOTENSIN) 40 MG tablet TAKE 1 TABLET BY MOUTH ONCE DAILY. Patient taking differently: Take 40 mg by mouth at bedtime.  02/06/18  Yes Plotnikov, Evie Lacks, MD  BIOTIN PO Take 1 tablet by mouth at bedtime.   Yes [provider]  Calcium Carbonate-Vitamin D3 (CALCIUM 600/VITAMIN D) 600-400 MG-UNIT TABS Take 1 tablet by mouth 2 (two) times  daily.    Yes [provider]  HYDROcodone-homatropine (HYCODAN) 5-1.5 MG/5ML syrup Take 5 mLs by mouth every 8 (eight) hours as needed for cough. 10/03/18  Yes Marrian Salvage, FNP  Magnesium 250 MG TABS Take 250 mg by mouth daily.    Yes [provider]  Omega-3 Fatty Acids (FISH OIL) 1000 MG CAPS Take 1,000 mg by mouth 2 (two) times daily. Reported on 01/11/2016   Yes [provider]  Polyethyl Glycol-Propyl Glycol (SYSTANE OP) Place 1 drop into both eyes 2 (two) times daily as needed (dry eyes).   Yes [provider]  triamterene-hydrochlorothiazide (MAXZIDE-25) 37.5-25 MG tablet Take 1 tablet by mouth daily. Patient taking  differently: Take 1 tablet by mouth at bedtime.  02/06/18  Yes Plotnikov, Evie Lacks, MD  Turmeric 500 MG TABS Take 500 mg by mouth 2 (two) times daily.    Yes [provider]  vitamin C (ASCORBIC ACID) 500 MG tablet Take 500 mg by mouth at bedtime.    Yes [provider]  amoxicillin-clavulanate (AUGMENTIN) 875-125 MG tablet Take 1 tablet by mouth 2 (two) times daily.    [provider]  nystatin (MYCOSTATIN) 100000 UNIT/ML suspension Take 5 mLs (500,000 Units total) by mouth 4 (four) times daily. Gargle and spit for thrush Patient not taking: Reported on 10/08/2018 10/03/18   Marrian Salvage, FNP  ondansetron (ZOFRAN ODT) 4 MG disintegrating tablet Take 1 tablet (4 mg total) by mouth every 8 (eight) hours as needed for nausea or vomiting. 10/08/18   Marrian Salvage, [place]    I have reviewed the patient's current medications.  Labs:  Results for orders placed or performed during the hospital encounter of 10/08/18 (from the past 48 hour(s))  Comprehensive metabolic panel     Status: Abnormal   Collection Time: 10/08/18 11:17 AM  Result Value Ref Range   Sodium 108 (LL) 135 - 145 mmol/L    Comment: CRITICAL RESULT CALLED TO, READ BACK BY AND VERIFIED WITH: L.VAUGHN RN AT 1203 ON 10/08/18 BY  S.VANHOORNE    Potassium 3.2 (L) 3.5 - 5.1 mmol/L   Chloride 71 (L) 98 - 111 mmol/L   CO2 24 22 - 32 mmol/L   Glucose, Bld 114 (H) 70 - 99 mg/dL   BUN 10 8 - 23 mg/dL   Creatinine, Ser 0.58 0.44 - 1.00 mg/dL   Calcium 8.3 (L) 8.9 - 10.3 mg/dL   Total Protein 6.9 6.5 - 8.1 g/dL   Albumin 3.7 3.5 - 5.0 g/dL   AST 49 (H) 15 - 41 U/L   ALT 43 0 - 44 U/L   Alkaline Phosphatase 48 38 - 126 U/L   Total Bilirubin 2.0 (H) 0.3 - 1.2 mg/dL   GFR calc non Af Amer >60 >60 mL/min   GFR calc Af Amer >60 >60 mL/min   Anion gap 13 5 - 15    Comment: Performed at [facility], [place] [address] [place], [place] [address]  CBC with Differential     Status: Abnormal   Collection Time: 10/08/18 11:17 AM  Result Value Ref Range   WBC 9.8 4.0 - 10.5 K/uL   RBC 3.76 (L) 3.87 - 5.11 MIL/uL   Hemoglobin 11.4 (L) 12.0 - 15.0 g/dL   HCT 31.0 (L) 36.0 - 46.0 %   MCV 82.4 80.0 - 100.0 fL   MCH 30.3 26.0 - 34.0 pg   MCHC 36.8 (H) 30.0 - 36.0 g/dL   RDW 11.7 11.5 - 15.5 %   Platelets 281 150 - 400 K/uL   nRBC 0.0 0.0 - 0.2 %   Neutrophils Relative % 76 %   Neutro Abs 7.5 1.7 - 7.7 K/uL   Lymphocytes Relative 10 %   Lymphs Abs 0.9 0.7 - 4.0 K/uL   Monocytes Relative 12 %   Monocytes Absolute 1.1 (H) 0.1 - 1.0 K/uL   Eosinophils Relative 0 %   Eosinophils Absolute 0.0 0.0 - 0.5 K/uL   Basophils Relative 0 %   Basophils Absolute 0.0 0.0 - 0.1 K/uL   Immature Granulocytes 2 %   Abs Immature Granulocytes 0.21 (H) 0.00 - 0.07 K/uL    Comment: Performed at [facility]  [place] [address] [place], [place] [address]  Magnesium     Status: None   Collection Time: 10/08/18 11:17 AM  Result Value Ref Range   Magnesium 1.8 1.7 - 2.4 mg/dL    Comment: Performed at [facility], [place] [address] [place], [place] [address]  Phosphorus     Status: Abnormal   Collection Time: 10/08/18 11:17 AM  Result Value Ref Range   Phosphorus 2.4 (L) 2.5 - 4.6 mg/dL     Comment: Performed at [facility], [place] [address], [place], [place] [address]  Troponin I - ONCE - STAT     Status: None   Collection Time: 10/08/18 11:17 AM  Result Value Ref Range   Troponin I <0.03 <0.03 ng/mL    Comment: Performed at [facility], [place] [address], [place], [place] [address]  Osmolality     Status: Abnormal   Collection Time: 10/08/18 11:17 AM  Result Value Ref Range   Osmolality 226 (LL) 275 - 295 mOsm/kg    Comment: REPEATED TO VERIFY CRITICAL RESULT CALLED TO, READ BACK BY AND VERIFIED WITH: Jeanine Luz AT 1430 10/08/2018 BY ZBEECH. Performed at [facility], [place] [address] [place], [place] [address]   Influenza panel by PCR (type A & B)     Status: Abnormal   Collection Time: 10/08/18 11:42 AM  Result Value Ref Range   Influenza A By PCR NEGATIVE NEGATIVE   Influenza B By PCR POSITIVE (A) NEGATIVE    Comment: (NOTE) The Xpert Xpress Flu assay is intended as an aid in the diagnosis of  influenza and should not be used as a sole basis for treatment.  This  assay is FDA approved for nasopharyngeal swab specimens only. Nasal  washings and aspirates are unacceptable for Xpert Xpress Flu testing. Performed at [facility], [place] [address], [place], [place] [address]   Urinalysis, Routine w reflex microscopic     Status: Abnormal   Collection Time: 10/08/18 11:43 AM  Result Value Ref Range   Color, Urine YELLOW YELLOW   APPearance HAZY (A) CLEAR   Specific Gravity, Urine 1.013 1.005 - 1.030   pH 7.0 5.0 - 8.0   Glucose, UA NEGATIVE NEGATIVE mg/dL   Hgb urine dipstick NEGATIVE NEGATIVE   Bilirubin Urine NEGATIVE NEGATIVE   Ketones, ur 80 (A) NEGATIVE mg/dL   Protein, ur NEGATIVE NEGATIVE mg/dL   Nitrite NEGATIVE NEGATIVE   Leukocytes, UA TRACE (A) NEGATIVE   RBC / HPF 0-5 0 - 5 RBC/hpf   WBC, UA 0-5 0 - 5 WBC/hpf   Bacteria, UA RARE (A) NONE SEEN   Squamous Epithelial / LPF 6-10 0 - 5     Comment: Performed at [facility], [place] [address] [place], [place] [address]  Osmolality, urine     Status: None   Collection Time: 10/08/18 11:43 AM  Result Value Ref Range   Osmolality, Ur 443 300 - 900 mOsm/kg    Comment: Performed at [place] [address], [place], [place] [address]  I-stat chem 8, ed     Status: Abnormal   Collection Time: 10/08/18  1:21 PM  Result Value Ref Range   Sodium 109 (LL) 135 - 145 mmol/L   Potassium 3.1 (L) 3.5 - 5.1 mmol/L   Chloride 73 (L) 98 - 111 mmol/L   BUN 7 (L) 8 - 23 mg/dL   Creatinine, Ser 0.50 0.44 -  1.00 mg/dL   Glucose, Bld 101 (H) 70 - 99 mg/dL   Calcium, Ion 0.99 (L) 1.15 - 1.40 mmol/L   TCO2 23 22 - 32 mmol/L   Hemoglobin 10.5 (L) 12.0 - 15.0 g/dL   HCT 31.0 (L) 36.0 - 46.0 %   Comment NOTIFIED PHYSICIAN   Basic metabolic panel     Status: Abnormal   Collection Time: 10/08/18  2:52 PM  Result Value Ref Range   Sodium 111 (LL) 135 - 145 mmol/L    Comment: CRITICAL RESULT CALLED TO, READ BACK BY AND VERIFIED WITH: INMAN,J. RN @1528  ON 01.13.2020 BY COHEN,K    Potassium 2.9 (L) 3.5 - 5.1 mmol/L   Chloride 79 (L) 98 - 111 mmol/L   CO2 23 22 - 32 mmol/L   Glucose, Bld 98 70 - 99 mg/dL   BUN 8 8 - 23 mg/dL   Creatinine, Ser 0.49 0.44 - 1.00 mg/dL   Calcium 7.2 (L) 8.9 - 10.3 mg/dL   GFR calc non Af Amer >60 >60 mL/min   GFR calc Af Amer >60 >60 mL/min   Anion gap 9 5 - 15    Comment: Performed at [facility], [place] [address], [place], [place] [address]  TSH     Status: None   Collection Time: 10/08/18  2:52 PM  Result Value Ref Range   TSH 0.796 0.350 - 4.500 uIU/mL    Comment: Performed by a 3rd Generation assay with a functional sensitivity of <=0.01 uIU/mL. Performed at [facility], [place] [address], [place], [place] [address]      ROS:  Pertinent items noted in HPI and remainder of comprehensive ROS otherwise negative.  Physical Exam: Vitals:    10/08/18 1455 10/08/18 1500  BP: 128/74 122/72  Pulse: 73 76  Resp: 17 18  Temp:    SpO2: 99% 97%     General exam: Appears calm and comfortable  Respiratory system: Clear to auscultation. Respiratory effort normal. No wheezing or crackle Cardiovascular system: S1 & S2 heard, RRR.  No pedal edema. Gastrointestinal system: Abdomen is nondistended, soft and nontender. Normal bowel sounds heard. Central nervous system: Alert and oriented. No focal neurological deficits. Extremities: Symmetric 5 x 5 power. Skin: No rashes, lesions or ulcers Psychiatry: Judgement and insight appear normal. Mood & affect appropriate.    Dron Tanna Furry 10/08/2018, 4:06 PM  [facility].

## 2018-10-08 NOTE — ED Triage Notes (Signed)
Pt states she has had a decreased appetite since last week.  Pt states that she was placed on an abx that was "too strong". Pt states that she was having dry heaves. Pt states that her PCP sent her over for her "levels checked" and that she needed fluid.

## 2018-10-08 NOTE — Progress Notes (Signed)
CRITICAL VALUE ALERT  Critical Value:  Na 115  Date & Time Notied:  10/08/18 1800  Provider Notified: Wyonia Hough  Orders Received/Actions taken: Awaiting further orders

## 2018-10-08 NOTE — Progress Notes (Signed)
CRITICAL VALUE ALERT  Critical Value:  Sodium 117  Date & Time Notied:  2140 10/08/18  Provider Notified: Lamar Blinks  Orders Received/Actions taken: Hold hypertonic saline

## 2018-10-08 NOTE — ED Notes (Signed)
Report given 2west  °

## 2018-10-08 NOTE — ED Provider Notes (Addendum)
[facility] DEPT Provider Note   CSN: [id number] Arrival date & time: 10/08/18  0945     History   Chief Complaint Chief Complaint  Patient presents with  . Nausea    HPI Alexandra Scott is a 73 y.o. female.  HPI  73 year old female comes in with chief complaint of nausea, weakness. Patient has history of hypertension, GERD.  She reports that she started feeling unwell last week.  Patient started having loss of appetite, nausea, weakness and some URI-like symptoms.  Patient saw her PCP and she was started on antibiotics for possible bronchitis and informed that she might have a flu.  Patient reports that over the weekend her symptoms have persisted and she started having dry heaving yesterday.  She saw the PCP again today who advised that she come to the ER.  Patient denies any known fevers or chills.  She continues to have generalized weakness.  She also denies any chest pain, shortness of breath and her cough is dry in nature.  Patient has 1 area of bruise in her left upper extremity, otherwise she denies any new rash, headaches, neck pain, abdominal pain, UTI-like symptoms.  Of note, she was also noted to have oral thrush at her last visit.  She denies any history of immunosuppression or immunocompromise status.  No known history of active cancer.  Past Medical History:  Diagnosis Date  . Breast cyst 2008   Left / SER  . Colon polyps   . Constipation   . GERD (gastroesophageal reflux disease)    uses OTC  . HTN (hypertension)   . LBP (low back pain)    ? Spinal stenosis  . Osteoarthritis   . Osteoarthritis of hip 2011   Right/Dr Fields    Patient Active Problem List   Diagnosis Date Noted  . Hyponatremia 10/08/2018  . Influenza B 10/08/2018  . Callus of foot 03/07/2017  . Leg pain, lateral, right 02/06/2017  . Stress at home 02/06/2017  . Abdominal pain in female 01/10/2017  . Osteoporosis, post-menopausal 01/10/2017  . Sore in  nose 01/10/2017  . GERD (gastroesophageal reflux disease) 01/10/2017  . Cystitis 01/13/2016  . Cough 01/11/2016  . Acute upper respiratory infection 09/25/2015  . Lightheadedness 06/30/2015  . Discharge from left nipple 05/06/2014  . Right shoulder pain 11/22/2013  . Fatigue 11/22/2013  . Cellulitis and abscess of buttock 01/31/2013  . Actinic keratoses 07/01/2012  . Well adult exam 05/18/2012  . Anemia 05/18/2012  . Neoplasm of uncertain behavior of skin 05/18/2012  . OSTEOARTHRITIS 08/18/2010  . DEGENERATIVE JOINT DISEASE, RIGHT HIP 07/01/2010  . HIP PAIN 04/21/2010  . LOW BACK PAIN 02/05/2010  . CONSTIPATION 10/24/2008  . RLQ PAIN 10/24/2008  . Essential hypertension 10/24/2007  . BREAST CYST 10/24/2007  . FOOT PAIN 10/24/2007  . COLONIC POLYPS, HX OF 10/24/2007    Past Surgical History:  Procedure Laterality Date  . BREAST DUCTAL SYSTEM EXCISION Left 05/22/2014   Procedure: LEFT NIPPLE DUCT EXCISION;  Surgeon: Imogene Burn. Georgette Dover, MD;  Location: [facility];  Service: General;  Laterality: Left;  . COLONOSCOPY W/ BIOPSIES AND POLYPECTOMY    . JOINT REPLACEMENT  7/12   R THR     OB History   No obstetric history on file.      Home Medications    Prior to Admission medications   Medication Sig Start Date End Date Taking? Authorizing Provider  aspirin 81 MG EC tablet Take 81 mg by mouth at  bedtime.    Yes [provider]  benazepril (LOTENSIN) 40 MG tablet TAKE 1 TABLET BY MOUTH ONCE DAILY. Patient taking differently: Take 40 mg by mouth at bedtime.  02/06/18  Yes Plotnikov, Evie Lacks, MD  BIOTIN PO Take 1 tablet by mouth at bedtime.   Yes [provider]  Calcium Carbonate-Vitamin D3 (CALCIUM 600/VITAMIN D) 600-400 MG-UNIT TABS Take 1 tablet by mouth 2 (two) times daily.    Yes [provider]  HYDROcodone-homatropine (HYCODAN) 5-1.5 MG/5ML syrup Take 5 mLs by mouth every 8 (eight) hours as needed for cough. 10/03/18  Yes Marrian Salvage, FNP    Magnesium 250 MG TABS Take 250 mg by mouth daily.    Yes [provider]  Omega-3 Fatty Acids (FISH OIL) 1000 MG CAPS Take 1,000 mg by mouth 2 (two) times daily. Reported on 01/11/2016   Yes [provider]  Polyethyl Glycol-Propyl Glycol (SYSTANE OP) Place 1 drop into both eyes 2 (two) times daily as needed (dry eyes).   Yes [provider]  triamterene-hydrochlorothiazide (MAXZIDE-25) 37.5-25 MG tablet Take 1 tablet by mouth daily. Patient taking differently: Take 1 tablet by mouth at bedtime.  02/06/18  Yes Plotnikov, Evie Lacks, MD  Turmeric 500 MG TABS Take 500 mg by mouth 2 (two) times daily.    Yes [provider]  vitamin C (ASCORBIC ACID) 500 MG tablet Take 500 mg by mouth at bedtime.    Yes [provider]  amoxicillin-clavulanate (AUGMENTIN) 875-125 MG tablet Take 1 tablet by mouth 2 (two) times daily.    [provider]  nystatin (MYCOSTATIN) 100000 UNIT/ML suspension Take 5 mLs (500,000 Units total) by mouth 4 (four) times daily. Gargle and spit for thrush Patient not taking: Reported on 10/08/2018 10/03/18   Marrian Salvage, FNP  ondansetron (ZOFRAN ODT) 4 MG disintegrating tablet Take 1 tablet (4 mg total) by mouth every 8 (eight) hours as needed for nausea or vomiting. 10/08/18   Marrian Salvage, FNP    Family History Family History  Problem Relation Age of Onset  . Colon cancer Maternal Uncle   . Hypertension Other   . Breast cancer Other     Social History Social History   Tobacco Use  . Smoking status: Never Smoker  . Smokeless tobacco: Never Used  Substance Use Topics  . Alcohol use: No    Alcohol/week: 0.0 standard drinks  . Drug use: No     Allergies   Augmentin [amoxicillin-pot clavulanate]; Cortisone; and Prednisone   Review of Systems Review of Systems  Constitutional: Positive for activity change.  Respiratory: Positive for cough.   Gastrointestinal: Positive for nausea and vomiting.   Allergic/Immunologic: Negative for immunocompromised state.  All other systems reviewed and are negative.    Physical Exam Updated Vital Signs BP 132/72 (BP Location: Left Arm)   Pulse 83   Temp 98.3 F (36.8 C) (Oral)   Resp 17   Ht 5' (1.524 m)   Wt 72 kg   SpO2 99%   BMI 31.00 kg/m   Physical Exam Vitals signs and nursing note reviewed.  Constitutional:      Appearance: She is well-developed.  HENT:     Head: Normocephalic and atraumatic.     Mouth/Throat:     Comments: Positive oral thrush, no exudates Neck:     Musculoskeletal: Normal range of motion and neck supple.  Cardiovascular:     Rate and Rhythm: Normal rate.  Pulmonary:     Effort:  Pulmonary effort is normal.  Abdominal:     General: Bowel sounds are normal.  Skin:    General: Skin is warm and dry.     Coloration: Skin is not jaundiced.  Neurological:     Mental Status: She is alert and oriented to person, place, and time.      ED Treatments / Results  Labs (all labs ordered are listed, but only abnormal results are displayed) Labs Reviewed  COMPREHENSIVE METABOLIC PANEL - Abnormal; Notable for the following components:      Result Value   Sodium 108 (*)    Potassium 3.2 (*)    Chloride 71 (*)    Glucose, Bld 114 (*)    Calcium 8.3 (*)    AST 49 (*)    Total Bilirubin 2.0 (*)    All other components within normal limits  CBC WITH DIFFERENTIAL/PLATELET - Abnormal; Notable for the following components:   RBC 3.76 (*)    Hemoglobin 11.4 (*)    HCT 31.0 (*)    MCHC 36.8 (*)    Monocytes Absolute 1.1 (*)    Abs Immature Granulocytes 0.21 (*)    All other components within normal limits  URINALYSIS, ROUTINE W REFLEX MICROSCOPIC - Abnormal; Notable for the following components:   APPearance HAZY (*)    Ketones, ur 80 (*)    Leukocytes, UA TRACE (*)    Bacteria, UA RARE (*)    All other components within normal limits  PHOSPHORUS - Abnormal; Notable for the following components:    Phosphorus 2.4 (*)    All other components within normal limits  INFLUENZA PANEL BY PCR (TYPE A & B) - Abnormal; Notable for the following components:   Influenza B By PCR POSITIVE (*)    All other components within normal limits  I-STAT CHEM 8, ED - Abnormal; Notable for the following components:   Sodium 109 (*)    Potassium 3.1 (*)    Chloride 73 (*)    BUN 7 (*)    Glucose, Bld 101 (*)    Calcium, Ion 0.99 (*)    Hemoglobin 10.5 (*)    HCT 31.0 (*)    All other components within normal limits  MAGNESIUM  TROPONIN I  OSMOLALITY, URINE  OSMOLALITY    EKG EKG Interpretation  Date/Time:  Monday October 08 2018 11:31:05 EST Ventricular Rate:  83 PR Interval:    QRS Duration: 117 QT Interval:  422 QTC Calculation: 496 R Axis:   16 Text Interpretation:  Sinus rhythm Nonspecific intraventricular conduction delay Borderline low voltage, extremity leads No acute changes No significant change since last tracing Confirmed by Varney Biles [phone]) on 10/08/2018 12:06:34 PM   Radiology No results found.  Procedures .Critical Care Performed by: Varney Biles, MD Authorized by: Varney Biles, MD   Critical care provider statement:    Critical care time (minutes):  52   Critical care start time:  10/08/2018 11:50 AM   Critical care end time:  10/08/2018 1:18 PM   Critical care time was exclusive of:  Separately billable procedures and treating other patients   Critical care was necessary to treat or prevent imminent or life-threatening deterioration of the following conditions:  Metabolic crisis   Critical care was time spent personally by me on the following activities:  Blood draw for specimens, development of treatment plan with patient or surrogate, discussions with consultants, examination of patient, obtaining history from patient or surrogate, review of old charts, re-evaluation of patient's condition  and ordering and review of laboratory studies   (including critical  care time)  Medications Ordered in ED Medications  oseltamivir (TAMIFLU) capsule 75 mg (has no administration in time range)  sodium chloride 0.9 % bolus 1,000 mL (0 mLs Intravenous Stopped 10/08/18 1211)     Initial Impression / Assessment and Plan / ED Course  I have reviewed the triage vital signs and the nursing notes.  Pertinent labs & imaging results that were available during my care of the patient were reviewed by me and considered in my medical decision making (see chart for details).  Clinical Course as of Oct 08 1324  Mon Oct 08, 2018  1314 Na is 108. Rest of the labs are normal. Flu is positive.  There have been few case reports of influenza causing hyponatremia-SIADH -which is a possibility here. Otherwise her hyponatremia could be strictly because of her poor hydration and diuretic use.  I spoke with critical care team.  In the setting of patient having no neurologic symptoms, they do not see any reason for ICU involvement.  I have called hospitalist will admit.  Patient had received 1 L of normal saline at arrival.  Sodium(!!): 108 [AN]    Clinical Course User Index [AN] Varney Biles, MD    DDx: Influenza or viral URI ACS syndrome Infection - pneumonia/UTI/Cellulitis Dehydration Electrolyte abnormality Tox syndrome, medication side effect New onset of metabolic condition Cancer  73 year old female comes into the ER with chief complaint of weakness.  She was sent to the ED by her PCP for further evaluation. It appears that patient is relatively healthy woman who started getting unwell last week.  Her primary symptom is nausea and anorexia, and she started having dry heaving and emesis over the weekend.  There is no associated abdominal pain, UTI-like symptoms -she does however have some URI-like symptoms for which she was given Augmentin.  History is not suggestive of any clear source of her symptoms.  Her abdominal exam is benign, lung exam is clear.  We will  get basic blood work, screening EKG, chest x-ray.  We will also order flu swab.  Final Clinical Impressions(s) / ED Diagnoses   Final diagnoses:  Hyponatremia  Influenza    ED Discharge Orders    None       Varney Biles, MD 10/08/18 [place], Neka Bise, MD 10/08/18 [place], Hillard Goodwine, MD 10/08/18 1326

## 2018-10-08 NOTE — ED Notes (Signed)
Spongbergm, MD made aware of sodium 111.

## 2018-10-08 NOTE — ED Notes (Signed)
Per Marijean Heath, MD continue NS fluids with potassium.

## 2018-10-08 NOTE — ED Notes (Signed)
Requested Santiago Glad, Financial controller to page for provider to inform of critical sodium value.

## 2018-10-08 NOTE — ED Notes (Addendum)
Critical Value: Sodium-108. Kathrynn Humble, Md made aware.

## 2018-10-08 NOTE — Progress Notes (Signed)
Alexandra Scott is a 73 y.o. female with the following history as recorded in [employer]:  Patient Active Problem List   Diagnosis Date Noted  . Callus of foot 03/07/2017  . Leg pain, lateral, right 02/06/2017  . Stress at home 02/06/2017  . Abdominal pain in female 01/10/2017  . Osteoporosis, post-menopausal 01/10/2017  . Sore in nose 01/10/2017  . GERD (gastroesophageal reflux disease) 01/10/2017  . Cystitis 01/13/2016  . Cough 01/11/2016  . Acute upper respiratory infection 09/25/2015  . Lightheadedness 06/30/2015  . Discharge from left nipple 05/06/2014  . Right shoulder pain 11/22/2013  . Fatigue 11/22/2013  . Cellulitis and abscess of buttock 01/31/2013  . Actinic keratoses 07/01/2012  . Well adult exam 05/18/2012  . Anemia 05/18/2012  . Neoplasm of uncertain behavior of skin 05/18/2012  . OSTEOARTHRITIS 08/18/2010  . DEGENERATIVE JOINT DISEASE, RIGHT HIP 07/01/2010  . HIP PAIN 04/21/2010  . LOW BACK PAIN 02/05/2010  . CONSTIPATION 10/24/2008  . RLQ PAIN 10/24/2008  . Essential hypertension 10/24/2007  . BREAST CYST 10/24/2007  . FOOT PAIN 10/24/2007  . COLONIC POLYPS, HX OF 10/24/2007    Current Outpatient Medications  Medication Sig Dispense Refill  . aspirin 81 MG EC tablet Take 81 mg by mouth daily.      . benazepril (LOTENSIN) 40 MG tablet TAKE 1 TABLET BY MOUTH ONCE DAILY. 30 tablet 11  . Calcium Carbonate-Vitamin D3 (CALCIUM 600/VITAMIN D) 600-400 MG-UNIT TABS Take by mouth.    Marland Kitchen HYDROcodone-homatropine (HYCODAN) 5-1.5 MG/5ML syrup Take 5 mLs by mouth every 8 (eight) hours as needed for cough. 120 mL 0  . Magnesium 250 MG TABS Take by mouth daily.    Marland Kitchen nystatin (MYCOSTATIN) 100000 UNIT/ML suspension Take 5 mLs (500,000 Units total) by mouth 4 (four) times daily. Gargle and spit for thrush 120 mL 0  . Omega-3 Fatty Acids (FISH OIL) 1000 MG CAPS Take by mouth daily. Reported on 01/11/2016    . triamterene-hydrochlorothiazide (MAXZIDE-25) 37.5-25 MG tablet Take 1  tablet by mouth daily. 30 tablet 11  . Turmeric 500 MG TABS Take by mouth 2 (two) times daily.    . vitamin C (ASCORBIC ACID) 500 MG tablet Take 500 mg by mouth daily.    . ondansetron (ZOFRAN ODT) 4 MG disintegrating tablet Take 1 tablet (4 mg total) by mouth every 8 (eight) hours as needed for nausea or vomiting. 20 tablet 0   No current facility-administered medications for this visit.     Allergies: Augmentin [amoxicillin-pot clavulanate]; Cortisone; and Prednisone  Past Medical History:  Diagnosis Date  . Breast cyst 2008   Left / SER  . Colon polyps   . Constipation   . GERD (gastroesophageal reflux disease)    uses OTC  . HTN (hypertension)   . LBP (low back pain)    ? Spinal stenosis  . Osteoarthritis   . Osteoarthritis of hip 2011   Right/Dr Fields    Past Surgical History:  Procedure Laterality Date  . BREAST DUCTAL SYSTEM EXCISION Left 05/22/2014   Procedure: LEFT NIPPLE DUCT EXCISION;  Surgeon: Imogene Burn. Georgette Dover, MD;  Location: [facility];  Service: General;  Laterality: Left;  . COLONOSCOPY W/ BIOPSIES AND POLYPECTOMY    . JOINT REPLACEMENT  7/12   R THR    Family History  Problem Relation Age of Onset  . Colon cancer Maternal Uncle   . Hypertension Other   . Breast cancer Other     Social History   Tobacco Use  .  Smoking status: Never Smoker  . Smokeless tobacco: Never Used  Substance Use Topics  . Alcohol use: No    Alcohol/week: 0.0 standard drinks    Subjective:  Patient was seen last Wednesday with sinus/ bronchitis secondary to flu symptoms; was started on Augmentin- notes symptoms of nausea/ decreased appetite started fairly quickly after starting the antibiotic;  Is concerned that the antibiotic is just too strong for her; has tried to continue taking the antibiotic twice a day since last Wednesday- felt that her symptoms were much worse yesterday- has tried to sip water to stay hydrated/ notes she only urinated 2-3 x yesterday; She did feel that oral  nystatin solution caused her to be sick yesterday- had not had problems previously; did fill Hycodan bu has not been taking this.    Objective:  Vitals:   10/08/18 0845  BP: (!) 154/76  Pulse: 78  Temp: 97.7 F (36.5 C)  TempSrc: Oral  SpO2: 96%  Weight: 159 lb 1.3 oz (72.2 kg)  Height: 5' (1.524 m)    General: Well developed, well nourished, in no acute distress; pale, weak appearing; Skin : Warm and dry.  Head: Normocephalic and atraumatic  Eyes: Sclera and conjunctiva clear; pupils round and reactive to light; extraocular movements intact  Ears: External normal; canals clear; tympanic membranes normal  Oropharynx: Pink, supple. No suspicious lesions  Neck: Supple without thyromegaly, adenopathy  Lungs: Respirations unlabored; clear to auscultation bilaterally without wheeze, rales, rhonchi  CVS exam: normal rate and regular rhythm.  Abdomen: Soft; nontender; nondistended; normoactive bowel sounds; no masses or hepatosplenomegaly  Neurologic: Alert and oriented; speech intact; face symmetrical; moves all extremities well; CNII-XII intact without focal deficit   Assessment:  1. Nausea and vomiting, intractability of vomiting not specified, unspecified vomiting type     Plan:  Suspect nausea is due to intolerance of Augmentin; due to patient's appearance in office today, recommend that she go to ER for fluids; she is given injection of Zofran in the office today as well. Follow-up to be determined but do not feel further antibiotics are needed at this time.  No follow-ups on file.  No orders of the defined types were placed in this encounter.   Requested Prescriptions   Signed Prescriptions Disp Refills  . ondansetron (ZOFRAN ODT) 4 MG disintegrating tablet 20 tablet 0    Sig: Take 1 tablet (4 mg total) by mouth every 8 (eight) hours as needed for nausea or vomiting.

## 2018-10-08 NOTE — ED Notes (Signed)
EKG given to Dr. Nanavati. 

## 2018-10-08 NOTE — H&P (Signed)
History and Physical    Alexandra Scott DOB: [birth year] DOA: 10/08/2018  PCP: Cassandria Anger, MD  Patient coming from: HOME  I have personally briefly reviewed patient's old medical records in [place]  Chief Complaint: WEAKNESS, DECREASED PO INTAKE  HPI: Alexandra Scott is a 73 y.o. female with medical history significant of hypertension on triamterene/hydrochlorothiazide, and GERD who presents to the ER with a one-week history of decreased p.o. intake, decreased energy, with nausea.  Patient reported she saw her PCP as an outpatient was started on antibiotics for possible bronchitis and was told she may have the flu.  She now presents to the ER because of persistent symptoms over the last 3 to 4 days.  In the ER patient was flu positive in the course of laboratory work-up she was found to be hyponatremic at 108.  Patient had no neurological symptoms.  Mental status was intact answered all questions without complications.  Discussed the case with nephrology who will see him in consultation with no recommendations with 3% normal saline at this point  ED Course: Received 1 L normal saline bolus, given a bolus with another liter of normal saline, recheck a BMP after that second bolus.  Also check BMPs every 6 hours x2 additional.  Patient is well outside the window for treatment with Tamiflu so we will discontinue that treat supportively.  Vital signs temperature 98.3 pulse 75 respiration 17 blood pressure 120/74 satting 95% on room air with a white count of 9.8.  Review of Systems: As per HPI otherwise 10 point review of systems negative.    Past Medical History:  Diagnosis Date  . Breast cyst 2008   Left / SER  . Colon polyps   . Constipation   . GERD (gastroesophageal reflux disease)    uses OTC  . HTN (hypertension)   . LBP (low back pain)    ? Spinal stenosis  . Osteoarthritis   . Osteoarthritis of hip 2011   Right/Dr Fields    Past Surgical  History:  Procedure Laterality Date  . BREAST DUCTAL SYSTEM EXCISION Left 05/22/2014   Procedure: LEFT NIPPLE DUCT EXCISION;  Surgeon: Imogene Burn. Georgette Dover, MD;  Location: [facility];  Service: General;  Laterality: Left;  . COLONOSCOPY W/ BIOPSIES AND POLYPECTOMY    . JOINT REPLACEMENT  7/12   R THR     reports that she has never smoked. She has never used smokeless tobacco. She reports that she does not drink alcohol or use drugs.  Allergies  Allergen Reactions  . Augmentin [Amoxicillin-Pot Clavulanate] Nausea And Vomiting    DID THE REACTION INVOLVE: Swelling of the face/tongue/throat, SOB, or low BP? No Sudden or severe rash/hives, skin peeling, or the inside of the mouth or nose? No Did it require medical treatment? No When did it last happen?Jan 2020 If all above answers are "NO", may proceed with cephalosporin use.   . Cortisone Anxiety    Agitation  . Prednisone Anxiety    Agitation    Family History  Problem Relation Age of Onset  . Colon cancer Maternal Uncle   . Hypertension Other   . Breast cancer Other      Prior to Admission medications   Medication Sig Start Date End Date Taking? Authorizing Provider  aspirin 81 MG EC tablet Take 81 mg by mouth at bedtime.    Yes [provider]  benazepril (LOTENSIN) 40 MG tablet TAKE 1 TABLET BY MOUTH ONCE DAILY. Patient taking  differently: Take 40 mg by mouth at bedtime.  02/06/18  Yes Plotnikov, Evie Lacks, MD  BIOTIN PO Take 1 tablet by mouth at bedtime.   Yes [provider]  Calcium Carbonate-Vitamin D3 (CALCIUM 600/VITAMIN D) 600-400 MG-UNIT TABS Take 1 tablet by mouth 2 (two) times daily.    Yes [provider]  HYDROcodone-homatropine (HYCODAN) 5-1.5 MG/5ML syrup Take 5 mLs by mouth every 8 (eight) hours as needed for cough. 10/03/18  Yes Marrian Salvage, FNP  Magnesium 250 MG TABS Take 250 mg by mouth daily.    Yes [provider]  Omega-3 Fatty Acids (FISH OIL) 1000 MG CAPS Take  1,000 mg by mouth 2 (two) times daily. Reported on 01/11/2016   Yes [provider]  Polyethyl Glycol-Propyl Glycol (SYSTANE OP) Place 1 drop into both eyes 2 (two) times daily as needed (dry eyes).   Yes [provider]  triamterene-hydrochlorothiazide (MAXZIDE-25) 37.5-25 MG tablet Take 1 tablet by mouth daily. Patient taking differently: Take 1 tablet by mouth at bedtime.  02/06/18  Yes Plotnikov, Evie Lacks, MD  Turmeric 500 MG TABS Take 500 mg by mouth 2 (two) times daily.    Yes [provider]  vitamin C (ASCORBIC ACID) 500 MG tablet Take 500 mg by mouth at bedtime.    Yes [provider]  amoxicillin-clavulanate (AUGMENTIN) 875-125 MG tablet Take 1 tablet by mouth 2 (two) times daily.    [provider]  nystatin (MYCOSTATIN) 100000 UNIT/ML suspension Take 5 mLs (500,000 Units total) by mouth 4 (four) times daily. Gargle and spit for thrush Patient not taking: Reported on 10/08/2018 10/03/18   Marrian Salvage, FNP  ondansetron (ZOFRAN ODT) 4 MG disintegrating tablet Take 1 tablet (4 mg total) by mouth every 8 (eight) hours as needed for nausea or vomiting. 10/08/18   Marrian Salvage, FNP    Physical Exam: Vitals:   10/08/18 1138 10/08/18 1200 10/08/18 1300 10/08/18 1330  BP: (!) 142/66 137/76 132/72 121/74  Pulse: 86 87 83 75  Resp: (!) 24 20 17 17   Temp:      TempSrc:      SpO2: 97% 100% 99% 95%  Weight:      Height:        Constitutional: NAD, calm, comfortable Vitals:   10/08/18 1138 10/08/18 1200 10/08/18 1300 10/08/18 1330  BP: (!) 142/66 137/76 132/72 121/74  Pulse: 86 87 83 75  Resp: (!) 24 20 17 17   Temp:      TempSrc:      SpO2: 97% 100% 99% 95%  Weight:      Height:       Eyes: PERRL, lids and conjunctivae normal ENMT: Mucous membranes are moist. Posterior pharynx clear of any exudate or lesions.Normal dentition.  Neck: normal, supple, no masses, no thyromegaly Respiratory: Rhonchi bilaterally, no accessory  muscle use no rales noted with a limited exam secondary rhonchi.  Cardiovascular: Regular rate and rhythm, no murmurs / rubs / gallops. No extremity edema. 2+ pedal pulses. No carotid bruits.  Abdomen: no tenderness, no masses palpated. No hepatosplenomegaly. Bowel sounds positive.  Musculoskeletal: no clubbing / cyanosis. No joint deformity upper and lower extremities. Good ROM, no contractures. Normal muscle tone.  Skin: Dry, mild tenting Neurologic: CN 2-12 grossly intact. Sensation intact, DTR normal. Strength 5/5 in all 4.  Psychiatric: Normal judgment and insight. Alert and oriented x 3. Normal mood.    Labs on Admission: I have personally reviewed following labs and imaging studies  CBC: Recent Labs  Lab 10/08/18 1117 10/08/18 1321  WBC 9.8  --   NEUTROABS 7.5  --   HGB 11.4* 10.5*  HCT 31.0* 31.0*  MCV 82.4  --   PLT 281  --    Basic Metabolic Panel: Recent Labs  Lab 10/08/18 1117 10/08/18 1321  NA 108* 109*  K 3.2* 3.1*  CL 71* 73*  CO2 24  --   GLUCOSE 114* 101*  BUN 10 7*  CREATININE 0.58 0.50  CALCIUM 8.3*  --   MG 1.8  --   PHOS 2.4*  --    GFR: Estimated Creatinine Clearance: 56.3 mL/min (by C-G formula based on SCr of 0.5 mg/dL). Liver Function Tests: Recent Labs  Lab 10/08/18 1117  AST 49*  ALT 43  ALKPHOS 48  BILITOT 2.0*  PROT 6.9  ALBUMIN 3.7   No results for input(s): LIPASE, AMYLASE in the last 168 hours. No results for input(s): AMMONIA in the last 168 hours. Coagulation Profile: No results for input(s): INR, PROTIME in the last 168 hours. Cardiac Enzymes: Recent Labs  Lab 10/08/18 1117  TROPONINI <0.03   BNP (last 3 results) No results for input(s): PROBNP in the last 8760 hours. HbA1C: No results for input(s): HGBA1C in the last 72 hours. CBG: No results for input(s): GLUCAP in the last 168 hours. Lipid Profile: No results for input(s): CHOL, HDL, LDLCALC, TRIG, CHOLHDL, LDLDIRECT in the last 72 hours. Thyroid Function  Tests: No results for input(s): TSH, T4TOTAL, FREET4, T3FREE, THYROIDAB in the last 72 hours. Anemia Panel: No results for input(s): VITAMINB12, FOLATE, FERRITIN, TIBC, IRON, RETICCTPCT in the last 72 hours. Urine analysis:    Component Value Date/Time   COLORURINE YELLOW 10/08/2018 1143   APPEARANCEUR HAZY (A) 10/08/2018 1143   LABSPEC 1.013 10/08/2018 1143   PHURINE 7.0 10/08/2018 1143   GLUCOSEU NEGATIVE 10/08/2018 1143   GLUCOSEU NEGATIVE 02/07/2018 0829   HGBUR NEGATIVE 10/08/2018 1143   BILIRUBINUR NEGATIVE 10/08/2018 1143   KETONESUR 80 (A) 10/08/2018 1143   PROTEINUR NEGATIVE 10/08/2018 1143   UROBILINOGEN 0.2 02/07/2018 0829   NITRITE NEGATIVE 10/08/2018 1143   LEUKOCYTESUR TRACE (A) 10/08/2018 1143    Radiological Exams on Admission: No results found.  EKG: Independently reviewed.  Normal sinus rhythm no acute ST changes  Assessment/Plan Principal Problem:   Hyponatremia Active Problems:   Essential hypertension   GERD (gastroesophageal reflux disease)   Influenza B    Hyponatremia.  Severe although patient without neurological symptoms.  Discussed the case with nephrology will bolus an additional normal saline, recheck a BMP after that, we will not use 3% at this point time given lack of symptoms.  Recheck BMPs every 6 hours.  Patient be on telemetry.  Hypertension hold patient's triamterene/hydrochlorothiazide likely contributing to low sodium.  Continue home medications.  GERD continue PPI  Influenza B.  Patient well outside the window of benefit from Tamiflu.  Continue to treat supportively.  DVT prophylaxis: lovenox Code Status: full Family Communication: husband at bedside Disposition Plan: to home 1-2 days Consults called: neph dr Jearld Lesch Admission status: inpt sdu  Nicolette Bang MD [facility]   If 7PM-7AM, please contact night-coverage [email or web address] Password [email or web address]  10/08/2018, 1:48 PM

## 2018-10-08 NOTE — Progress Notes (Signed)
Repeat serum Na 115, K 3.4. Patient got 3% saline for about an hour.  I will hold 3 % saline for now and monitor BMP every 4 hours. Discussed with ICU nurse.

## 2018-10-09 LAB — BASIC METABOLIC PANEL
Anion gap: 8 (ref 5–15)
Anion gap: 8 (ref 5–15)
Anion gap: 9 (ref 5–15)
Anion gap: 8 (ref 5–15)
Anion gap: 7 (ref 5–15)
Anion gap: 7 (ref 5–15)
BUN: 7 mg/dL — ABNORMAL LOW (ref 8–23)
BUN: 8 mg/dL (ref 8–23)
BUN: 8 mg/dL (ref 8–23)
BUN: 8 mg/dL (ref 8–23)
BUN: 7 mg/dL — ABNORMAL LOW (ref 8–23)
BUN: 9 mg/dL (ref 8–23)
CO2: 22 mmol/L (ref 22–32)
CO2: 23 mmol/L (ref 22–32)
CO2: 24 mmol/L (ref 22–32)
CO2: 26 mmol/L (ref 22–32)
CO2: 25 mmol/L (ref 22–32)
CO2: 23 mmol/L (ref 22–32)
CREATININE: 0.58 mg/dL (ref 0.44–1.00)
Calcium: 8.1 mg/dL — ABNORMAL LOW (ref 8.9–10.3)
Calcium: 8 mg/dL — ABNORMAL LOW (ref 8.9–10.3)
Calcium: 8.3 mg/dL — ABNORMAL LOW (ref 8.9–10.3)
Calcium: 8.1 mg/dL — ABNORMAL LOW (ref 8.9–10.3)
Calcium: 8.2 mg/dL — ABNORMAL LOW (ref 8.9–10.3)
Calcium: 8 mg/dL — ABNORMAL LOW (ref 8.9–10.3)
Chloride: 87 mmol/L — ABNORMAL LOW (ref 98–111)
Chloride: 90 mmol/L — ABNORMAL LOW (ref 98–111)
Chloride: 90 mmol/L — ABNORMAL LOW (ref 98–111)
Chloride: 89 mmol/L — ABNORMAL LOW (ref 98–111)
Chloride: 90 mmol/L — ABNORMAL LOW (ref 98–111)
Chloride: 92 mmol/L — ABNORMAL LOW (ref 98–111)
Creatinine, Ser: 0.64 mg/dL (ref 0.44–1.00)
Creatinine, Ser: 0.66 mg/dL (ref 0.44–1.00)
Creatinine, Ser: 0.59 mg/dL (ref 0.44–1.00)
Creatinine, Ser: 0.65 mg/dL (ref 0.44–1.00)
Creatinine, Ser: 0.7 mg/dL (ref 0.44–1.00)
GFR calc Af Amer: >60 mL/min (ref >60)
GFR calc Af Amer: >60 mL/min (ref >60)
GFR calc Af Amer: >60 mL/min (ref >60)
GFR calc Af Amer: >60 mL/min (ref >60)
GFR calc non Af Amer: >60 mL/min (ref >60)
GFR calc non Af Amer: >60 mL/min (ref >60)
GFR calc non Af Amer: >60 mL/min (ref >60)
GFR calc non Af Amer: >60 mL/min (ref >60)
Glucose, Bld: 91 mg/dL (ref 70–99)
Glucose, Bld: 90 mg/dL (ref 70–99)
Glucose, Bld: 133 mg/dL — ABNORMAL HIGH (ref 70–99)
Glucose, Bld: 95 mg/dL (ref 70–99)
Glucose, Bld: 107 mg/dL — ABNORMAL HIGH (ref 70–99)
Glucose, Bld: 101 mg/dL — ABNORMAL HIGH (ref 70–99)
POTASSIUM: 4.2 mmol/L (ref 3.5–5.1)
Potassium: 4.1 mmol/L (ref 3.5–5.1)
Potassium: 3.7 mmol/L (ref 3.5–5.1)
Potassium: 3.9 mmol/L (ref 3.5–5.1)
Potassium: 4.3 mmol/L (ref 3.5–5.1)
Potassium: 4.5 mmol/L (ref 3.5–5.1)
Sodium: 120 mmol/L — ABNORMAL LOW (ref 135–145)
Sodium: 120 mmol/L — ABNORMAL LOW (ref 135–145)
Sodium: 122 mmol/L — ABNORMAL LOW (ref 135–145)
Sodium: 121 mmol/L — ABNORMAL LOW (ref 135–145)
Sodium: 123 mmol/L — ABNORMAL LOW (ref 135–145)
Sodium: 122 mmol/L — ABNORMAL LOW (ref 135–145)

## 2018-10-09 LAB — CBC
HCT: 28.8 % — ABNORMAL LOW (ref 36.0–46.0)
Hemoglobin: 10.1 g/dL — ABNORMAL LOW (ref 12.0–15.0)
MCH: 30.2 pg (ref 26.0–34.0)
MCHC: 35.1 g/dL (ref 30.0–36.0)
MCV: 86.2 fL (ref 80.0–100.0)
Platelets: 254 10*3/uL (ref 150–400)
RBC: 3.34 MIL/uL — ABNORMAL LOW (ref 3.87–5.11)
RDW: 11.8 % (ref 11.5–15.5)
WBC: 10 10*3/uL (ref 4.0–10.5)
nRBC: 0 % (ref 0.0–0.2)

## 2018-10-09 LAB — MAGNESIUM: Magnesium: 1.8 mg/dL (ref 1.7–2.4)

## 2018-10-09 MED ORDER — OSELTAMIVIR PHOSPHATE 75 MG PO CAPS
75.0000 mg | ORAL_CAPSULE | Freq: Two times a day (BID) | ORAL | Status: DC
  Administered 2018-10-09 – 2018-10-10 (×3): 75 mg via ORAL
  Filled 2018-10-09 (×4): qty 1

## 2018-10-09 MED ORDER — DEXTROSE 5 % IV SOLN: INTRAVENOUS | Status: DC

## 2018-10-09 MED ORDER — POTASSIUM CHLORIDE CRYS ER 20 MEQ PO TBCR: 40.0000 meq | EXTENDED_RELEASE_TABLET | Freq: Every day | ORAL | Status: DC

## 2018-10-09 MED ORDER — ADULT MULTIVITAMIN W/MINERALS CH
1.0000 | ORAL_TABLET | Freq: Every day | ORAL | Status: DC
  Administered 2018-10-09 – 2018-10-10 (×2): 1 via ORAL
  Filled 2018-10-09 (×3): qty 1

## 2018-10-09 MED ORDER — BOOST / RESOURCE BREEZE PO LIQD CUSTOM
1.0000 | Freq: Two times a day (BID) | ORAL | Status: DC
  Administered 2018-10-09 – 2018-10-10 (×2): 1 via ORAL

## 2018-10-09 NOTE — Progress Notes (Signed)
[facility] NEPHROLOGY PROGRESS NOTE  Assessment/ Plan: Pt is a 73 y.o. yo female with history of acid reflux, osteoarthritis, hypertension on triamterene/hydrochlorothiazide, admitted with influenza and hyponatremia with serum sodium level of 108.  # Subacute/chronic hyponatremia: Due to decreased oral intake and HCTZ. Influenza is known to cause hyponatremia (SIADH).  -Patient received around 2 L of normal saline and around 1 hour of hypertonic saline yesterday evening.  Serum sodium level gradually improved to 121 today.  Patient is asymptomatic and euvolemic on exam.  Not on IV fluid anymore.  Encourage oral intake.  Continue to monitor BMP every 4 hourly to monitor rapid correction.  -Encourage oral intake. -Urine sodium 27, initial osmolality 443, repeat osmolality after fluid is 180. -TSH and serum cortisol level acceptable. -Discontinue HCTZ.  #Hypokalemia: Improved now,  monitor BMP.  #Hypertension: Blood pressure acceptable.    Continue benazepril.  Off diuretics.  #Influenza B: Received a dose of Tamiflu in ER, management per primary team.  Thank you for the consult.  We will continue to follow with you.  Subjective:   Objective Vital signs in last 24 hours: Vitals:   10/09/18 1105 10/09/18 1209 10/09/18 1400 10/09/18 1505  BP:  130/83 (!) 119/54   Pulse:  70 77   Resp:  (!) 24 18   Temp: 97.7 F (36.5 C)   97.8 F (36.6 C)  TempSrc: Oral   Axillary  SpO2:  98% 98%   Weight:      Height:       Weight change:   Intake/Output Summary (Last 24 hours) at 10/09/2018 1558 Last data filed at 10/09/2018 1500 Gross per 24 hour  Intake 1640.91 ml  Output -  Net 1640.91 ml       Labs: Basic Metabolic Panel: Recent Labs  Lab 10/08/18 1117  10/09/18 0940 10/09/18 1156 10/09/18 1439  NA 108*   < > 122* 121* 123*  K 3.2*   < > 3.9 4.3 4.5  CL 71*   < > 90* 89* 90*  CO2 24   < > 23 24 26   GLUCOSE 114*   < > 133* 95 107*  BUN 10   < > 8 8 7*   CREATININE 0.58   < > 0.58 0.59 0.65  CALCIUM 8.3*   < > 8.3* 8.1* 8.2*  PHOS 2.4*  --   --   --   --    < > = values in this interval not displayed.   Liver Function Tests: Recent Labs  Lab 10/08/18 1117  AST 49*  ALT 43  ALKPHOS 48  BILITOT 2.0*  PROT 6.9  ALBUMIN 3.7   No results for input(s): LIPASE, AMYLASE in the last 168 hours. No results for input(s): AMMONIA in the last 168 hours. CBC: Recent Labs  Lab 10/08/18 1117 10/08/18 1321 10/09/18 0404  WBC 9.8  --  10.0  NEUTROABS 7.5  --   --   HGB 11.4* 10.5* 10.1*  HCT 31.0* 31.0* 28.8*  MCV 82.4  --  86.2  PLT 281  --  254   Cardiac Enzymes: Recent Labs  Lab 10/08/18 1117  TROPONINI <0.03   CBG: No results for input(s): GLUCAP in the last 168 hours.  Iron Studies: No results for input(s): IRON, TIBC, TRANSFERRIN, FERRITIN in the last 72 hours. Studies/Results: No results found.  Medications: Infusions:   Scheduled Medications: . aspirin EC  81 mg Oral QHS  . benazepril  40 mg Oral QHS  .  enoxaparin (LOVENOX) injection  40 mg Subcutaneous Q24H  . feeding supplement  1 Container Oral BID BM  . multivitamin with minerals  1 tablet Oral Daily  . [START ON 10/10/2018] potassium chloride  40 mEq Oral Daily    have reviewed scheduled and prn medications.  Physical Exam: General:NAD, comfortable Heart:RRR, s1s2 nl Lungs:clear b/l, no crackle Abdomen:soft, Non-tender, non-distended Extremities:No edema Neurology: Alert awake and following commands.    Prasad  10/09/2018,3:58 PM  LOS: 1 day

## 2018-10-09 NOTE — Progress Notes (Signed)
Initial Nutrition Assessment  DOCUMENTATION CODES:   Obesity unspecified  INTERVENTION:  - Will order Boost Breeze BID, each supplement provides 250 kcal and 9 grams of protein. - Will order daily multivitamin with minerals. - Continue to encourage PO intakes.    NUTRITION DIAGNOSIS:   Inadequate oral intake related to acute illness, nausea, decreased appetite as evidenced by per patient/family report, meal completion < 50%.  GOAL:   Patient will meet greater than or equal to 90% of their needs  MONITOR:   PO intake, Supplement acceptance, Weight trends, Labs  REASON FOR ASSESSMENT:   Malnutrition Screening Tool  ASSESSMENT:   73 y.o. female with medical history significant of HTN and GERD. She presented to the ED with a one-week history of decreased p.o. intake, decreased energy, and nausea.  Patient reported she saw her PCP as an outpatient was started on antibiotics for possible bronchitis and was told she may have the flu.  he presented the ED because of persistent symptoms for 3-4 days. Found to be flu positive in the ED. Also noted to be hyponatremic (108 mmol/L).  No intakes documented since admission. Patient reports eating a few bites of a biscuit sandwich from the cafeteria this AM but that it was not good so she stopped eating. She was planning to order lunch soon after RD visit. Patient reports feeling better today and that she is actually feeling hungry today. Patient reports poor sleep for >1 week and that last night was the first night she slept more than an hour or two.   She states that ~8 days ago was when she last had something substantial to eat. She states very poor appetite over the past week and that most days she would eat bites to nothing.   Patient reports UBW of 164 lb. Per chart review, current weight is 158 lb. This indicates 6 lb weight loss (3.6% body weight). Patient feels that this occurred over the past 1 week. Prior to this admission, the most  recent weight recorded is in May 2019 when she weighed 169 lb.    Medications reviewed; 10 mEq K-Dur BID 1/13 and 1/14. Labs reviewed; Na: 122 mmol/L, Cl: 90 mmol/L, Ca: 8.3 mg/dL.      NUTRITION - FOCUSED PHYSICAL EXAM:    Most Recent Value  Orbital Region  No depletion  Upper Arm Region  No depletion  Thoracic and Lumbar Region  Unable to assess  Buccal Region  No depletion  Temple Region  No depletion  Clavicle Bone Region  No depletion  Clavicle and Acromion Bone Region  No depletion  Scapular Bone Region  No depletion  Dorsal Hand  No depletion  Patellar Region  Unable to assess  Anterior Thigh Region  Unable to assess  Posterior Calf Region  Unable to assess  Edema (RD Assessment)  Unable to assess  Hair  Reviewed  Eyes  Reviewed  Mouth  Reviewed  Skin  Reviewed  Nails  Reviewed       Diet Order:   Diet Order            Diet regular Room service appropriate? Yes; Fluid consistency: Thin  Diet effective now              EDUCATION NEEDS:   No education needs have been identified at this time  Skin:  Skin Assessment: Reviewed RN Assessment  Last BM:  PTA/unknown  Height:   Ht Readings from Last 1 Encounters:  10/08/18 5' (1.524 m)  Weight:   Wt Readings from Last 1 Encounters:  10/08/18 72 kg    Ideal Body Weight:  45.45 kg  BMI:  Body mass index is 31 kg/m.  Estimated Nutritional Needs:   Kcal:  1650-1850 kcal  Protein:  65-75 grams  Fluid:  >/= 1.8 L/day     Jarome Matin, MS, RD, LDN, [facility] Inpatient Clinical Dietitian Pager # [phone] After hours/weekend pager # [phone]

## 2018-10-09 NOTE — Progress Notes (Signed)
PROGRESS NOTE  Alexandra Scott:[id number] DOB: 10-Jul-1946 DOA: 10/08/2018 PCP: Cassandria Anger, MD  HPI/Recap of past 24 hours:  Pleasant elderly female sitting up in bed, report feeling weak, but slightly better Some dry cough, denies chest pain, no hypoxia, no fever Husband at bedside  Assessment/Plan: Principal Problem:   Hyponatremia Active Problems:   Essential hypertension   GERD (gastroesophageal reflux disease)   Influenza B   Hyponatremia.   Sodium 108 on presentation, in the setting of flu, poor oral intake, home meds maxzide held Severe although patient without neurological symptoms.   nephrology  Consulted, fluids management per nephrology, she was briefly on hypertonic saline on presentation.   Hypertension  hold patient's triamterene/hydrochlorothiazide likely contributing to low sodium.   Continue home med lotensin  Influenza B, tamiflu  GERD continue PPI  Body mass index is 31 kg/m.    Code Status: full  Family Communication: patient and husband  Disposition Plan: remain in stepdown   Consultants:  nephrology  Procedures:  none  Antibiotics:  none   Objective: BP (!) 119/54 (BP Location: Left Arm)   Pulse 77   Temp 97.8 F (36.6 C) (Axillary)   Resp 18   Ht 5' (1.524 m)   Wt 72 kg   SpO2 98%   BMI 31.00 kg/m   Intake/Output Summary (Last 24 hours) at 10/09/2018 1753 Last data filed at 10/09/2018 1500 Gross per 24 hour  Intake 440 ml  Output -  Net 440 ml   Filed Weights   10/08/18 0956  Weight: 72 kg    Exam: Patient is examined daily including today on 10/09/2018, exams remain the same as of yesterday except that has changed    General:  NAD  Cardiovascular: RRR  Respiratory: CTABL  Abdomen: Soft/ND/NT, positive BS  Musculoskeletal: No Edema  Neuro: alert, oriented   Data Reviewed: Basic Metabolic Panel: Recent Labs  Lab 10/08/18 1117  10/09/18 0015 10/09/18 0404 10/09/18 0940  10/09/18 1156 10/09/18 1439  NA 108*   < > 120* 120* 122* 121* 123*  K 3.2*   < > 4.1 3.7 3.9 4.3 4.5  CL 71*   < > 87* 90* 90* 89* 90*  CO2 24   < > 25 22 23 24 26   GLUCOSE 114*   < > 91 90 133* 95 107*  BUN 10   < > 7* 8 8 8  7*  CREATININE 0.58   < > 0.64 0.66 0.58 0.59 0.65  CALCIUM 8.3*   < > 8.1* 8.0* 8.3* 8.1* 8.2*  MG 1.8  --   --  1.8  --   --   --   PHOS 2.4*  --   --   --   --   --   --    < > = values in this interval not displayed.   Liver Function Tests: Recent Labs  Lab 10/08/18 1117  AST 49*  ALT 43  ALKPHOS 48  BILITOT 2.0*  PROT 6.9  ALBUMIN 3.7   No results for input(s): LIPASE, AMYLASE in the last 168 hours. No results for input(s): AMMONIA in the last 168 hours. CBC: Recent Labs  Lab 10/08/18 1117 10/08/18 1321 10/09/18 0404  WBC 9.8  --  10.0  NEUTROABS 7.5  --   --   HGB 11.4* 10.5* 10.1*  HCT 31.0* 31.0* 28.8*  MCV 82.4  --  86.2  PLT 281  --  254   Cardiac Enzymes:   Recent  Labs  Lab 10/08/18 1117  TROPONINI <0.03   BNP (last 3 results) No results for input(s): BNP in the last 8760 hours.  ProBNP (last 3 results) No results for input(s): PROBNP in the last 8760 hours.  CBG: No results for input(s): GLUCAP in the last 168 hours.  Recent Results (from the past 240 hour(s))  MRSA PCR Screening     Status: None   Collection Time: 10/08/18  4:47 PM  Result Value Ref Range Status   MRSA by PCR NEGATIVE NEGATIVE Final    Comment:        The GeneXpert MRSA Assay (FDA approved for NASAL specimens only), is one component of a comprehensive MRSA colonization surveillance program. It is not intended to diagnose MRSA infection nor to guide or monitor treatment for MRSA infections. Performed at [facility], [place] [address] [place],  [address]      Studies: No results found.  Scheduled Meds: . aspirin EC  81 mg Oral QHS  . benazepril  40 mg Oral QHS  . enoxaparin (LOVENOX) injection  40 mg  Subcutaneous Q24H  . feeding supplement  1 Container Oral BID BM  . multivitamin with minerals  1 tablet Oral Daily  . oseltamivir  75 mg Oral BID    Continuous Infusions:   Time spent: 46mins I have personally reviewed and interpreted on  10/09/2018 daily labs, tele strips, imagings as discussed above under date review session and assessment and plans.  I reviewed all nursing notes, pharmacy notes, consultant notes,  vitals, pertinent old records  I have discussed plan of care as described above with RN , patient and family on 10/09/2018   Florencia Reasons MD, PhD  [facility] Pager [phone]. If 7PM-7AM, please contact night-coverage at [email or web address], password [facility] 10/09/2018, 5:53 PM  LOS: 1 day

## 2018-10-10 LAB — BASIC METABOLIC PANEL
Anion gap: 9 (ref 5–15)
Anion gap: 9 (ref 5–15)
Anion gap: 8 (ref 5–15)
Anion gap: 10 (ref 5–15)
BUN: 8 mg/dL (ref 8–23)
BUN: 8 mg/dL (ref 8–23)
BUN: 9 mg/dL (ref 8–23)
BUN: 10 mg/dL (ref 8–23)
CO2: 22 mmol/L (ref 22–32)
CO2: 25 mmol/L (ref 22–32)
CO2: 26 mmol/L (ref 22–32)
CO2: 24 mmol/L (ref 22–32)
Calcium: 8 mg/dL — ABNORMAL LOW (ref 8.9–10.3)
Calcium: 8.5 mg/dL — ABNORMAL LOW (ref 8.9–10.3)
Calcium: 8.2 mg/dL — ABNORMAL LOW (ref 8.9–10.3)
Calcium: 8.1 mg/dL — ABNORMAL LOW (ref 8.9–10.3)
Chloride: 92 mmol/L — ABNORMAL LOW (ref 98–111)
Chloride: 91 mmol/L — ABNORMAL LOW (ref 98–111)
Chloride: 91 mmol/L — ABNORMAL LOW (ref 98–111)
Chloride: 91 mmol/L — ABNORMAL LOW (ref 98–111)
Creatinine, Ser: 0.63 mg/dL (ref 0.44–1.00)
Creatinine, Ser: 0.53 mg/dL (ref 0.44–1.00)
Creatinine, Ser: 0.66 mg/dL (ref 0.44–1.00)
Creatinine, Ser: 0.62 mg/dL (ref 0.44–1.00)
GFR calc Af Amer: >60 mL/min (ref >60)
GFR calc non Af Amer: >60 mL/min (ref >60)
GFR calc non Af Amer: >60 mL/min (ref >60)
GFR calc non Af Amer: >60 mL/min (ref >60)
Glucose, Bld: 98 mg/dL (ref 70–99)
Glucose, Bld: 91 mg/dL (ref 70–99)
Glucose, Bld: 118 mg/dL — ABNORMAL HIGH (ref 70–99)
Glucose, Bld: 99 mg/dL (ref 70–99)
Potassium: 4.1 mmol/L (ref 3.5–5.1)
Potassium: 4.4 mmol/L (ref 3.5–5.1)
Potassium: 3.3 mmol/L — ABNORMAL LOW (ref 3.5–5.1)
Potassium: 3.8 mmol/L (ref 3.5–5.1)
SODIUM: 123 mmol/L — AB (ref 135–145)
SODIUM: 125 mmol/L — AB (ref 135–145)
Sodium: 125 mmol/L — ABNORMAL LOW (ref 135–145)
Sodium: 125 mmol/L — ABNORMAL LOW (ref 135–145)

## 2018-10-10 LAB — MAGNESIUM: MAGNESIUM: 1.9 mg/dL (ref 1.7–2.4)

## 2018-10-10 MED ORDER — POTASSIUM CHLORIDE CRYS ER 20 MEQ PO TBCR
40.0000 meq | EXTENDED_RELEASE_TABLET | Freq: Once | ORAL | Status: AC
  Administered 2018-10-10: 40 meq via ORAL
  Filled 2018-10-10: qty 2

## 2018-10-10 NOTE — Progress Notes (Signed)
Subjective:  Sodium slowly improving- up to 125 (baseline appears to be 130-135).  She is sick of being sick- frustrated on needing to be waited on   Objective Vital signs in last 24 hours: Vitals:   10/10/18 0400 10/10/18 0516 10/10/18 0600 10/10/18 0800  BP: (!) 94/57 106/77 123/62 (!) 142/69  Pulse: (!) 59 62 64 79  Resp: 17 15 (!) 22 18  Temp: 97.9 F (36.6 C)   (!) 97.5 F (36.4 C)  TempSrc: Axillary   Oral  SpO2: 97% 100% 99% 98%  Weight:      Height:       Weight change:   Intake/Output Summary (Last 24 hours) at 10/10/2018 1336 Last data filed at 10/09/2018 1500 Gross per 24 hour  Intake 220 ml  Output -  Net 220 ml    Assessment/ Plan: Pt is a 73 y.o. yo female who was admitted on 10/08/2018 with influenza and hyponatremia of 108  Assessment/Plan: 1. Hyponatremia- slowly improved from admission - up from 108 to 125 last 48 hours.  Felt to be due to HCTZ and poor PO intake but possibly also an element of SIADH with transient urinary concentration difficulty. Given saline both normal and hypertonic early on  No current treatment at present - she is drinking because she feels dehydrated- cont to watch- will sodium check q 12 hours.  Baseline sodium appears to be in the low 130's  2. Flu-  Overall feels better- on tamiflu 4. HTN/volume- not overloaded- not signif dry by VS-  She is trying to drink more water and meal supplements  Louis Meckel    Labs: Basic Metabolic Panel: Recent Labs  Lab 10/08/18 1117  10/09/18 2142 10/10/18 0259 10/10/18 0852  NA 108*   < > 122* 123* 125*  K 3.2*   < > 4.2 4.1 4.4  CL 71*   < > 92* 92* 91*  CO2 24   < > 23 22 25   GLUCOSE 114*   < > 101* 98 91  BUN 10   < > 9 8 8   CREATININE 0.58   < > 0.70 0.63 0.53  CALCIUM 8.3*   < > 8.0* 8.0* 8.5*  PHOS 2.4*  --   --   --   --    < > = values in this interval not displayed.   Liver Function Tests: Recent Labs  Lab 10/08/18 1117  AST 49*  ALT 43  ALKPHOS 48  BILITOT 2.0*   PROT 6.9  ALBUMIN 3.7   No results for input(s): LIPASE, AMYLASE in the last 168 hours. No results for input(s): AMMONIA in the last 168 hours. CBC: Recent Labs  Lab 10/08/18 1117 10/08/18 1321 10/09/18 0404  WBC 9.8  --  10.0  NEUTROABS 7.5  --   --   HGB 11.4* 10.5* 10.1*  HCT 31.0* 31.0* 28.8*  MCV 82.4  --  86.2  PLT 281  --  254   Cardiac Enzymes: Recent Labs  Lab 10/08/18 1117  TROPONINI <0.03   CBG: No results for input(s): GLUCAP in the last 168 hours.  Iron Studies: No results for input(s): IRON, TIBC, TRANSFERRIN, FERRITIN in the last 72 hours. Studies/Results: No results found. Medications: Infusions:   Scheduled Medications: . aspirin EC  81 mg Oral QHS  . benazepril  40 mg Oral QHS  . enoxaparin (LOVENOX) injection  40 mg Subcutaneous Q24H  . feeding supplement  1 Container Oral BID BM  . multivitamin with  minerals  1 tablet Oral Daily  . oseltamivir  75 mg Oral BID    have reviewed scheduled and prn medications.  Physical Exam: General: NAD- frustrated with condition- working on computer Heart: RRR Lungs: moslty clear Abdomen: soft, non tender Extremities: no edema    10/10/2018,1:36 PM  LOS: 2 days

## 2018-10-10 NOTE — Progress Notes (Addendum)
PROGRESS NOTE    Alexandra Scott  VVO:[id number] DOB: August 06, 1946 DOA: 10/08/2018 PCP: Cassandria Anger, MD    Brief Narrative: 73 year old with past medical history significant for hypertension on triamterene hydrochlorothiazide, GERD who was admitted with hyponatremia with sodium level at 108 and influenza B.    Assessment & Plan:   Principal Problem:   Hyponatremia Active Problems:   Essential hypertension   GERD (gastroesophageal reflux disease)   Influenza B   1-hyponatremia; thought to be combination of hydrochlorothiazide, poor oral intake and possibility of SIADH  initially on 3% hypertonic saline.  Currently she was changed to IV fluid. Her sodium has been slowly increasing. Her sodium today is of 125. She is off of IV fluids. Appreciate nephrology assistance. Continue to  hold hydrochlorothiazide.  2-influenza B; Continue with Tamiflu. Symptom management.  3-hypertension Continue to hold triamterene hydrochlorothiazide in the setting of hyponatremia. Hold  Benazepril, Systolic  blood pressure soft   Nutrition Problem: Inadequate oral intake Etiology: acute illness, nausea, decreased appetite    Signs/Symptoms: per patient/family report, meal completion < 50%    Interventions: Boost Breeze, MVI  Estimated body mass index is 31 kg/m as calculated from the following:   Height as of this encounter: 5' (1.524 m).   Weight as of this encounter: 72 kg.   DVT prophylaxis: Lovenox Code Status: Full code Family Communication: Discussed with patient Disposition Plan: Transfer to [place].  Repeat renal function.  Consultants:  Nephrology Procedures:    None   Antimicrobials;  None   Subjective: She currently sleep last night she is tired.  Still weak.  Cough persist.  Denies worsening shortness of breath.  Objective: Vitals:   10/10/18 0200 10/10/18 0400 10/10/18 0516 10/10/18 0600  BP: (!) 96/52 (!) 94/57 106/77 123/62  Pulse: 66  (!) 59 62 64  Resp: 16 17 15  (!) 22  Temp:  97.9 F (36.6 C)    TempSrc:  Axillary    SpO2: 97% 97% 100% 99%  Weight:      Height:        Intake/Output Summary (Last 24 hours) at 10/10/2018 0724 Last data filed at 10/09/2018 1500 Gross per 24 hour  Intake 440 ml  Output -  Net 440 ml   Filed Weights   10/08/18 0956  Weight: 72 kg    Examination:  General exam: Appears calm and comfortable  Respiratory system: Lateral rhonchus. Cardiovascular system: S1 & S2 heard, RRR. No JVD, murmurs, rubs, gallops or clicks. No pedal edema. Gastrointestinal system: Abdomen is nondistended, soft and nontender. No organomegaly or masses felt. Normal bowel sounds heard. Central nervous system: Alert and oriented. No focal neurological deficits. Extremities: Symmetric 5 x 5 power. Skin: No rashes, lesions or ulcers Psychiatry: Judgement and insight appear normal. Mood & affect appropriate.     Data Reviewed: I have personally reviewed following labs and imaging studies  CBC: Recent Labs  Lab 10/08/18 1117 10/08/18 1321 10/09/18 0404  WBC 9.8  --  10.0  NEUTROABS 7.5  --   --   HGB 11.4* 10.5* 10.1*  HCT 31.0* 31.0* 28.8*  MCV 82.4  --  86.2  PLT 281  --  269   Basic Metabolic Panel: Recent Labs  Lab 10/08/18 1117  10/09/18 0404 10/09/18 0940 10/09/18 1156 10/09/18 1439 10/09/18 2142 10/10/18 0259  NA 108*   < > 120* 122* 121* 123* 122* 123*  K 3.2*   < > 3.7 3.9 4.3 4.5 4.2 4.1  CL 71*   < >  90* 90* 89* 90* 92* 92*  CO2 24   < > 22 23 24 26 23 22   GLUCOSE 114*   < > 90 133* 95 107* 101* 98  BUN 10   < > 8 8 8  7* 9 8  CREATININE 0.58   < > 0.66 0.58 0.59 0.65 0.70 0.63  CALCIUM 8.3*   < > 8.0* 8.3* 8.1* 8.2* 8.0* 8.0*  MG 1.8  --  1.8  --   --   --   --  1.9  PHOS 2.4*  --   --   --   --   --   --   --    < > = values in this interval not displayed.   GFR: Estimated Creatinine Clearance: 56.3 mL/min (by C-G formula based on SCr of 0.63 mg/dL). Liver Function  Tests: Recent Labs  Lab 10/08/18 1117  AST 49*  ALT 43  ALKPHOS 48  BILITOT 2.0*  PROT 6.9  ALBUMIN 3.7   No results for input(s): LIPASE, AMYLASE in the last 168 hours. No results for input(s): AMMONIA in the last 168 hours. Coagulation Profile: No results for input(s): INR, PROTIME in the last 168 hours. Cardiac Enzymes: Recent Labs  Lab 10/08/18 1117  TROPONINI <0.03   BNP (last 3 results) No results for input(s): PROBNP in the last 8760 hours. HbA1C: No results for input(s): HGBA1C in the last 72 hours. CBG: No results for input(s): GLUCAP in the last 168 hours. Lipid Profile: No results for input(s): CHOL, HDL, LDLCALC, TRIG, CHOLHDL, LDLDIRECT in the last 72 hours. Thyroid Function Tests: Recent Labs    10/08/18 1452  TSH 0.796   Anemia Panel: No results for input(s): VITAMINB12, FOLATE, FERRITIN, TIBC, IRON, RETICCTPCT in the last 72 hours. Sepsis Labs: No results for input(s): PROCALCITON, LATICACIDVEN in the last 168 hours.  Recent Results (from the past 240 hour(s))  MRSA PCR Screening     Status: None   Collection Time: 10/08/18  4:47 PM  Result Value Ref Range Status   MRSA by PCR NEGATIVE NEGATIVE Final    Comment:        The GeneXpert MRSA Assay (FDA approved for NASAL specimens only), is one component of a comprehensive MRSA colonization surveillance program. It is not intended to diagnose MRSA infection nor to guide or monitor treatment for MRSA infections. Performed at [facility], [place] [address] [place], [place] [address]          Radiology Studies: No results found.      Scheduled Meds: . aspirin EC  81 mg Oral QHS  . benazepril  40 mg Oral QHS  . enoxaparin (LOVENOX) injection  40 mg Subcutaneous Q24H  . feeding supplement  1 Container Oral BID BM  . multivitamin with minerals  1 tablet Oral Daily  . oseltamivir  75 mg Oral BID   Continuous Infusions:   LOS: 2 days    Time spent: 35 minutes.      Elmarie Shiley, MD [facility] Pager [phone]  If 7PM-7AM, please contact night-coverage [email or web address] Password [facility] 10/10/2018, 7:24 AM

## 2018-10-11 LAB — BASIC METABOLIC PANEL
Anion gap: 8 (ref 5–15)
BUN: 9 mg/dL (ref 8–23)
CO2: 25 mmol/L (ref 22–32)
Calcium: 8.4 mg/dL — ABNORMAL LOW (ref 8.9–10.3)
Chloride: 93 mmol/L — ABNORMAL LOW (ref 98–111)
Creatinine, Ser: 0.62 mg/dL (ref 0.44–1.00)
GFR calc non Af Amer: >60 mL/min (ref >60)
Glucose, Bld: 87 mg/dL (ref 70–99)
Potassium: 4.3 mmol/L (ref 3.5–5.1)
Sodium: 126 mmol/L — ABNORMAL LOW (ref 135–145)

## 2018-10-11 MED ORDER — OSELTAMIVIR PHOSPHATE 75 MG PO CAPS: 75.0000 mg | ORAL_CAPSULE | Freq: Two times a day (BID) | ORAL | 0 refills | Status: AC

## 2018-10-11 NOTE — Discharge Summary (Signed)
Physician Discharge Summary  Alexandra Scott ZOX:[id number] DOB: 09-Feb-1946 DOA: 10/08/2018  PCP: Cassandria Anger, MD  Admit date: 10/08/2018 Discharge date: 10/11/2018  Admitted From: Home  Disposition: Home   Recommendations for Outpatient Follow-up:  1. Follow up with PCP in 1-2 weeks 2. Please obtain BMP/CBC in 4 days 3. Recommend not to resume HCTZ and Triamterene due to hyponatremia, and risk for recurrence.  4. Ok to resume lisinopril if BP increases and sodium stable. \ 5. Please also repeat phosphorus level  Home Health: none  Discharge Condition; stable.  CODE STATUS; full code.  Diet recommendation: Regular diet   Brief/Interim Summary: 73 year old with past medical history significant for hypertension on triamterene hydrochlorothiazide, GERD who was admitted with hyponatremia with sodium level at 108 and influenza B.  For her hyponatremia she was initially treated with 3% hypertonic saline, subsequently IV fluids.  Her sodium level increase and subsequently fluids were stopped..  In records her sodium is usually around 130.  Her sodium level today is 126.  Discussed case with nephrology okay to discharge with close follow-up for repeated be made. Avoid resumption of hydrochlorothiazide due to increase or recurrence of hyponatremia.  For influenza she has remained stable, afebrile.  He will be discharged on 3 more days of Tamiflu.  Assessment & Plan:   Principal Problem:   Hyponatremia Active Problems:   Essential hypertension   GERD (gastroesophageal reflux disease)   Influenza B   1-hyponatremia; thought to be combination of hydrochlorothiazide, poor oral intake and possibility of SIADH  initially on 3% hypertonic saline.  Currently she was changed to IV fluid. Her sodium has been slowly increasing. Her sodium today is of 126.  She is asymptomatic She is off of IV fluids. Appreciate nephrology assistance. Continue to  hold hydrochlorothiazide  triamterene at discharge.  Discussed  with nephrology stable for discharge.  2-influenza B; Continue with Tamiflu. Symptom management.  3-hypertension Continue to hold triamterene hydrochlorothiazide in the setting of hyponatremia. Hold  Benazepril, Systolic  blood pressure soft  Inadequate oral intake due to acute illness; she has been tolerating diet    Discharge Diagnoses:  Principal Problem:   Hyponatremia Active Problems:   Essential hypertension   GERD (gastroesophageal reflux disease)   Influenza B    Discharge Instructions  Discharge Instructions    Diet general   Complete by:  As directed    Increase activity slowly   Complete by:  As directed      Allergies as of 10/11/2018      Reactions   Augmentin [amoxicillin-pot Clavulanate] Nausea And Vomiting   DID THE REACTION INVOLVE: Swelling of the face/tongue/throat, SOB, or low BP? No Sudden or severe rash/hives, skin peeling, or the inside of the mouth or nose? No Did it require medical treatment? No When did it last happen?Jan 2020 If all above answers are "NO", may proceed with cephalosporin use.   Cortisone Anxiety   Agitation   Prednisone Anxiety   Agitation      Medication List    STOP taking these medications   amoxicillin-clavulanate 875-125 MG tablet Commonly known as:  AUGMENTIN   benazepril 40 MG tablet Commonly known as:  LOTENSIN   nystatin 100000 UNIT/ML suspension Commonly known as:  MYCOSTATIN   triamterene-hydrochlorothiazide 37.5-25 MG tablet Commonly known as:  MAXZIDE-25     TAKE these medications   aspirin 81 MG EC tablet Take 81 mg by mouth at bedtime.   BIOTIN PO Take 1 tablet by mouth at  bedtime.   CALCIUM 600/VITAMIN D 600-400 MG-UNIT Tabs Generic drug:  Calcium Carbonate-Vitamin D3 Take 1 tablet by mouth 2 (two) times daily.   Fish Oil 1000 MG Caps Take 1,000 mg by mouth 2 (two) times daily. Reported on 01/11/2016   HYDROcodone-homatropine 5-1.5 MG/5ML  syrup Commonly known as:  HYCODAN Take 5 mLs by mouth every 8 (eight) hours as needed for cough.   Magnesium 250 MG Tabs Take 250 mg by mouth daily.   ondansetron 4 MG disintegrating tablet Commonly known as:  ZOFRAN ODT Take 1 tablet (4 mg total) by mouth every 8 (eight) hours as needed for nausea or vomiting.   oseltamivir 75 MG capsule Commonly known as:  TAMIFLU Take 1 capsule (75 mg total) by mouth 2 (two) times daily for 3 days.   SYSTANE OP Place 1 drop into both eyes 2 (two) times daily as needed (dry eyes).   Turmeric 500 MG Tabs Take 500 mg by mouth 2 (two) times daily.   vitamin C 500 MG tablet Commonly known as:  ASCORBIC ACID Take 500 mg by mouth at bedtime.      Follow-up Information    Plotnikov, Evie Lacks, MD Follow up in 4 day(s).   Specialty:  Internal Medicine Why:  please follow up with your PCP , you need labs to check sodium level.  Contact information: [address] [place] [address] [phone]          Allergies  Allergen Reactions  . Augmentin [Amoxicillin-Pot Clavulanate] Nausea And Vomiting    DID THE REACTION INVOLVE: Swelling of the face/tongue/throat, SOB, or low BP? No Sudden or severe rash/hives, skin peeling, or the inside of the mouth or nose? No Did it require medical treatment? No When did it last happen?Jan 2020 If all above answers are "NO", may proceed with cephalosporin use.   . Cortisone Anxiety    Agitation  . Prednisone Anxiety    Agitation    Consultations:  Nephrology   Procedures/Studies: Dg Chest 2 View  Result Date: 10/03/2018 CLINICAL DATA:  Cough.  Chest congestion and shortness of breath. EXAM: CHEST - 2 VIEW COMPARISON:  01/11/2016 FINDINGS: The heart size and mediastinal contours are within normal limits. Both lungs are clear. The visualized skeletal structures are unremarkable. IMPRESSION: No active cardiopulmonary disease. Electronically Signed   By: Lorriane Shire M.D.   On: 10/03/2018 11:27       Subjective: She is feeling okay she is frustrated because she is still in the hospital she is not used to be sick. She denies weakness  Discharge Exam: Vitals:   10/10/18 2159 10/11/18 0657  BP: 135/76 128/75  Pulse: 68 74  Resp:  16  Temp:  98.6 F (37 C)  SpO2:  99%   Vitals:   10/10/18 1747 10/10/18 2157 10/10/18 2159 10/11/18 0657  BP: 131/67 94/72 135/76 128/75  Pulse: 70 67 68 74  Resp: 18 16  16   Temp: 97.9 F (36.6 C) (!) 97.5 F (36.4 C)  98.6 F (37 C)  TempSrc: Oral Oral  Oral  SpO2: 99% 99%  99%  Weight:      Height:        General: Pt is alert, awake, not in acute distress Cardiovascular: RRR, S1/S2 +, no rubs, no gallops Respiratory: CTA bilaterally, no wheezing, no rhonchi Abdominal: Soft, NT, ND, bowel sounds + Extremities: no edema, no cyanosis    The results of significant diagnostics from this hospitalization (including imaging, microbiology, ancillary and  laboratory) are listed below for reference.     Microbiology: Recent Results (from the past 240 hour(s))  MRSA PCR Screening     Status: None   Collection Time: 10/08/18  4:47 PM  Result Value Ref Range Status   MRSA by PCR NEGATIVE NEGATIVE Final    Comment:        The GeneXpert MRSA Assay (FDA approved for NASAL specimens only), is one component of a comprehensive MRSA colonization surveillance program. It is not intended to diagnose MRSA infection nor to guide or monitor treatment for MRSA infections. Performed at [facility], [place] [address], [place], [place] [address]      Labs: BNP (last 3 results) No results for input(s): BNP in the last 8760 hours. Basic Metabolic Panel: Recent Labs  Lab 10/08/18 1117  10/09/18 0404  10/10/18 0259 10/10/18 0852 10/10/18 1419 10/10/18 1654 10/11/18 0540  NA 108*   < > 120*   < > 123* 125* 125* 125* 126*  K 3.2*   < > 3.7   < > 4.1 4.4 3.3* 3.8 4.3  CL 71*   < > 90*   < > 92* 91* 91* 91* 93*  CO2 24   <  > 22   < > 22 25 26 24 25   GLUCOSE 114*   < > 90   < > 98 91 118* 99 87  BUN 10   < > 8   < > 8 8 9 10 9   CREATININE 0.58   < > 0.66   < > 0.63 0.53 0.66 0.62 0.62  CALCIUM 8.3*   < > 8.0*   < > 8.0* 8.5* 8.2* 8.1* 8.4*  MG 1.8  --  1.8  --  1.9  --   --   --   --   PHOS 2.4*  --   --   --   --   --   --   --   --    < > = values in this interval not displayed.   Liver Function Tests: Recent Labs  Lab 10/08/18 1117  AST 49*  ALT 43  ALKPHOS 48  BILITOT 2.0*  PROT 6.9  ALBUMIN 3.7   No results for input(s): LIPASE, AMYLASE in the last 168 hours. No results for input(s): AMMONIA in the last 168 hours. CBC: Recent Labs  Lab 10/08/18 1117 10/08/18 1321 10/09/18 0404  WBC 9.8  --  10.0  NEUTROABS 7.5  --   --   HGB 11.4* 10.5* 10.1*  HCT 31.0* 31.0* 28.8*  MCV 82.4  --  86.2  PLT 281  --  254   Cardiac Enzymes: Recent Labs  Lab 10/08/18 1117  TROPONINI <0.03   BNP: Invalid input(s): POCBNP CBG: No results for input(s): GLUCAP in the last 168 hours. D-Dimer No results for input(s): DDIMER in the last 72 hours. Hgb A1c No results for input(s): HGBA1C in the last 72 hours. Lipid Profile No results for input(s): CHOL, HDL, LDLCALC, TRIG, CHOLHDL, LDLDIRECT in the last 72 hours. Thyroid function studies Recent Labs    10/08/18 1452  TSH 0.796   Anemia work up No results for input(s): VITAMINB12, FOLATE, FERRITIN, TIBC, IRON, RETICCTPCT in the last 72 hours. Urinalysis    Component Value Date/Time   COLORURINE YELLOW 10/08/2018 1143   APPEARANCEUR HAZY (A) 10/08/2018 1143   LABSPEC 1.013 10/08/2018 1143   PHURINE 7.0 10/08/2018 1143   GLUCOSEU NEGATIVE 10/08/2018 1143   GLUCOSEU NEGATIVE  02/07/2018 0829   HGBUR NEGATIVE 10/08/2018 1143   Orrum 10/08/2018 1143   KETONESUR 80 (A) 10/08/2018 1143   PROTEINUR NEGATIVE 10/08/2018 1143   UROBILINOGEN 0.2 02/07/2018 0829   NITRITE NEGATIVE 10/08/2018 1143   LEUKOCYTESUR TRACE (A) 10/08/2018 1143    Sepsis Labs Invalid input(s): PROCALCITONIN,  WBC,  LACTICIDVEN Microbiology Recent Results (from the past 240 hour(s))  MRSA PCR Screening     Status: None   Collection Time: 10/08/18  4:47 PM  Result Value Ref Range Status   MRSA by PCR NEGATIVE NEGATIVE Final    Comment:        The GeneXpert MRSA Assay (FDA approved for NASAL specimens only), is one component of a comprehensive MRSA colonization surveillance program. It is not intended to diagnose MRSA infection nor to guide or monitor treatment for MRSA infections. Performed at [facility], [place] [address] [place], [place] [address]      Time coordinating discharge: 35 minutes   SIGNED:   Elmarie Shiley, MD  [facility] 10/11/2018, 9:06 AM Pager   If 7PM-7AM, please contact night-coverage [email or web address] Password [email or web address]

## 2018-10-11 NOTE — Care Management Important Message (Signed)
Important Message  Patient Details  Name: Alexandra Scott MRN: [id number] Date of Birth: 07-Jun-1946   Medicare Important Message Given:  Yes    Kerin Salen 10/11/2018, 10:36 AMImportant Message  Patient Details  Name: Alexandra Scott MRN: [id number] Date of Birth: 1946/04/15   Medicare Important Message Given:  Yes    Kerin Salen 10/11/2018, 10:36 AM

## 2018-10-12 ENCOUNTER — Ambulatory Visit: Payer: Self-pay | Admitting: *Deleted

## 2018-10-12 ENCOUNTER — Telehealth: Payer: Self-pay | Admitting: *Deleted

## 2018-10-12 NOTE — Telephone Encounter (Addendum)
Pt states she had the flu and was in the office on Monday morning and was sent to the ED. Received blood work on Monday and sodium level was dangerously low per ED physician. Pt was discharged from hospital on yesterday. "Feels like I'm not all here, something is not right. Feels like i'm no coherent". Pt states she has been feeling this way since symptoms started originally on Monday. Pt states she was sick all last week. Pt states that the center of belly button is black. Pt also states she did receive a anticoagulant injection while in the hospital. "I don't feel right with talking, thoughts and vision". Pt unable to verbalize any other symptoms at this time. Hospital follow up appt with Dr. Alain Marion scheduled for Wed, 1/22. Pt advised to seek treatment in the ED for current symptoms. Pt verbalized understanding.

## 2018-10-12 NOTE — Telephone Encounter (Signed)
Transition Care Management Follow-up Telephone Call   Date discharged? 10/11/18   How have you been since you were released from the hospital? Pt states she is doing alright    Do you understand why you were in the hospital? YES   Do you understand the discharge instructions?YES   Where were you discharged to? Home   Items Reviewed:  Medications reviewed: YES  Allergies reviewed: YES, she states she is still not taking her HCTZ and Triamterene for her BP. Inform pt to continue holding until she follow-up w/Dr. Alain Marion  Dietary changes reviewed: NO  Referrals reviewed: No referral needed   Functional Questionnaire:   Activities of Daily Living (ADLs):   She states she are independent in the following: ambulation, bathing and hygiene, feeding, continence, grooming, toileting and dressing States she doesn't require assistance    Any transportation issues/concerns?: NO   Any patient concerns? NO   Confirmed importance and date/time of follow-up visits scheduled YES, appt 10/17/18  Provider Appointment booked with Dr. Alain Marion  Confirmed with patient if condition begins to worsen call PCP or go to the ER.  Patient was given the office number and encouraged to call back with question or concerns.  : YES

## 2018-10-12 NOTE — Telephone Encounter (Signed)
  Reason for Disposition . Patient sounds very sick or weak to the triager  Answer Assessment - Initial Assessment Questions 1. SYMPTOM: "What is the main symptom you are concerned about?" (e.g., weakness, numbness)     Pt states she feels like she is not coherent, like she is not here 2. ONSET: "When did this start?" (minutes, hours, days; while sleeping)     Pt states she has been having these symptoms since she was seen in the office on Monday      3. PATTERN "Does this come and go, or has it been constant since it started?"  "Is it present now?"     Pt states it has been occurring since she first got sick on Monday 4. NEUROLOGIC SYMPTOMS: "Have you had any of the following symptoms: headache, dizziness, vision loss, double vision, changes in speech, unsteady on your feet?"     Pt states she feels like her talking, thoughts and vision are diffferent  Protocols used: NEUROLOGIC DEFICIT-A-AH

## 2018-10-17 ENCOUNTER — Encounter: Payer: Self-pay | Admitting: Internal Medicine

## 2018-10-17 ENCOUNTER — Other Ambulatory Visit (INDEPENDENT_AMBULATORY_CARE_PROVIDER_SITE_OTHER): Payer: PPO

## 2018-10-17 ENCOUNTER — Ambulatory Visit (INDEPENDENT_AMBULATORY_CARE_PROVIDER_SITE_OTHER): Payer: PPO | Admitting: Internal Medicine

## 2018-10-17 VITALS — BP 154/86 | HR 92 | Temp 97.8°F | Ht 60.0 in | Wt 156.0 lb

## 2018-10-17 DIAGNOSIS — D649 Anemia, unspecified: Secondary | ICD-10-CM

## 2018-10-17 DIAGNOSIS — R202 Paresthesia of skin: Secondary | ICD-10-CM

## 2018-10-17 DIAGNOSIS — B029 Zoster without complications: Secondary | ICD-10-CM | POA: Insufficient documentation

## 2018-10-17 DIAGNOSIS — R531 Weakness: Secondary | ICD-10-CM

## 2018-10-17 DIAGNOSIS — E871 Hypo-osmolality and hyponatremia: Secondary | ICD-10-CM

## 2018-10-17 DIAGNOSIS — I10 Essential (primary) hypertension: Secondary | ICD-10-CM

## 2018-10-17 DIAGNOSIS — J101 Influenza due to other identified influenza virus with other respiratory manifestations: Secondary | ICD-10-CM

## 2018-10-17 LAB — CBC WITH DIFFERENTIAL/PLATELET
BASOS ABS: 0.1 10*3/uL (ref 0.0–0.1)
Basophils Relative: 0.7 % (ref 0.0–3.0)
Eosinophils Absolute: 0.1 10*3/uL (ref 0.0–0.7)
Eosinophils Relative: 0.5 % (ref 0.0–5.0)
HCT: 34.5 % — ABNORMAL LOW (ref 36.0–46.0)
Hemoglobin: 11.9 g/dL — ABNORMAL LOW (ref 12.0–15.0)
Lymphocytes Relative: 7.4 % — ABNORMAL LOW (ref 12.0–46.0)
Lymphs Abs: 0.8 10*3/uL (ref 0.7–4.0)
MCHC: 34.3 g/dL (ref 30.0–36.0)
MCV: 89.2 fl (ref 78.0–100.0)
MONOS PCT: 12.9 % — AB (ref 3.0–12.0)
Monocytes Absolute: 1.3 10*3/uL — ABNORMAL HIGH (ref 0.1–1.0)
Neutro Abs: 8 10*3/uL — ABNORMAL HIGH (ref 1.4–7.7)
Neutrophils Relative %: 78.5 % — ABNORMAL HIGH (ref 43.0–77.0)
Platelets: 362 10*3/uL (ref 150.0–400.0)
RBC: 3.87 Mil/uL (ref 3.87–5.11)
RDW: 13.5 % (ref 11.5–15.5)
WBC: 10.1 10*3/uL (ref 4.0–10.5)

## 2018-10-17 LAB — HEPATIC FUNCTION PANEL
ALT: 25 U/L (ref 0–35)
AST: 21 U/L (ref 0–37)
Albumin: 3.9 g/dL (ref 3.5–5.2)
Alkaline Phosphatase: 55 U/L (ref 39–117)
Bilirubin, Direct: 0.3 mg/dL (ref 0.0–0.3)
Total Bilirubin: 0.8 mg/dL (ref 0.2–1.2)
Total Protein: 6.8 g/dL (ref 6.0–8.3)

## 2018-10-17 LAB — BASIC METABOLIC PANEL
BUN: 10 mg/dL (ref 6–23)
CO2: 31 mEq/L (ref 19–32)
Calcium: 9.4 mg/dL (ref 8.4–10.5)
Chloride: 93 mEq/L — ABNORMAL LOW (ref 96–112)
Creatinine, Ser: 0.61 mg/dL (ref 0.40–1.20)
GFR: 96.36 mL/min (ref >60.00)
Glucose, Bld: 111 mg/dL — ABNORMAL HIGH (ref 70–99)
Potassium: 4.3 mEq/L (ref 3.5–5.1)
SODIUM: 128 meq/L — AB (ref 135–145)

## 2018-10-17 MED ORDER — VALACYCLOVIR HCL 1 G PO TABS: 1000.0000 mg | ORAL_TABLET | Freq: Three times a day (TID) | ORAL | 0 refills | Status: DC

## 2018-10-17 MED ORDER — BENAZEPRIL HCL 40 MG PO TABS: 40.0000 mg | ORAL_TABLET | Freq: Every day | ORAL | 11 refills | Status: DC

## 2018-10-17 NOTE — Progress Notes (Signed)
Subjective:  Patient ID: Alexandra Scott, female    DOB: [birth year]  Age: 73 y.o. MRN: [id number]  CC: No chief complaint on file.   HPI AAMORI MCMASTERS presents for hyponatremia, influenza, HTN f/u. She was confused. She was d/c'd on 10/11/18. Per hx: "Recommendations for Outpatient Follow-up:  1. Follow up with PCP in 1-2 weeks 2. Please obtain BMP/CBC in 4 days 3. Recommend not to resume HCTZ and Triamterene due to hyponatremia, and risk for recurrence.  4. Ok to resume lisinopril if BP increases and sodium stable. \ 5. Please also repeat phosphorus level  Home Health: none  Discharge Condition; stable.  CODE STATUS; full code.  Diet recommendation: Regular diet   Brief/Interim Summary: 73 year old with past medical history significant for hypertension on triamterene hydrochlorothiazide, GERD who was admitted with hyponatremia with sodium level at 108 and influenza B.  For her hyponatremia she was initially treated with 3% hypertonic saline, subsequently IV fluids.  Her sodium level increase and subsequently fluids were stopped..  In records her sodium is usually around 130.  Her sodium level today is 126.  Discussed case with nephrology okay to discharge with close follow-up for repeated be made. Avoid resumption of hydrochlorothiazide due to increase or recurrence of hyponatremia.  For influenza she has remained stable, afebrile.  He will be discharged on 3 more days of Tamiflu.  Assessment & Plan:  Principal Problem: Hyponatremia Active Problems: Essential hypertension GERD (gastroesophageal reflux disease) Influenza B   1-hyponatremia;thought to be combination of hydrochlorothiazide, poor oral intake and possibility of SIADH initially on 3% hypertonic saline.Currently she was changed to IV fluid. Her sodium has been slowly increasing. Her sodium today is of 126.  She is asymptomatic She is off of IV fluids. Appreciate nephrology  assistance. Continue tohold hydrochlorothiazide triamterene at discharge.  Discussed  with nephrology stable for discharge.  2-influenza B; Continue with Tamiflu. Symptom management.  3-hypertension Continue to hold triamterene hydrochlorothiazide in the setting of hyponatremia. HoldBenazepril, Systolicblood pressure soft  Inadequate oral intake due to acute illness; she has been tolerating diet    Discharge Diagnoses:  Principal Problem:   Hyponatremia Active Problems:   Essential hypertension   GERD (gastroesophageal reflux disease)   Influenza B    Discharge Instructions  Discharge Instructions    Diet general   Complete by:  As directed    Increase activity slowly   Complete by:  As directed     "  C/o R posterior arm pain - saw Dr Wynelle Link - tear   Outpatient Medications Prior to Visit  Medication Sig Dispense Refill  . aspirin 81 MG EC tablet Take 81 mg by mouth at bedtime.     Marland Kitchen BIOTIN PO Take 1 tablet by mouth at bedtime.    . Multiple Vitamins-Minerals (TH COMPLETE MULTI 50+ PO) Take by mouth.    Vladimir Faster Glycol-Propyl Glycol (SYSTANE OP) Place 1 drop into both eyes 2 (two) times daily as needed (dry eyes).    . Turmeric 500 MG TABS Take 500 mg by mouth 2 (two) times daily.     . Calcium Carbonate-Vitamin D3 (CALCIUM 600/VITAMIN D) 600-400 MG-UNIT TABS Take 1 tablet by mouth 2 (two) times daily.     . Magnesium 250 MG TABS Take 250 mg by mouth daily.     . Omega-3 Fatty Acids (FISH OIL) 1000 MG CAPS Take 1,000 mg by mouth 2 (two) times daily. Reported on 01/11/2016    . vitamin C (ASCORBIC ACID) 500  MG tablet Take 500 mg by mouth at bedtime.     Marland Kitchen HYDROcodone-homatropine (HYCODAN) 5-1.5 MG/5ML syrup Take 5 mLs by mouth every 8 (eight) hours as needed for cough. (Patient not taking: Reported on 10/17/2018) 120 mL 0  . ondansetron (ZOFRAN ODT) 4 MG disintegrating tablet Take 1 tablet (4 mg total) by mouth every 8 (eight) hours as needed  for nausea or vomiting. (Patient not taking: Reported on 10/17/2018) 20 tablet 0   No facility-administered medications prior to visit.     ROS: Review of Systems  Constitutional: Positive for fatigue. Negative for activity change, appetite change, chills and unexpected weight change.  HENT: Negative for congestion, mouth sores and sinus pressure.   Eyes: Negative for visual disturbance.  Respiratory: Negative for cough and chest tightness.   Cardiovascular: Negative for chest pain.  Gastrointestinal: Positive for constipation and nausea. Negative for abdominal distention, abdominal pain, diarrhea and vomiting.  Genitourinary: Negative for difficulty urinating, frequency and vaginal pain.  Musculoskeletal: Positive for back pain. Negative for gait problem.  Skin: Positive for rash. Negative for pallor.  Neurological: Positive for dizziness and weakness. Negative for tremors, numbness and headaches.  Psychiatric/Behavioral: Negative for confusion and sleep disturbance.    Objective:  BP (!) 154/86 (BP Location: Left Arm, Patient Position: Sitting, Cuff Size: Normal)   Pulse 92   Temp 97.8 F (36.6 C) (Oral)   Ht 5' (1.524 m)   Wt 156 lb (70.8 kg)   SpO2 98%   BMI 30.47 kg/m   BP Readings from Last 3 Encounters:  10/17/18 (!) 154/86  10/11/18 128/75  10/08/18 (!) 154/76    Wt Readings from Last 3 Encounters:  10/17/18 156 lb (70.8 kg)  10/08/18 158 lb 11.7 oz (72 kg)  10/08/18 159 lb 1.3 oz (72.2 kg)    Physical Exam Constitutional:      General: She is not in acute distress.    Appearance: She is well-developed.  HENT:     Head: Normocephalic.     Right Ear: External ear normal.     Left Ear: External ear normal.     Nose: Nose normal.  Eyes:     General:        Right eye: No discharge.        Left eye: No discharge.     Conjunctiva/sclera: Conjunctivae normal.     Pupils: Pupils are equal, round, and reactive to light.  Neck:     Musculoskeletal: Normal range  of motion and neck supple.     Thyroid: No thyromegaly.     Vascular: No JVD.     Trachea: No tracheal deviation.  Cardiovascular:     Rate and Rhythm: Normal rate and regular rhythm.     Heart sounds: Normal heart sounds.  Pulmonary:     Effort: No respiratory distress.     Breath sounds: No stridor. No wheezing.  Abdominal:     General: Bowel sounds are normal. There is no distension.     Palpations: Abdomen is soft. There is no mass.     Tenderness: There is no abdominal tenderness. There is no guarding or rebound.  Musculoskeletal:        General: No tenderness.  Lymphadenopathy:     Cervical: No cervical adenopathy.  Skin:    Findings: No erythema or rash.  Neurological:     Cranial Nerves: No cranial nerve deficit.     Motor: No abnormal muscle tone.     Coordination: Coordination normal.  Deep Tendon Reflexes: Reflexes normal.  Psychiatric:        Behavior: Behavior normal.        Thought Content: Thought content normal.        Judgment: Judgment normal.    Blisters on R buttock Looks tired  Lab Results  Component Value Date   WBC 10.0 10/09/2018   HGB 10.1 (L) 10/09/2018   HCT 28.8 (L) 10/09/2018   PLT 254 10/09/2018   GLUCOSE 87 10/11/2018   CHOL 147 02/07/2018   TRIG 70.0 02/07/2018   HDL 44.30 02/07/2018   LDLDIRECT 150.6 10/15/2007   LDLCALC 89 02/07/2018   ALT 43 10/08/2018   AST 49 (H) 10/08/2018   NA 126 (L) 10/11/2018   K 4.3 10/11/2018   CL 93 (L) 10/11/2018   CREATININE 0.62 10/11/2018   BUN 9 10/11/2018   CO2 25 10/11/2018   TSH 0.796 10/08/2018   INR 0.97 03/29/2011    No results found.  Assessment & Plan:   There are no diagnoses linked to this encounter.   No orders of the defined types were placed in this encounter.    Follow-up: No follow-ups on file.  Walker Kehr, MD

## 2018-10-17 NOTE — Assessment & Plan Note (Signed)
Labs

## 2018-10-17 NOTE — Assessment & Plan Note (Signed)
Off Maxzide now

## 2018-10-17 NOTE — Assessment & Plan Note (Addendum)
post influenza B Labs

## 2018-10-17 NOTE — Assessment & Plan Note (Signed)
Treated Flu shot later

## 2018-10-17 NOTE — Assessment & Plan Note (Signed)
Valtrex

## 2018-10-17 NOTE — Telephone Encounter (Signed)
Noted pt has appt today.

## 2018-10-18 LAB — IRON,TIBC AND FERRITIN PANEL
%SAT: 20 % (calc) (ref 16–45)
Ferritin: 316 ng/mL — ABNORMAL HIGH (ref 16–288)
Iron: 54 ug/dL (ref 45–160)
TIBC: 266 mcg/dL (calc) (ref 250–450)

## 2018-10-18 LAB — VITAMIN B12: Vitamin B-12: 597 pg/mL (ref 211–911)

## 2018-10-26 ENCOUNTER — Other Ambulatory Visit: Payer: Self-pay | Admitting: Family

## 2018-10-26 NOTE — Telephone Encounter (Signed)
Patient would like a refill on  Nystatin 100000 UNIT/ML because she still has a good amount of white stuff on her tongue. Does she need to come back in or can she just have the refill? Please Advise. Cell phone # [phone]

## 2018-10-26 NOTE — Telephone Encounter (Signed)
Okay to refill but will need to be seen if persists after second round.

## 2018-10-26 NOTE — Telephone Encounter (Signed)
Called and left message for patient regarding refill and that she would need to be seen if no improvement after this.

## 2018-10-26 NOTE — Telephone Encounter (Signed)
Do not see on med list, please advise

## 2018-10-30 ENCOUNTER — Other Ambulatory Visit: Payer: Self-pay | Admitting: Family

## 2018-10-31 ENCOUNTER — Other Ambulatory Visit: Payer: Self-pay | Admitting: Family

## 2018-10-31 MED ORDER — NYSTATIN 100000 UNIT/ML MT SUSP: OROMUCOSAL | 0 refills | Status: DC

## 2018-11-05 ENCOUNTER — Other Ambulatory Visit: Payer: Self-pay | Admitting: Pharmacist

## 2018-11-05 NOTE — Patient Outreach (Signed)
[facility]) Care Management  11/05/2018  ANNALISIA INGBER 1946-02-21 [id number]  73 year old female outreached by [facility] services for a 30 day post discharge medication review.  PMHx includes, but not limited to, hypertension and GERD.   Unsuccessful outreach attempt #1 with no answer and unable to leave voicemail.   I will route note for [facility] Joetta Manners to make another outreach attempt to patient within 3-4 business days.   Gwenlyn Found, [name] D PGY1 Pharmacy Resident  Phone [phone] 11/05/2018   11:50 AM

## 2018-11-12 DIAGNOSIS — Z1231 Encounter for screening mammogram for malignant neoplasm of breast: Secondary | ICD-10-CM | POA: Diagnosis not present

## 2018-11-12 DIAGNOSIS — Z803 Family history of malignant neoplasm of breast: Secondary | ICD-10-CM | POA: Diagnosis not present

## 2018-11-12 LAB — HM MAMMOGRAPHY

## 2018-11-20 ENCOUNTER — Encounter: Payer: Self-pay | Admitting: Internal Medicine

## 2018-11-20 ENCOUNTER — Ambulatory Visit (INDEPENDENT_AMBULATORY_CARE_PROVIDER_SITE_OTHER)
Admission: RE | Admit: 2018-11-20 | Discharge: 2018-11-20 | Disposition: A | Discharge status: Home / Self Care | Payer: PPO | Source: Ambulatory Visit | Attending: Internal Medicine | Admitting: Internal Medicine

## 2018-11-20 ENCOUNTER — Other Ambulatory Visit (INDEPENDENT_AMBULATORY_CARE_PROVIDER_SITE_OTHER): Payer: PPO

## 2018-11-20 ENCOUNTER — Ambulatory Visit (INDEPENDENT_AMBULATORY_CARE_PROVIDER_SITE_OTHER): Payer: PPO | Admitting: Internal Medicine

## 2018-11-20 VITALS — BP 144/82 | HR 87 | Temp 98.7°F | Ht 60.0 in | Wt 157.0 lb

## 2018-11-20 DIAGNOSIS — R270 Ataxia, unspecified: Secondary | ICD-10-CM

## 2018-11-20 DIAGNOSIS — R42 Dizziness and giddiness: Secondary | ICD-10-CM

## 2018-11-20 DIAGNOSIS — R29898 Other symptoms and signs involving the musculoskeletal system: Secondary | ICD-10-CM

## 2018-11-20 DIAGNOSIS — G3281 Cerebellar ataxia in diseases classified elsewhere: Secondary | ICD-10-CM

## 2018-11-20 LAB — CBC WITH DIFFERENTIAL/PLATELET
Basophils Absolute: 0.1 10*3/uL (ref 0.0–0.1)
Basophils Relative: 1.3 % (ref 0.0–3.0)
Eosinophils Absolute: 0.2 10*3/uL (ref 0.0–0.7)
Eosinophils Relative: 2.2 % (ref 0.0–5.0)
HCT: 38.1 % (ref 36.0–46.0)
Hemoglobin: 12.8 g/dL (ref 12.0–15.0)
Lymphocytes Relative: 17.7 % (ref 12.0–46.0)
Lymphs Abs: 1.6 10*3/uL (ref 0.7–4.0)
MCHC: 33.6 g/dL (ref 30.0–36.0)
MCV: 92.4 fl (ref 78.0–100.0)
Monocytes Absolute: 1.2 10*3/uL — ABNORMAL HIGH (ref 0.1–1.0)
Monocytes Relative: 12.7 % — ABNORMAL HIGH (ref 3.0–12.0)
Neutro Abs: 6.1 10*3/uL (ref 1.4–7.7)
Neutrophils Relative %: 66.1 % (ref 43.0–77.0)
Platelets: 273 10*3/uL (ref 150.0–400.0)
RBC: 4.12 Mil/uL (ref 3.87–5.11)
RDW: 15.3 % (ref 11.5–15.5)
WBC: 9.3 10*3/uL (ref 4.0–10.5)

## 2018-11-20 LAB — BASIC METABOLIC PANEL
BUN: 15 mg/dL (ref 6–23)
CO2: 27 mEq/L (ref 19–32)
Calcium: 9.6 mg/dL (ref 8.4–10.5)
Chloride: 97 mEq/L (ref 96–112)
Creatinine, Ser: 0.74 mg/dL (ref 0.40–1.20)
GFR: 77.09 mL/min (ref >60.00)
Glucose, Bld: 92 mg/dL (ref 70–99)
Potassium: 3.5 mEq/L (ref 3.5–5.1)
Sodium: 136 mEq/L (ref 135–145)

## 2018-11-20 MED ORDER — ALPRAZOLAM 0.5 MG PO TABS: ORAL_TABLET | ORAL | 0 refills | Status: DC

## 2018-11-20 NOTE — Assessment & Plan Note (Signed)
New Brain scan

## 2018-11-20 NOTE — Assessment & Plan Note (Addendum)
Brain scan May need a lumbar MRI Labs LS Xray

## 2018-11-20 NOTE — Progress Notes (Signed)
Subjective:  Patient ID: Alexandra Scott, female    DOB: 24-Dec-1945  Age: 73 y.o. MRN: [id number]  CC: No chief complaint on file.   HPI Alexandra Scott presents for hyponatremia, anemia and LBP f/u C/o weakness in the R leg - it started after jan 2020. C/o unsteady gait...  Outpatient Medications Prior to Visit  Medication Sig Dispense Refill  . aspirin 81 MG EC tablet Take 81 mg by mouth at bedtime.     . benazepril (LOTENSIN) 40 MG tablet Take 1 tablet (40 mg total) by mouth daily. 30 tablet 11  . BIOTIN PO Take 1 tablet by mouth at bedtime.    . Calcium Carbonate-Vitamin D3 (CALCIUM 600/VITAMIN D) 600-400 MG-UNIT TABS Take 1 tablet by mouth 2 (two) times daily.     . Magnesium 250 MG TABS Take 250 mg by mouth daily.     . Multiple Vitamins-Minerals (TH COMPLETE MULTI 50+ PO) Take by mouth.    . Omega-3 Fatty Acids (FISH OIL) 1000 MG CAPS Take 1,000 mg by mouth 2 (two) times daily. Reported on 01/11/2016    . Polyethyl Glycol-Propyl Glycol (SYSTANE OP) Place 1 drop into both eyes 2 (two) times daily as needed (dry eyes).    . Turmeric 500 MG TABS Take 500 mg by mouth 2 (two) times daily.     . vitamin C (ASCORBIC ACID) 500 MG tablet Take 500 mg by mouth at bedtime.     Marland Kitchen nystatin (MYCOSTATIN) 100000 UNIT/ML suspension TAKE  5 ML BY MOUTH 4 TIMES DAILY. GARGLE  AND  SPIT  FOR  THRUSH (Patient not taking: Reported on 11/20/2018) 120 mL 0  . valACYclovir (VALTREX) 1000 MG tablet Take 1 tablet (1,000 mg total) by mouth 3 (three) times daily. (Patient not taking: Reported on 11/20/2018) 21 tablet 0   No facility-administered medications prior to visit.     ROS: Review of Systems  Constitutional: Positive for fatigue. Negative for activity change, appetite change, chills and unexpected weight change.  HENT: Negative for congestion, mouth sores and sinus pressure.   Eyes: Negative for visual disturbance.  Respiratory: Negative for cough and chest tightness.   Gastrointestinal:  Negative for abdominal pain and nausea.  Genitourinary: Negative for difficulty urinating, frequency and vaginal pain.  Musculoskeletal: Positive for arthralgias, back pain and gait problem.  Skin: Negative for pallor and rash.  Neurological: Positive for dizziness, weakness and light-headedness. Negative for tremors, numbness and headaches.  Psychiatric/Behavioral: Negative for confusion, sleep disturbance and suicidal ideas.    Objective:  BP (!) 144/82 (BP Location: Left Arm, Patient Position: Sitting, Cuff Size: Normal)   Pulse 87   Temp 98.7 F (37.1 C) (Oral)   Ht 5' (1.524 m)   Wt 157 lb (71.2 kg)   SpO2 98%   BMI 30.66 kg/m   BP Readings from Last 3 Encounters:  11/20/18 (!) 144/82  10/17/18 (!) 154/86  10/11/18 128/75    Wt Readings from Last 3 Encounters:  11/20/18 157 lb (71.2 kg)  10/17/18 156 lb (70.8 kg)  10/08/18 158 lb 11.7 oz (72 kg)    Physical Exam Constitutional:      General: She is not in acute distress.    Appearance: She is well-developed.  HENT:     Head: Normocephalic.     Right Ear: External ear normal.     Left Ear: External ear normal.     Nose: Nose normal.  Eyes:     General:  Right eye: No discharge.        Left eye: No discharge.     Conjunctiva/sclera: Conjunctivae normal.     Pupils: Pupils are equal, round, and reactive to light.  Neck:     Musculoskeletal: Normal range of motion and neck supple.     Thyroid: No thyromegaly.     Vascular: No JVD.     Trachea: No tracheal deviation.  Cardiovascular:     Rate and Rhythm: Normal rate and regular rhythm.     Heart sounds: Normal heart sounds.  Pulmonary:     Effort: No respiratory distress.     Breath sounds: No stridor. No wheezing.  Abdominal:     General: Bowel sounds are normal. There is no distension.     Palpations: Abdomen is soft. There is no mass.     Tenderness: There is no abdominal tenderness. There is no guarding or rebound.  Musculoskeletal:         General: No tenderness.  Lymphadenopathy:     Cervical: No cervical adenopathy.  Skin:    Findings: No erythema or rash.  Neurological:     Cranial Nerves: No cranial nerve deficit.     Motor: Weakness present. No abnormal muscle tone.     Coordination: Coordination abnormal.     Gait: Gait abnormal.     Deep Tendon Reflexes: Reflexes normal.  Psychiatric:        Behavior: Behavior normal.        Thought Content: Thought content normal.        Judgment: Judgment normal.    Ataxic Romberg (-) RLE muscle strength 5-/5 A/o/c   Lab Results  Component Value Date   WBC 10.1 10/17/2018   HGB 11.9 (L) 10/17/2018   HCT 34.5 (L) 10/17/2018   PLT 362.0 10/17/2018   GLUCOSE 111 (H) 10/17/2018   CHOL 147 02/07/2018   TRIG 70.0 02/07/2018   HDL 44.30 02/07/2018   LDLDIRECT 150.6 10/15/2007   LDLCALC 89 02/07/2018   ALT 25 10/17/2018   AST 21 10/17/2018   NA 128 (L) 10/17/2018   K 4.3 10/17/2018   CL 93 (L) 10/17/2018   CREATININE 0.61 10/17/2018   BUN 10 10/17/2018   CO2 31 10/17/2018   TSH 0.796 10/08/2018   INR 0.97 03/29/2011    No results found.  Assessment & Plan:   There are no diagnoses linked to this encounter.   No orders of the defined types were placed in this encounter.    Follow-up: No follow-ups on file.  Walker Kehr, MD

## 2018-11-20 NOTE — Assessment & Plan Note (Signed)
Brain scan

## 2018-11-21 DIAGNOSIS — M25511 Pain in right shoulder: Secondary | ICD-10-CM | POA: Diagnosis not present

## 2018-11-22 ENCOUNTER — Encounter: Payer: Self-pay | Admitting: Internal Medicine

## 2018-11-22 NOTE — Progress Notes (Signed)
Outside notes received. Information abstracted. Notes sent to scan.  

## 2018-11-27 ENCOUNTER — Ambulatory Visit (INDEPENDENT_AMBULATORY_CARE_PROVIDER_SITE_OTHER): Payer: PPO | Admitting: Internal Medicine

## 2018-11-27 ENCOUNTER — Other Ambulatory Visit: Payer: Self-pay | Admitting: Internal Medicine

## 2018-11-27 ENCOUNTER — Encounter: Payer: Self-pay | Admitting: Internal Medicine

## 2018-11-27 ENCOUNTER — Other Ambulatory Visit (INDEPENDENT_AMBULATORY_CARE_PROVIDER_SITE_OTHER): Payer: PPO

## 2018-11-27 DIAGNOSIS — R531 Weakness: Secondary | ICD-10-CM

## 2018-11-27 DIAGNOSIS — R270 Ataxia, unspecified: Secondary | ICD-10-CM

## 2018-11-27 DIAGNOSIS — E871 Hypo-osmolality and hyponatremia: Secondary | ICD-10-CM | POA: Diagnosis not present

## 2018-11-27 DIAGNOSIS — F439 Reaction to severe stress, unspecified: Secondary | ICD-10-CM | POA: Diagnosis not present

## 2018-11-27 DIAGNOSIS — R682 Dry mouth, unspecified: Secondary | ICD-10-CM | POA: Diagnosis not present

## 2018-11-27 LAB — BASIC METABOLIC PANEL
BUN: 12 mg/dL (ref 6–23)
CHLORIDE: 94 meq/L — AB (ref 96–112)
CO2: 30 mEq/L (ref 19–32)
Calcium: 10 mg/dL (ref 8.4–10.5)
Creatinine, Ser: 0.61 mg/dL (ref 0.40–1.20)
GFR: 96.33 mL/min (ref >60.00)
Glucose, Bld: 104 mg/dL — ABNORMAL HIGH (ref 70–99)
Potassium: 3.9 mEq/L (ref 3.5–5.1)
Sodium: 132 mEq/L — ABNORMAL LOW (ref 135–145)

## 2018-11-27 LAB — CORTISOL: Cortisol, Plasma: 20.5 ug/dL

## 2018-11-27 NOTE — Assessment & Plan Note (Signed)
labs

## 2018-11-27 NOTE — Assessment & Plan Note (Signed)
Discussed stress 

## 2018-11-27 NOTE — Progress Notes (Signed)
Subjective:  Patient ID: Alexandra Scott, female    DOB: 05/08/1946  Age: 73 y.o. MRN: [id number]  CC: No chief complaint on file.   HPI Alexandra Scott presents for dry mouth, fatigue, dizziness, being jittery after her hospital stay... Co a thrush  Outpatient Medications Prior to Visit  Medication Sig Dispense Refill  . ALPRAZolam (XANAX) 0.5 MG tablet Take 1 tab 1 hour prior to the MRI. Repeat in 1 hr if needed 6 tablet 0  . aspirin 81 MG EC tablet Take 81 mg by mouth at bedtime.     . benazepril (LOTENSIN) 40 MG tablet Take 1 tablet (40 mg total) by mouth daily. 30 tablet 11  . BIOTIN PO Take 1 tablet by mouth at bedtime.    . Calcium Carbonate-Vitamin D3 (CALCIUM 600/VITAMIN D) 600-400 MG-UNIT TABS Take 1 tablet by mouth 2 (two) times daily.     . Magnesium 250 MG TABS Take 250 mg by mouth daily.     . Multiple Vitamins-Minerals (TH COMPLETE MULTI 50+ PO) Take by mouth.    . Omega-3 Fatty Acids (FISH OIL) 1000 MG CAPS Take 1,000 mg by mouth 2 (two) times daily. Reported on 01/11/2016    . Polyethyl Glycol-Propyl Glycol (SYSTANE OP) Place 1 drop into both eyes 2 (two) times daily as needed (dry eyes).    . Turmeric 500 MG TABS Take 500 mg by mouth 2 (two) times daily.     . vitamin C (ASCORBIC ACID) 500 MG tablet Take 500 mg by mouth at bedtime.      No facility-administered medications prior to visit.     ROS: Review of Systems  Constitutional: Positive for fatigue. Negative for activity change, appetite change, chills and unexpected weight change.  HENT: Negative for congestion, mouth sores and sinus pressure.   Eyes: Negative for visual disturbance.  Respiratory: Negative for cough and chest tightness.   Gastrointestinal: Negative for abdominal pain and nausea.  Genitourinary: Negative for difficulty urinating, frequency and vaginal pain.  Musculoskeletal: Positive for gait problem. Negative for back pain.  Skin: Negative for pallor and rash.  Neurological: Positive  for weakness. Negative for dizziness, tremors, numbness and headaches.  Psychiatric/Behavioral: Negative for confusion and sleep disturbance. The patient is nervous/anxious.   dry mouth  Objective:  BP (!) 146/88 (BP Location: Left Arm, Patient Position: Sitting, Cuff Size: Normal)   Pulse 78   Temp 97.9 F (36.6 C) (Oral)   Ht 5' (1.524 m)   Wt 157 lb (71.2 kg)   SpO2 99%   BMI 30.66 kg/m   BP Readings from Last 3 Encounters:  11/27/18 (!) 146/88  11/20/18 (!) 144/82  10/17/18 (!) 154/86    Wt Readings from Last 3 Encounters:  11/27/18 157 lb (71.2 kg)  11/20/18 157 lb (71.2 kg)  10/17/18 156 lb (70.8 kg)    Physical Exam Constitutional:      General: She is not in acute distress.    Appearance: She is well-developed.  HENT:     Head: Normocephalic.     Right Ear: External ear normal.     Left Ear: External ear normal.     Nose: Nose normal.  Eyes:     General:        Right eye: No discharge.        Left eye: No discharge.     Conjunctiva/sclera: Conjunctivae normal.     Pupils: Pupils are equal, round, and reactive to light.  Neck:  Musculoskeletal: Normal range of motion and neck supple.     Thyroid: No thyromegaly.     Vascular: No JVD.     Trachea: No tracheal deviation.  Cardiovascular:     Rate and Rhythm: Normal rate and regular rhythm.     Heart sounds: Normal heart sounds.  Pulmonary:     Effort: No respiratory distress.     Breath sounds: No stridor. No wheezing.  Abdominal:     General: Bowel sounds are normal. There is no distension.     Palpations: Abdomen is soft. There is no mass.     Tenderness: There is no abdominal tenderness. There is no guarding or rebound.  Musculoskeletal:        General: No tenderness.  Lymphadenopathy:     Cervical: No cervical adenopathy.  Skin:    Findings: No erythema or rash.  Neurological:     Cranial Nerves: No cranial nerve deficit.     Motor: No abnormal muscle tone.     Coordination: Coordination  abnormal.     Deep Tendon Reflexes: Reflexes normal.  Psychiatric:        Behavior: Behavior normal.        Thought Content: Thought content normal.        Judgment: Judgment normal.   anxious Ataxic a little No thrush  Lab Results  Component Value Date   WBC 9.3 11/20/2018   HGB 12.8 11/20/2018   HCT 38.1 11/20/2018   PLT 273.0 11/20/2018   GLUCOSE 92 11/20/2018   CHOL 147 02/07/2018   TRIG 70.0 02/07/2018   HDL 44.30 02/07/2018   LDLDIRECT 150.6 10/15/2007   LDLCALC 89 02/07/2018   ALT 25 10/17/2018   AST 21 10/17/2018   NA 136 11/20/2018   K 3.5 11/20/2018   CL 97 11/20/2018   CREATININE 0.74 11/20/2018   BUN 15 11/20/2018   CO2 27 11/20/2018   TSH 0.796 10/08/2018   INR 0.97 03/29/2011    No results found.  Assessment & Plan:   There are no diagnoses linked to this encounter.   No orders of the defined types were placed in this encounter.    Follow-up: No follow-ups on file.  Walker Kehr, MD

## 2018-11-27 NOTE — Assessment & Plan Note (Signed)
Use [employer] Peroxicare tooth paste Labs

## 2018-11-27 NOTE — Patient Instructions (Addendum)
Use [employer] Peroxicare tooth paste  Valerian root for anxiety

## 2018-11-27 NOTE — Assessment & Plan Note (Signed)
Brain MRI pending next week

## 2018-11-27 NOTE — Assessment & Plan Note (Signed)
BMET Cortisol

## 2018-11-30 ENCOUNTER — Ambulatory Visit
Admission: RE | Admit: 2018-11-30 | Discharge: 2018-11-30 | Disposition: A | Discharge status: Home / Self Care | Payer: PPO | Source: Ambulatory Visit | Attending: Internal Medicine | Admitting: Internal Medicine

## 2018-11-30 DIAGNOSIS — R29898 Other symptoms and signs involving the musculoskeletal system: Secondary | ICD-10-CM

## 2018-11-30 DIAGNOSIS — R270 Ataxia, unspecified: Secondary | ICD-10-CM

## 2018-11-30 DIAGNOSIS — R42 Dizziness and giddiness: Secondary | ICD-10-CM | POA: Diagnosis not present

## 2018-12-06 ENCOUNTER — Other Ambulatory Visit: Payer: Self-pay

## 2018-12-06 ENCOUNTER — Other Ambulatory Visit (INDEPENDENT_AMBULATORY_CARE_PROVIDER_SITE_OTHER): Payer: PPO

## 2018-12-06 ENCOUNTER — Ambulatory Visit (INDEPENDENT_AMBULATORY_CARE_PROVIDER_SITE_OTHER): Payer: PPO | Admitting: Internal Medicine

## 2018-12-06 ENCOUNTER — Encounter: Payer: Self-pay | Admitting: Internal Medicine

## 2018-12-06 VITALS — BP 140/86 | HR 59 | Temp 98.4°F | Ht 60.0 in | Wt 157.0 lb

## 2018-12-06 DIAGNOSIS — E871 Hypo-osmolality and hyponatremia: Secondary | ICD-10-CM

## 2018-12-06 DIAGNOSIS — R270 Ataxia, unspecified: Secondary | ICD-10-CM

## 2018-12-06 DIAGNOSIS — R531 Weakness: Secondary | ICD-10-CM

## 2018-12-06 DIAGNOSIS — L659 Nonscarring hair loss, unspecified: Secondary | ICD-10-CM | POA: Insufficient documentation

## 2018-12-06 LAB — BASIC METABOLIC PANEL
BUN: 16 mg/dL (ref 6–23)
CO2: 30 mEq/L (ref 19–32)
CREATININE: 0.73 mg/dL (ref 0.40–1.20)
Calcium: 10 mg/dL (ref 8.4–10.5)
Chloride: 97 mEq/L (ref 96–112)
GFR: 78.3 mL/min (ref >60.00)
Glucose, Bld: 114 mg/dL — ABNORMAL HIGH (ref 70–99)
Potassium: 4 mEq/L (ref 3.5–5.1)
Sodium: 135 mEq/L (ref 135–145)

## 2018-12-06 NOTE — Assessment & Plan Note (Signed)
Labs Maxzide is on hold

## 2018-12-06 NOTE — Assessment & Plan Note (Signed)
post-flu Labs OK B complex, Biotin

## 2018-12-06 NOTE — Patient Instructions (Signed)
Take Vitamin B complex for hair

## 2018-12-06 NOTE — Assessment & Plan Note (Signed)
Post-flu Discussed Repeat labs

## 2018-12-06 NOTE — Assessment & Plan Note (Signed)
Brain MRI ok Better

## 2018-12-06 NOTE — Progress Notes (Signed)
Subjective:  Patient ID: Alexandra Scott, female    DOB: 1946-04-06  Age: 73 y.o. MRN: [id number]  CC: No chief complaint on file.   HPI Alexandra Scott presents for fatigue - no change, low Na C/o hair loss after her flu C/o stress - family, anxiety  Outpatient Medications Prior to Visit  Medication Sig Dispense Refill  . ALPRAZolam (XANAX) 0.5 MG tablet Take 1 tab 1 hour prior to the MRI. Repeat in 1 hr if needed 6 tablet 0  . aspirin 81 MG EC tablet Take 81 mg by mouth at bedtime.     . benazepril (LOTENSIN) 40 MG tablet Take 1 tablet (40 mg total) by mouth daily. 30 tablet 11  . BIOTIN PO Take 1 tablet by mouth at bedtime.    . Calcium Carbonate-Vitamin D3 (CALCIUM 600/VITAMIN D) 600-400 MG-UNIT TABS Take 1 tablet by mouth 2 (two) times daily.     . Magnesium 250 MG TABS Take 250 mg by mouth daily.     . Multiple Vitamins-Minerals (TH COMPLETE MULTI 50+ PO) Take by mouth.    . Omega-3 Fatty Acids (FISH OIL) 1000 MG CAPS Take 1,000 mg by mouth 2 (two) times daily. Reported on 01/11/2016    . Polyethyl Glycol-Propyl Glycol (SYSTANE OP) Place 1 drop into both eyes 2 (two) times daily as needed (dry eyes).    . Turmeric 500 MG TABS Take 500 mg by mouth 2 (two) times daily.     . vitamin C (ASCORBIC ACID) 500 MG tablet Take 500 mg by mouth at bedtime.      No facility-administered medications prior to visit.     ROS: Review of Systems  Constitutional: Positive for fatigue. Negative for activity change, appetite change, chills, diaphoresis, fever and unexpected weight change.  HENT: Negative for congestion, dental problem, ear pain, hearing loss, mouth sores, postnasal drip, sinus pressure, sneezing, sore throat and voice change.   Eyes: Negative for pain and visual disturbance.  Respiratory: Negative for cough, chest tightness, wheezing and stridor.   Cardiovascular: Negative for chest pain, palpitations and leg swelling.  Gastrointestinal: Negative for abdominal distention,  abdominal pain, blood in stool, nausea, rectal pain and vomiting.  Genitourinary: Negative for decreased urine volume, difficulty urinating, dysuria, frequency, hematuria, menstrual problem, vaginal bleeding, vaginal discharge and vaginal pain.  Musculoskeletal: Positive for arthralgias. Negative for back pain, gait problem, joint swelling and neck pain.  Skin: Negative for color change, rash and wound.  Neurological: Negative for dizziness, tremors, syncope, speech difficulty, weakness and light-headedness.  Hematological: Negative for adenopathy.  Psychiatric/Behavioral: Positive for dysphoric mood. Negative for behavioral problems, confusion, decreased concentration, hallucinations, sleep disturbance and suicidal ideas. The patient is nervous/anxious. The patient is not hyperactive.     Objective:  BP 140/86 (BP Location: Left Arm, Patient Position: Sitting, Cuff Size: Normal)   Pulse (!) 59   Temp 98.4 F (36.9 C) (Oral)   Ht 5' (1.524 m)   Wt 157 lb (71.2 kg)   SpO2 98%   BMI 30.66 kg/m   BP Readings from Last 3 Encounters:  12/06/18 140/86  11/27/18 (!) 146/88  11/20/18 (!) 144/82    Wt Readings from Last 3 Encounters:  12/06/18 157 lb (71.2 kg)  11/27/18 157 lb (71.2 kg)  11/20/18 157 lb (71.2 kg)    Physical Exam Constitutional:      General: She is not in acute distress.    Appearance: She is well-developed.  HENT:     Head: Normocephalic.  Right Ear: External ear normal.     Left Ear: External ear normal.     Nose: Nose normal.  Eyes:     General:        Right eye: No discharge.        Left eye: No discharge.     Conjunctiva/sclera: Conjunctivae normal.     Pupils: Pupils are equal, round, and reactive to light.  Neck:     Musculoskeletal: Normal range of motion and neck supple.     Thyroid: No thyromegaly.     Vascular: No JVD.     Trachea: No tracheal deviation.  Cardiovascular:     Rate and Rhythm: Normal rate and regular rhythm.     Heart sounds:  Normal heart sounds.  Pulmonary:     Effort: No respiratory distress.     Breath sounds: No stridor. No wheezing.  Abdominal:     General: Bowel sounds are normal. There is no distension.     Palpations: Abdomen is soft. There is no mass.     Tenderness: There is no abdominal tenderness. There is no guarding or rebound.  Musculoskeletal:        General: No tenderness.  Lymphadenopathy:     Cervical: No cervical adenopathy.  Skin:    Findings: No erythema or rash.  Neurological:     Cranial Nerves: No cranial nerve deficit.     Motor: No abnormal muscle tone.     Coordination: Coordination normal.     Deep Tendon Reflexes: Reflexes normal.  Psychiatric:        Behavior: Behavior normal.        Thought Content: Thought content normal.        Judgment: Judgment normal.   anxious  Lab Results  Component Value Date   WBC 9.3 11/20/2018   HGB 12.8 11/20/2018   HCT 38.1 11/20/2018   PLT 273.0 11/20/2018   GLUCOSE 104 (H) 11/27/2018   CHOL 147 02/07/2018   TRIG 70.0 02/07/2018   HDL 44.30 02/07/2018   LDLDIRECT 150.6 10/15/2007   LDLCALC 89 02/07/2018   ALT 25 10/17/2018   AST 21 10/17/2018   NA 132 (L) 11/27/2018   K 3.9 11/27/2018   CL 94 (L) 11/27/2018   CREATININE 0.61 11/27/2018   BUN 12 11/27/2018   CO2 30 11/27/2018   TSH 0.796 10/08/2018   INR 0.97 03/29/2011    Mr Jodene Nam Head Wo Contrast  Result Date: 11/30/2018 CLINICAL DATA:  73 year old female with ataxia, dizziness, vertigo, right lower extremity weakness. EXAM: MRA HEAD WITHOUT CONTRAST TECHNIQUE: Angiographic images of the Circle of Willis were obtained using MRA technique without intravenous contrast. COMPARISON:  None. FINDINGS: No intracranial mass effect or ventriculomegaly. Acute ischemia is not evaluated with MRA technique. Antegrade flow in the posterior circulation with codominant distal vertebral arteries which are patent to the basilar without stenosis. Patent right PICA and dominant appearing left  AICA origins. Patent basilar artery without stenosis. Normal SCA and PCA origins. Posterior communicating arteries are diminutive or absent. Bilateral PCA branches are within normal limits. Antegrade flow in both ICA siphons. The left siphon is mildly dominant owing to dominance of the left ACA A1 segment (normal variant). No siphon stenosis. Normal ophthalmic artery origins. Small infundibulum at the distal left ICA. Patent carotid termini. Normal MCA and ACA origins. Anterior communicating artery and visible ACA branches are within normal limits. Left MCA M1 segment, left MCA bifurcation and visible left MCA branches are within normal limits. Right MCA  M1 segment, bifurcation, and visible right MCA branches are within normal limits. IMPRESSION: Negative intracranial MRA. MRI brain (without contrast would suffice) would be necessary to evaluate for acute ischemia. Electronically Signed   By: Genevie Ann M.D.   On: 11/30/2018 13:04    Assessment & Plan:   There are no diagnoses linked to this encounter.   No orders of the defined types were placed in this encounter.    Follow-up: No follow-ups on file.  Walker Kehr, MD

## 2018-12-10 ENCOUNTER — Telehealth: Payer: Self-pay | Admitting: Internal Medicine

## 2018-12-10 NOTE — Telephone Encounter (Signed)
Copied from [place] [id number]. Topic: Quick Communication - See Telephone Encounter >> Dec 10, 2018  3:58 PM Blase Mess A wrote: CRM for notification. See Telephone encounter for: 12/10/18.  Patient is calling back for lab results. Please advise. Thank you

## 2019-01-09 ENCOUNTER — Other Ambulatory Visit (INDEPENDENT_AMBULATORY_CARE_PROVIDER_SITE_OTHER): Payer: PPO

## 2019-01-09 ENCOUNTER — Other Ambulatory Visit: Payer: Self-pay

## 2019-01-09 ENCOUNTER — Encounter: Payer: Self-pay | Admitting: Internal Medicine

## 2019-01-09 ENCOUNTER — Ambulatory Visit (INDEPENDENT_AMBULATORY_CARE_PROVIDER_SITE_OTHER): Payer: PPO | Admitting: Internal Medicine

## 2019-01-09 VITALS — BP 142/76 | HR 89 | Temp 98.6°F | Ht 60.0 in | Wt 157.0 lb

## 2019-01-09 DIAGNOSIS — I10 Essential (primary) hypertension: Secondary | ICD-10-CM | POA: Diagnosis not present

## 2019-01-09 DIAGNOSIS — R739 Hyperglycemia, unspecified: Secondary | ICD-10-CM

## 2019-01-09 DIAGNOSIS — R509 Fever, unspecified: Secondary | ICD-10-CM | POA: Diagnosis not present

## 2019-01-09 DIAGNOSIS — L659 Nonscarring hair loss, unspecified: Secondary | ICD-10-CM

## 2019-01-09 DIAGNOSIS — F439 Reaction to severe stress, unspecified: Secondary | ICD-10-CM | POA: Diagnosis not present

## 2019-01-09 DIAGNOSIS — D485 Neoplasm of uncertain behavior of skin: Secondary | ICD-10-CM | POA: Diagnosis not present

## 2019-01-09 DIAGNOSIS — E871 Hypo-osmolality and hyponatremia: Secondary | ICD-10-CM

## 2019-01-09 DIAGNOSIS — I454 Nonspecific intraventricular block: Secondary | ICD-10-CM | POA: Diagnosis not present

## 2019-01-09 DIAGNOSIS — M545 Low back pain: Secondary | ICD-10-CM | POA: Diagnosis not present

## 2019-01-09 DIAGNOSIS — R05 Cough: Secondary | ICD-10-CM | POA: Diagnosis not present

## 2019-01-09 LAB — TSH: TSH: 3.21 u[IU]/mL (ref 0.35–4.50)

## 2019-01-09 LAB — BASIC METABOLIC PANEL
BUN: 13 mg/dL (ref 6–23)
CO2: 29 mEq/L (ref 19–32)
Calcium: 9.7 mg/dL (ref 8.4–10.5)
Chloride: 97 mEq/L (ref 96–112)
Creatinine, Ser: 0.71 mg/dL (ref 0.40–1.20)
GFR: 80.83 mL/min (ref >60.00)
Glucose, Bld: 104 mg/dL — ABNORMAL HIGH (ref 70–99)
Potassium: 4.1 mEq/L (ref 3.5–5.1)
Sodium: 135 mEq/L (ref 135–145)

## 2019-01-09 LAB — HEMOGLOBIN A1C: Hgb A1c MFr Bld: 5.7 % (ref 4.6–6.5)

## 2019-01-09 MED ORDER — IRBESARTAN 150 MG PO TABS: 150.0000 mg | ORAL_TABLET | Freq: Every day | ORAL | 3 refills | Status: DC

## 2019-01-09 NOTE — Assessment & Plan Note (Signed)
Stop Benazepril Start Irbesartan

## 2019-01-09 NOTE — Progress Notes (Signed)
Subjective:  Patient ID: Alexandra Scott, female    DOB: 11/30/1945  Age: 73 y.o. MRN: [id number]  CC: No chief complaint on file.   HPI Alexandra Scott presents for fatigue, RLE pain, low Na, HTN f/u Better, still tired - getting better. C/o leg weakness, dry mouth... C/o hair loss x 6 mo  Outpatient Medications Prior to Visit  Medication Sig Dispense Refill  . ALPRAZolam (XANAX) 0.5 MG tablet Take 1 tab 1 hour prior to the MRI. Repeat in 1 hr if needed 6 tablet 0  . aspirin 81 MG EC tablet Take 81 mg by mouth at bedtime.     . benazepril (LOTENSIN) 40 MG tablet Take 1 tablet (40 mg total) by mouth daily. 30 tablet 11  . BIOTIN PO Take 1 tablet by mouth at bedtime.    . Calcium Carbonate-Vitamin D3 (CALCIUM 600/VITAMIN D) 600-400 MG-UNIT TABS Take 1 tablet by mouth 2 (two) times daily.     . Magnesium 250 MG TABS Take 250 mg by mouth daily.     . Multiple Vitamins-Minerals (TH COMPLETE MULTI 50+ PO) Take by mouth.    . Omega-3 Fatty Acids (FISH OIL) 1000 MG CAPS Take 1,000 mg by mouth 2 (two) times daily. Reported on 01/11/2016      . Polyethyl Glycol-Propyl Glycol (SYSTANE OP) Place 1 drop into both eyes 2 (two) times daily as needed (dry eyes).    . Turmeric 500 MG TABS Take 500 mg by mouth 2 (two) times daily.     . vitamin C (ASCORBIC ACID) 500 MG tablet Take 500 mg by mouth at bedtime.      No facility-administered medications prior to visit.     ROS: Review of Systems  Constitutional: Positive for fatigue. Negative for activity change, appetite change, chills and unexpected weight change.  HENT: Negative for congestion, mouth sores and sinus pressure.   Eyes: Negative for visual disturbance.  Respiratory: Negative for cough and chest tightness.   Gastrointestinal: Negative for abdominal pain and nausea.  Genitourinary: Negative for difficulty urinating, frequency and vaginal pain.  Musculoskeletal: Positive for back pain. Negative for gait problem.  Skin: Negative  for pallor and rash.  Neurological: Negative for dizziness, tremors, weakness, numbness and headaches.  Psychiatric/Behavioral: Negative for confusion and sleep disturbance.    Objective:  BP (!) 142/76 (BP Location: Left Arm, Patient Position: Sitting, Cuff Size: Large)   Pulse 89   Temp 98.6 F (37 C) (Oral)   Ht 5' (1.524 m)   Wt 157 lb (71.2 kg)   SpO2 97%   BMI 30.66 kg/m   BP Readings from Last 3 Encounters:  01/09/19 (!) 142/76  12/06/18 140/86  11/27/18 (!) 146/88    Wt Readings from Last 3 Encounters:  01/09/19 157 lb (71.2 kg)  12/06/18 157 lb (71.2 kg)  11/27/18 157 lb (71.2 kg)    Physical Exam Constitutional:      General: She is not in acute distress.    Appearance: She is well-developed.  HENT:     Head: Normocephalic.     Right Ear: External ear normal.     Left Ear: External ear normal.     Nose: Nose normal.  Eyes:     General:        Right eye: No discharge.        Left eye: No discharge.     Conjunctiva/sclera: Conjunctivae normal.     Pupils: Pupils are equal, round, and reactive to light.  Neck:     Musculoskeletal: Normal range of motion and neck supple.     Thyroid: No thyromegaly.     Vascular: No JVD.     Trachea: No tracheal deviation.  Cardiovascular:     Rate and Rhythm: Normal rate and regular rhythm.     Heart sounds: Normal heart sounds.  Pulmonary:     Effort: No respiratory distress.     Breath sounds: No stridor. No wheezing.  Abdominal:     General: Bowel sounds are normal. There is no distension.     Palpations: Abdomen is soft. There is no mass.     Tenderness: There is no abdominal tenderness. There is no guarding or rebound.  Musculoskeletal:        General: No tenderness.  Lymphadenopathy:     Cervical: No cervical adenopathy.  Skin:    Findings: No erythema or rash.  Neurological:     Cranial Nerves: No cranial nerve deficit.     Motor: No abnormal muscle tone.     Coordination: Coordination normal.     Deep  Tendon Reflexes: Reflexes normal.  Psychiatric:        Behavior: Behavior normal.        Thought Content: Thought content normal.        Judgment: Judgment normal.   small scab R flank Coated tongue obese  Lab Results  Component Value Date   WBC 9.3 11/20/2018   HGB 12.8 11/20/2018   HCT 38.1 11/20/2018   PLT 273.0 11/20/2018   GLUCOSE 114 (H) 12/06/2018   CHOL 147 02/07/2018   TRIG 70.0 02/07/2018   HDL 44.30 02/07/2018   LDLDIRECT 150.6 10/15/2007   LDLCALC 89 02/07/2018   ALT 25 10/17/2018   AST 21 10/17/2018   NA 135 12/06/2018   K 4.0 12/06/2018   CL 97 12/06/2018   CREATININE 0.73 12/06/2018   BUN 16 12/06/2018   CO2 30 12/06/2018   TSH 0.796 10/08/2018   INR 0.97 03/29/2011    Mr Jodene Nam Head Wo Contrast  Result Date: 11/30/2018 CLINICAL DATA:  73 year old female with ataxia, dizziness, vertigo, right lower extremity weakness. EXAM: MRA HEAD WITHOUT CONTRAST TECHNIQUE: Angiographic images of the Circle of Willis were obtained using MRA technique without intravenous contrast. COMPARISON:  None. FINDINGS: No intracranial mass effect or ventriculomegaly. Acute ischemia is not evaluated with MRA technique. Antegrade flow in the posterior circulation with codominant distal vertebral arteries which are patent to the basilar without stenosis. Patent right PICA and dominant appearing left AICA origins. Patent basilar artery without stenosis. Normal SCA and PCA origins. Posterior communicating arteries are diminutive or absent. Bilateral PCA branches are within normal limits. Antegrade flow in both ICA siphons. The left siphon is mildly dominant owing to dominance of the left ACA A1 segment (normal variant). No siphon stenosis. Normal ophthalmic artery origins. Small infundibulum at the distal left ICA. Patent carotid termini. Normal MCA and ACA origins. Anterior communicating artery and visible ACA branches are within normal limits. Left MCA M1 segment, left MCA bifurcation and visible  left MCA branches are within normal limits. Right MCA M1 segment, bifurcation, and visible right MCA branches are within normal limits. IMPRESSION: Negative intracranial MRA. MRI brain (without contrast would suffice) would be necessary to evaluate for acute ischemia. Electronically Signed   By: Genevie Ann M.D.   On: 11/30/2018 13:04    Assessment & Plan:   There are no diagnoses linked to this encounter.   No orders of the defined  types were placed in this encounter.    Follow-up: No follow-ups on file.  Walker Kehr, MD

## 2019-01-09 NOTE — Assessment & Plan Note (Signed)
Less stress

## 2019-01-09 NOTE — Assessment & Plan Note (Signed)
R flank - see picture Will watch

## 2019-01-09 NOTE — Assessment & Plan Note (Signed)
Stop Benazepril Start Irbesartan BMET

## 2019-01-28 ENCOUNTER — Other Ambulatory Visit: Payer: Self-pay

## 2019-01-28 ENCOUNTER — Encounter: Payer: Self-pay | Admitting: Internal Medicine

## 2019-01-28 ENCOUNTER — Ambulatory Visit (INDEPENDENT_AMBULATORY_CARE_PROVIDER_SITE_OTHER): Payer: PPO | Admitting: Internal Medicine

## 2019-01-28 DIAGNOSIS — R682 Dry mouth, unspecified: Secondary | ICD-10-CM | POA: Diagnosis not present

## 2019-01-28 DIAGNOSIS — R531 Weakness: Secondary | ICD-10-CM | POA: Diagnosis not present

## 2019-01-28 DIAGNOSIS — D485 Neoplasm of uncertain behavior of skin: Secondary | ICD-10-CM

## 2019-01-28 MED ORDER — DOXYCYCLINE HYCLATE 100 MG PO TABS: 100.0000 mg | ORAL_TABLET | Freq: Two times a day (BID) | ORAL | 0 refills | Status: DC

## 2019-01-28 MED ORDER — FLUCONAZOLE 150 MG PO TABS: 150.0000 mg | ORAL_TABLET | Freq: Once | ORAL | 1 refills | Status: AC

## 2019-01-28 MED ORDER — TRIAMCINOLONE ACETONIDE 0.5 % EX CREA: 1.0000 "application " | TOPICAL_CREAM | Freq: Four times a day (QID) | CUTANEOUS | 0 refills | Status: AC

## 2019-01-28 NOTE — Assessment & Plan Note (Signed)
Better a little

## 2019-01-28 NOTE — Assessment & Plan Note (Signed)
Nl exam, slightly coated tongue Use [employer] Peroxicare tooth paste  Stop using listerin!!!

## 2019-01-28 NOTE — Progress Notes (Signed)
Subjective:  Patient ID: Alexandra Scott, female    DOB: [birth year]  Age: 73 y.o. MRN: [id number]  CC: No chief complaint on file.   HPI Alexandra Scott presents for a lesion on R flank since April. We took a picture on 4/15. I thought it was a hemorrhaged mole. Granddaughter removed it - it was a tick No rash around it F/u fatigue, dry mouth - using Listerin...  Outpatient Medications Prior to Visit  Medication Sig Dispense Refill  . ALPRAZolam (XANAX) 0.5 MG tablet Take 1 tab 1 hour prior to the MRI. Repeat in 1 hr if needed 6 tablet 0  . aspirin 81 MG EC tablet Take 81 mg by mouth at bedtime.     Marland Kitchen BIOTIN PO Take 1 tablet by mouth at bedtime.    . Calcium Carbonate-Vitamin D3 (CALCIUM 600/VITAMIN D) 600-400 MG-UNIT TABS Take 1 tablet by mouth 2 (two) times daily.     . irbesartan (AVAPRO) 150 MG tablet Take 1 tablet (150 mg total) by mouth daily. 90 tablet 3  . Magnesium 250 MG TABS Take 250 mg by mouth daily.     . Multiple Vitamins-Minerals (TH COMPLETE MULTI 50+ PO) Take by mouth.    . Omega-3 Fatty Acids (FISH OIL) 1000 MG CAPS Take 1,000 mg by mouth 2 (two) times daily. Reported on 01/11/2016    . Polyethyl Glycol-Propyl Glycol (SYSTANE OP) Place 1 drop into both eyes 2 (two) times daily as needed (dry eyes).    . Turmeric 500 MG TABS Take 500 mg by mouth 2 (two) times daily.     . vitamin C (ASCORBIC ACID) 500 MG tablet Take 500 mg by mouth at bedtime.      No facility-administered medications prior to visit.     ROS: Review of Systems  Constitutional: Positive for fatigue. Negative for activity change, appetite change, chills and unexpected weight change.  HENT: Negative for congestion, mouth sores and sinus pressure.   Eyes: Negative for visual disturbance.  Respiratory: Negative for cough and chest tightness.   Gastrointestinal: Negative for abdominal pain and nausea.  Genitourinary: Negative for difficulty urinating, frequency and vaginal pain.  Musculoskeletal:  Positive for back pain. Negative for gait problem.  Skin: Negative for pallor and rash.  Neurological: Negative for dizziness, tremors, weakness, numbness and headaches.  Psychiatric/Behavioral: Negative for confusion and sleep disturbance. The patient is nervous/anxious.     Objective:  There were no vitals taken for this visit.  BP Readings from Last 3 Encounters:  01/09/19 (!) 142/76  12/06/18 140/86  11/27/18 (!) 146/88    Wt Readings from Last 3 Encounters:  01/09/19 157 lb (71.2 kg)  12/06/18 157 lb (71.2 kg)  11/27/18 157 lb (71.2 kg)    Physical Exam Constitutional:      General: She is not in acute distress.    Appearance: She is well-developed.  HENT:     Head: Normocephalic.     Right Ear: External ear normal.     Left Ear: External ear normal.     Nose: Nose normal.  Eyes:     General:        Right eye: No discharge.        Left eye: No discharge.     Conjunctiva/sclera: Conjunctivae normal.     Pupils: Pupils are equal, round, and reactive to light.  Neck:     Musculoskeletal: Normal range of motion and neck supple.     Thyroid: No thyromegaly.  Vascular: No JVD.     Trachea: No tracheal deviation.  Cardiovascular:     Rate and Rhythm: Normal rate and regular rhythm.     Heart sounds: Normal heart sounds.  Pulmonary:     Effort: No respiratory distress.     Breath sounds: No stridor. No wheezing.  Abdominal:     General: Bowel sounds are normal. There is no distension.     Palpations: Abdomen is soft. There is no mass.     Tenderness: There is no abdominal tenderness. There is no guarding or rebound.  Musculoskeletal:        General: No tenderness.  Lymphadenopathy:     Cervical: No cervical adenopathy.  Skin:    Findings: No erythema or rash.  Neurological:     Cranial Nerves: No cranial nerve deficit.     Motor: No abnormal muscle tone.     Coordination: Coordination normal.     Deep Tendon Reflexes: Reflexes normal.  Psychiatric:         Behavior: Behavior normal.        Thought Content: Thought content normal.        Judgment: Judgment normal.   a papule 9x20 mm lesion on R flank since April.   Lab Results  Component Value Date   WBC 9.3 11/20/2018   HGB 12.8 11/20/2018   HCT 38.1 11/20/2018   PLT 273.0 11/20/2018   GLUCOSE 104 (H) 01/09/2019   CHOL 147 02/07/2018   TRIG 70.0 02/07/2018   HDL 44.30 02/07/2018   LDLDIRECT 150.6 10/15/2007   LDLCALC 89 02/07/2018   ALT 25 10/17/2018   AST 21 10/17/2018   NA 135 01/09/2019   K 4.1 01/09/2019   CL 97 01/09/2019   CREATININE 0.71 01/09/2019   BUN 13 01/09/2019   CO2 29 01/09/2019   TSH 3.21 01/09/2019   INR 0.97 03/29/2011   HGBA1C 5.7 01/09/2019    Mr Jodene Nam Head Wo Contrast  Result Date: 11/30/2018 CLINICAL DATA:  73 year old female with ataxia, dizziness, vertigo, right lower extremity weakness. EXAM: MRA HEAD WITHOUT CONTRAST TECHNIQUE: Angiographic images of the Circle of Willis were obtained using MRA technique without intravenous contrast. COMPARISON:  None. FINDINGS: No intracranial mass effect or ventriculomegaly. Acute ischemia is not evaluated with MRA technique. Antegrade flow in the posterior circulation with codominant distal vertebral arteries which are patent to the basilar without stenosis. Patent right PICA and dominant appearing left AICA origins. Patent basilar artery without stenosis. Normal SCA and PCA origins. Posterior communicating arteries are diminutive or absent. Bilateral PCA branches are within normal limits. Antegrade flow in both ICA siphons. The left siphon is mildly dominant owing to dominance of the left ACA A1 segment (normal variant). No siphon stenosis. Normal ophthalmic artery origins. Small infundibulum at the distal left ICA. Patent carotid termini. Normal MCA and ACA origins. Anterior communicating artery and visible ACA branches are within normal limits. Left MCA M1 segment, left MCA bifurcation and visible left MCA branches are  within normal limits. Right MCA M1 segment, bifurcation, and visible right MCA branches are within normal limits. IMPRESSION: Negative intracranial MRA. MRI brain (without contrast would suffice) would be necessary to evaluate for acute ischemia. Electronically Signed   By: Genevie Ann M.D.   On: 11/30/2018 13:04    Assessment & Plan:   There are no diagnoses linked to this encounter.   No orders of the defined types were placed in this encounter.    Follow-up: No follow-ups on file.  Walker Kehr, MD

## 2019-01-28 NOTE — Assessment & Plan Note (Signed)
Empiric Doxy Triamc cream

## 2019-01-28 NOTE — Patient Instructions (Signed)
Stop using Listerine please!

## 2019-02-05 ENCOUNTER — Telehealth: Payer: Self-pay | Admitting: Internal Medicine

## 2019-02-05 NOTE — Telephone Encounter (Signed)
She has a lot of arthritis throughout the low back but no changes which are acute including no fractures.

## 2019-02-05 NOTE — Telephone Encounter (Signed)
Copied from [place] [phone]. Topic: Quick Communication - See Telephone Encounter >> Feb 05, 2019 11:51 AM Sheran Luz wrote: CRM for notification. See Telephone encounter for: 02/05/19.  Patient calling to request imaging results from 11/20/2018.She states that she never received them. She is requesting a call back.

## 2019-02-05 NOTE — Telephone Encounter (Signed)
I do not see any result notes, please advise about results in Dr. Enis Slipper absence

## 2019-02-05 NOTE — Telephone Encounter (Signed)
Pt.notified

## 2019-03-30 IMAGING — DX LUMBAR SPINE - 2-3 VIEW
3 series · 3 of 3 positions shown · non-contrast
Comparison: CT scan of the abdomen pelvis dated 01/23/2017

CLINICAL DATA: Low back pain and right hip pain and right leg
weakness for 2-3 months.

EXAM:
LUMBAR SPINE - 2-3 VIEW

[l-spine ap]
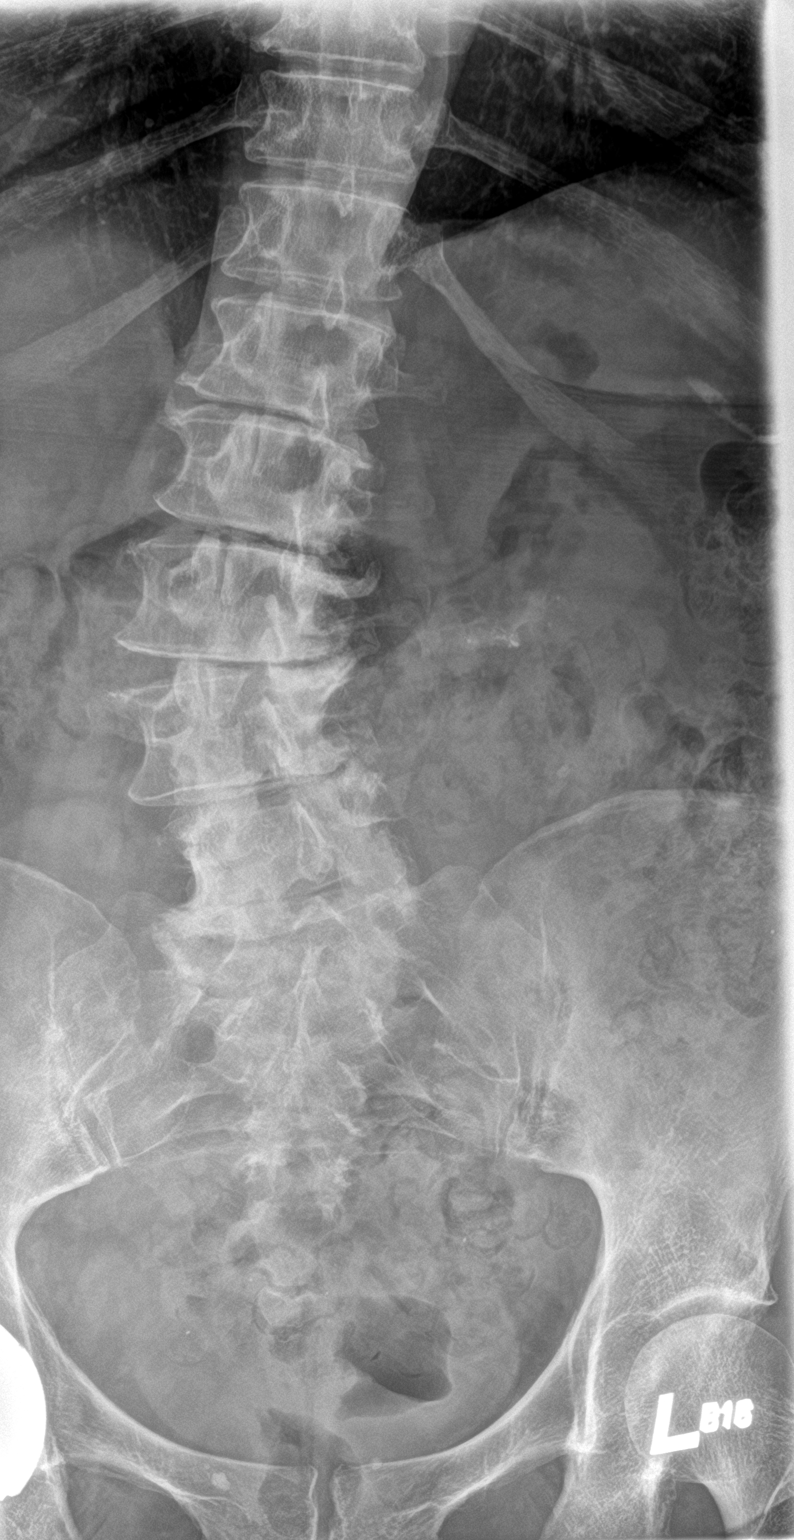

[l-spine lat]
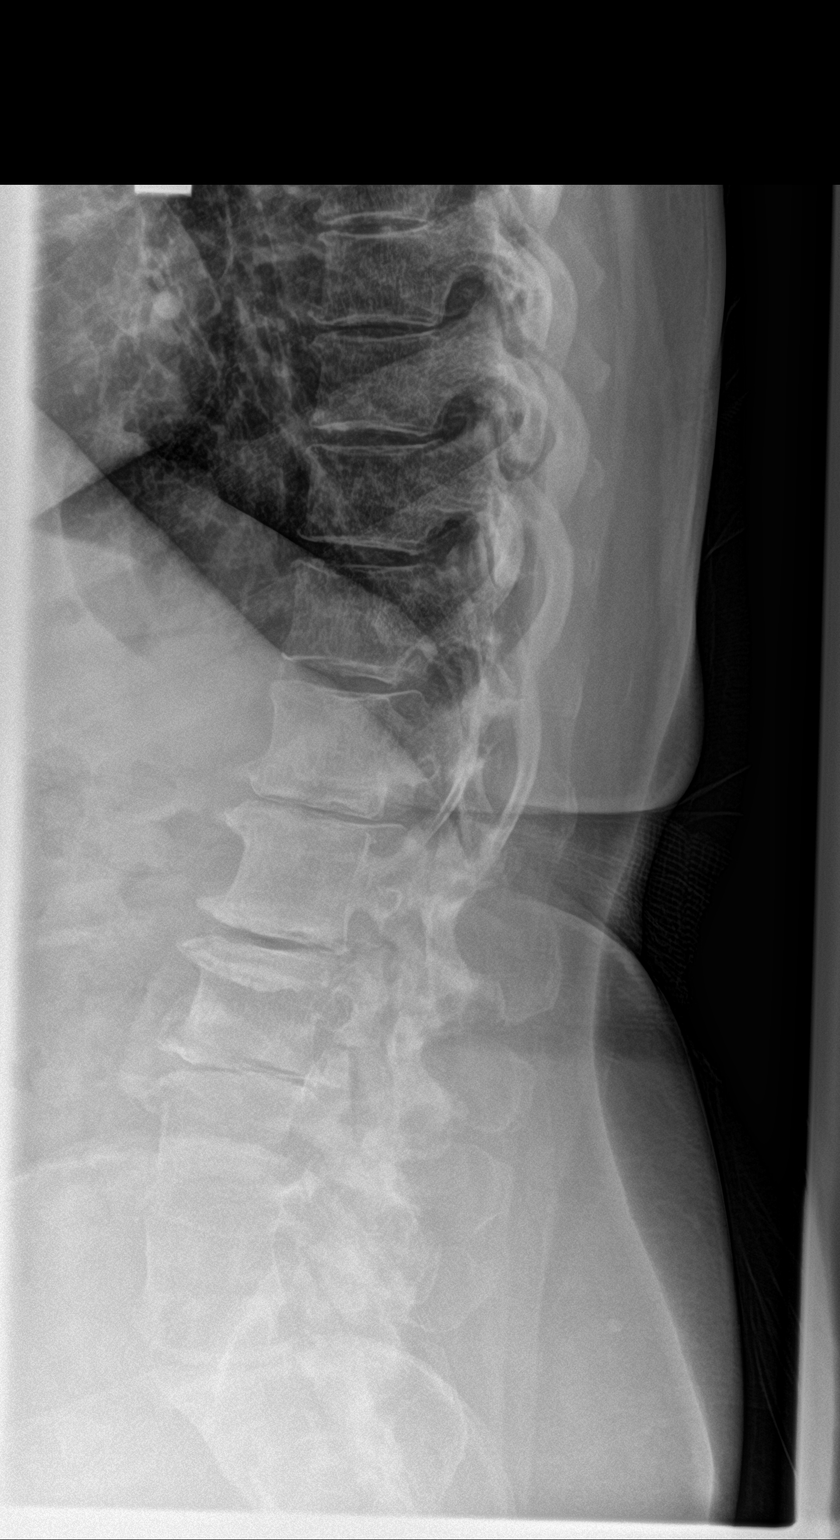

[l-spine spot]
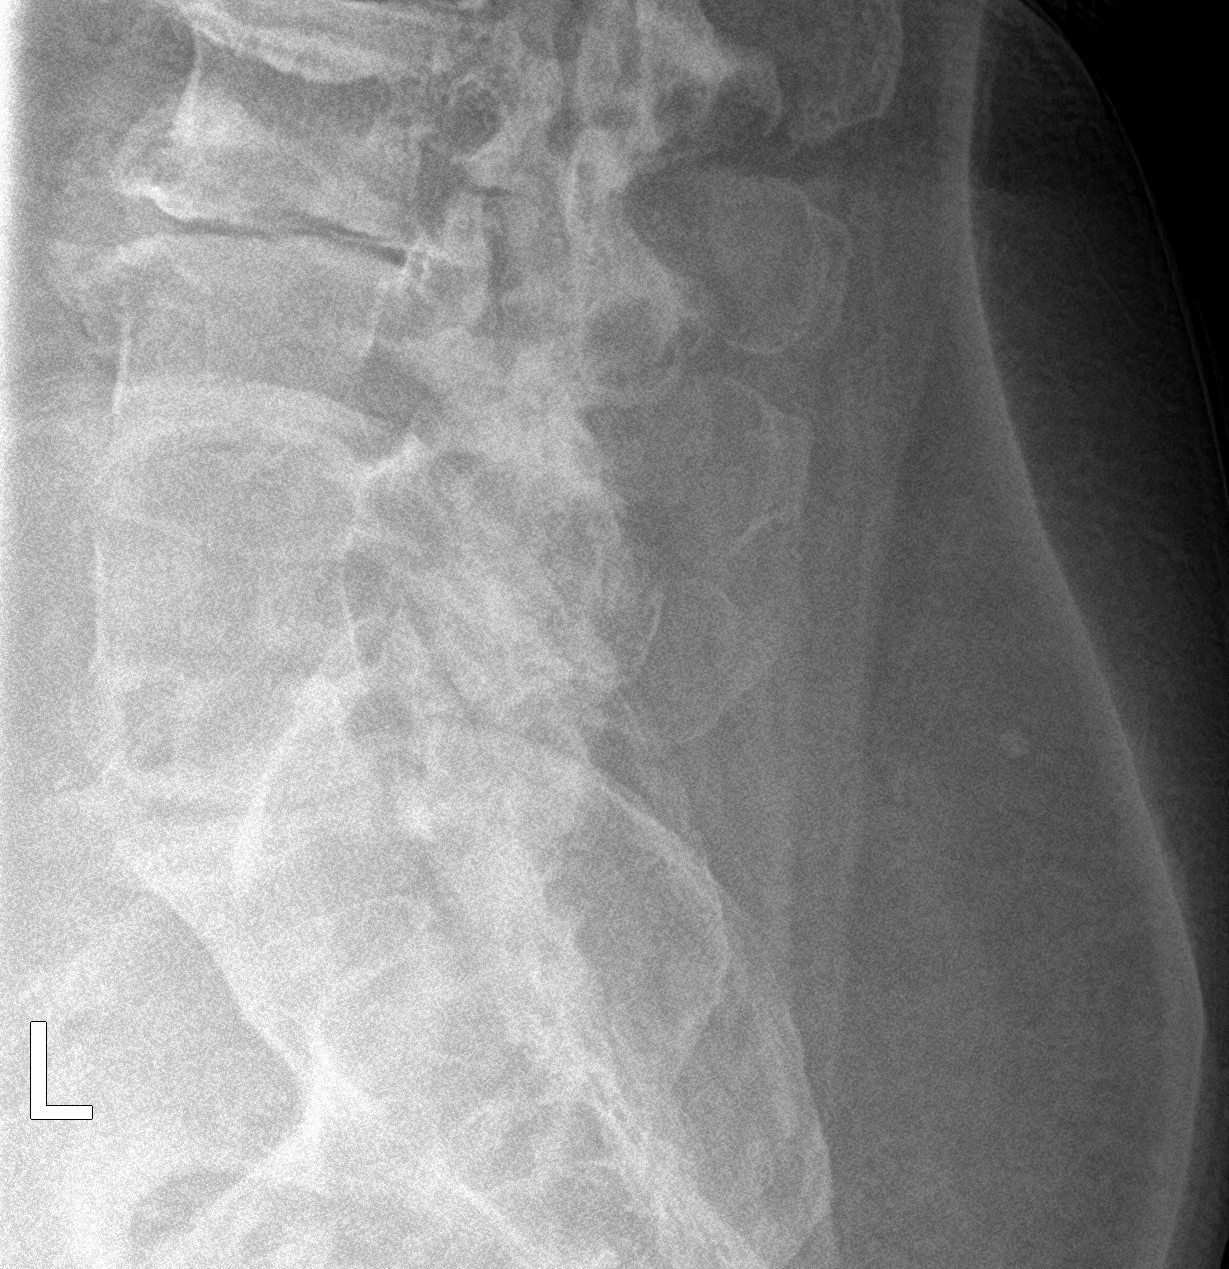

[3 of 3 positions shown; findings below may reference images not displayed]

FINDINGS: There is a chronic lumbar scoliosis with convexity to the right
centered at L3-4. There is diffuse degeneration of the discs
throughout the lumbar spine. Lateral alignment is normal.

There are degenerative changes of the facet joints L4-5 and L5-S1
and to a lesser degree at L3-4 on the left.

Review of the prior CT scan demonstrates fairly severe spinal
stenosis at L4-5.
IMPRESSION: 1. No acute abnormality of the lumbar spine.
2. Diffuse chronic degenerative disc and joint disease in the lumbar
spine as described.

## 2019-04-09 IMAGING — MR MR MRA HEAD W/O CM
1 series · 11 of 48 positions shown · non-contrast
Comparison: None.

CLINICAL DATA: 72-year-old female with ataxia, dizziness, vertigo,
right lower extremity weakness.

EXAM:
MRA HEAD WITHOUT CONTRAST
TECHNIQUE: Angiographic images of the Circle of Willis were obtained using MRA
technique without intravenous contrast.

[Series 5: [id number] · axial · 0.6mm · 0.26mm/px · z∈[-29,+64]mm · 11 of 175 slices shown]
[im 12/175]
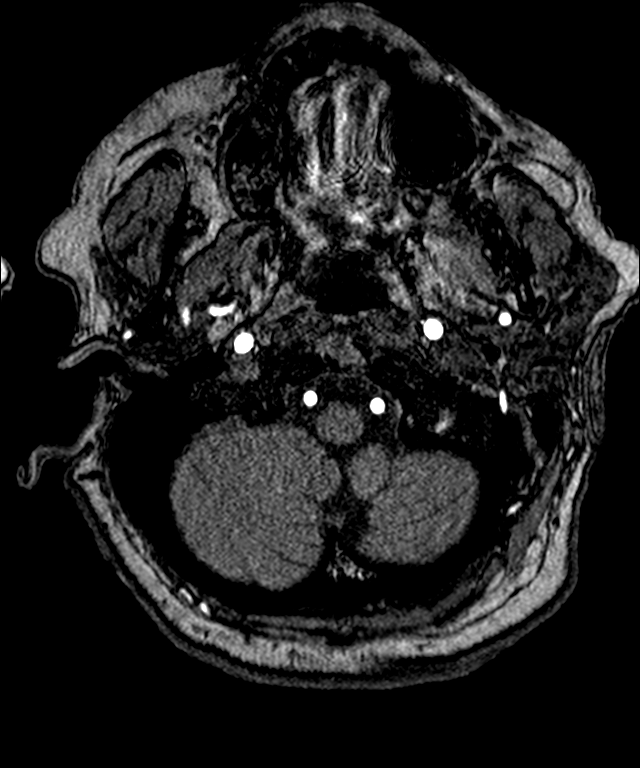
[im 30/175]
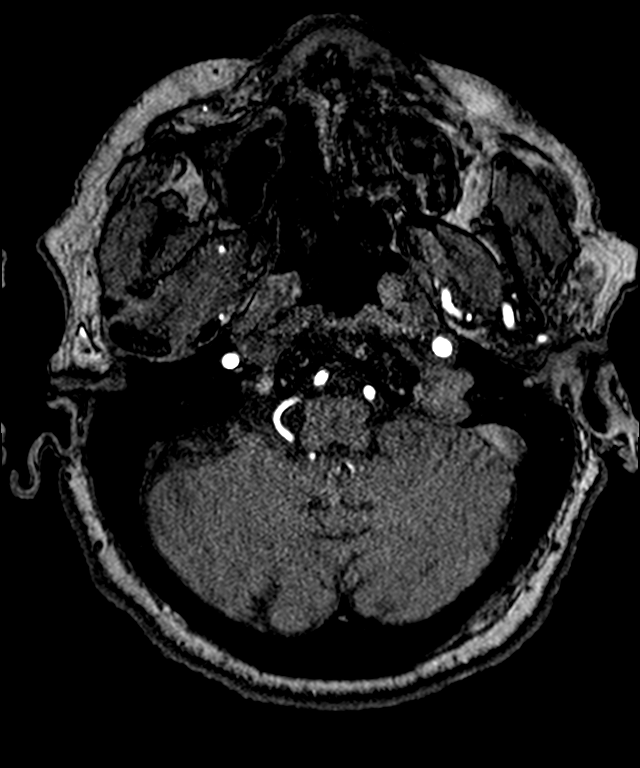
[im 34/175]
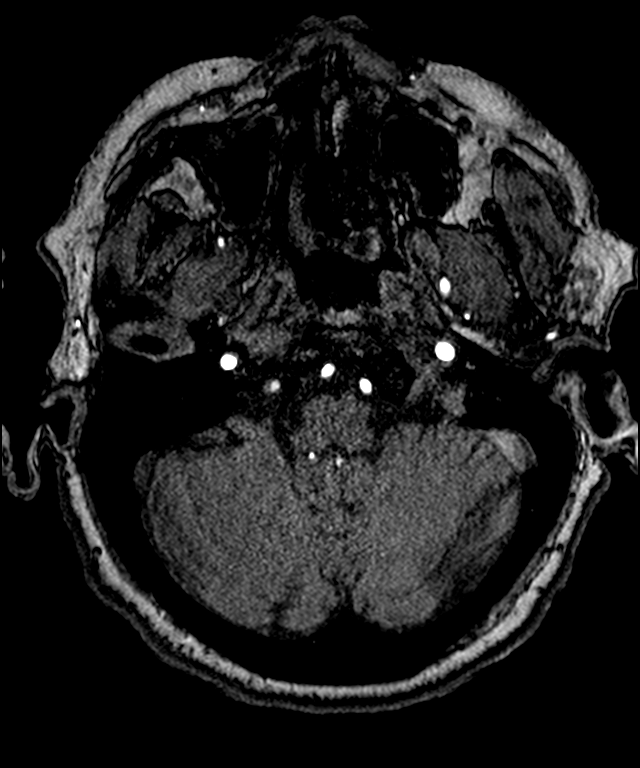
[im 56/175]
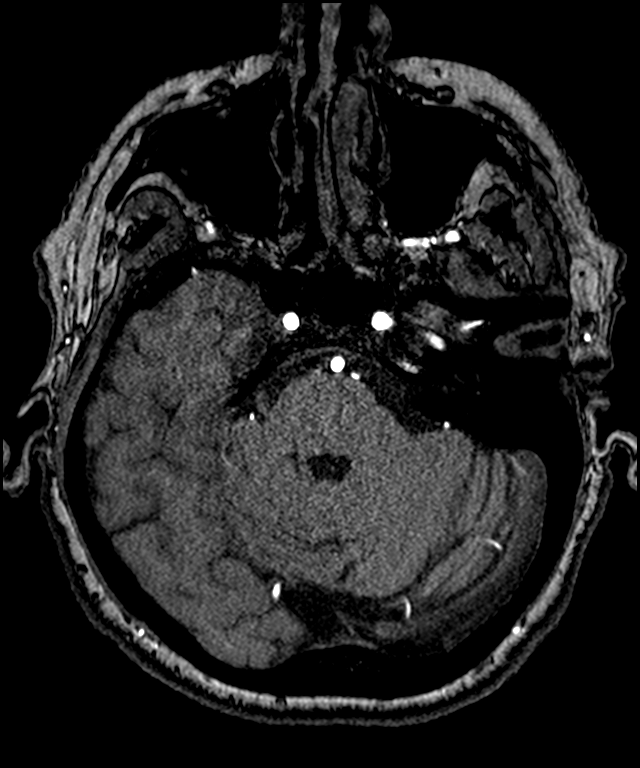
[im 78/175]
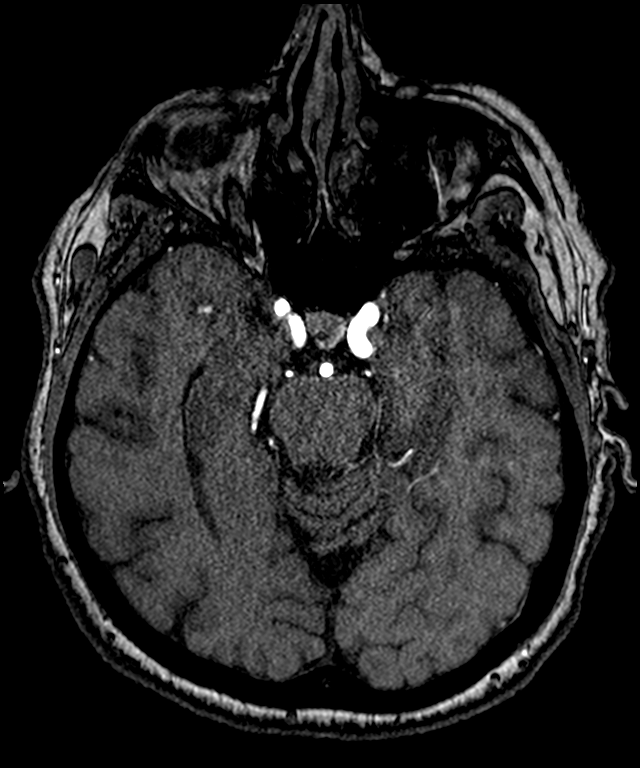
[im 89/175]
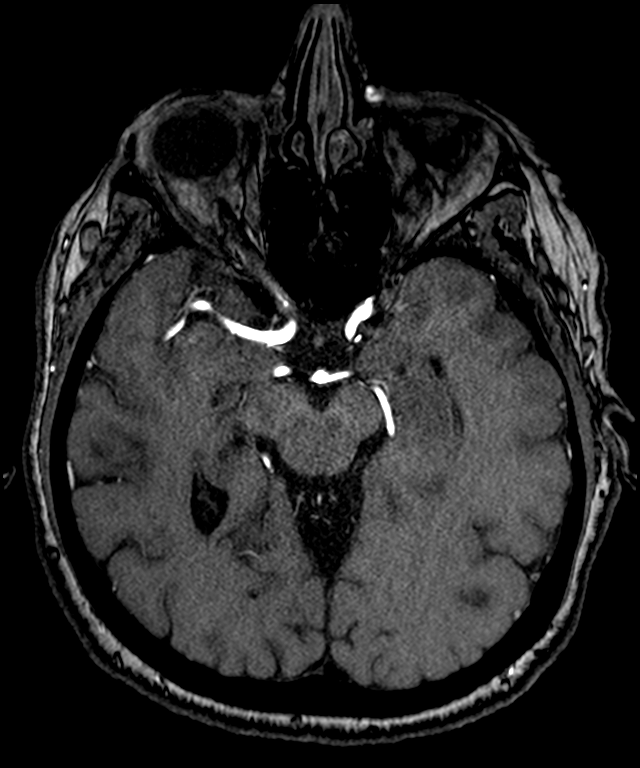
[im 100/175]
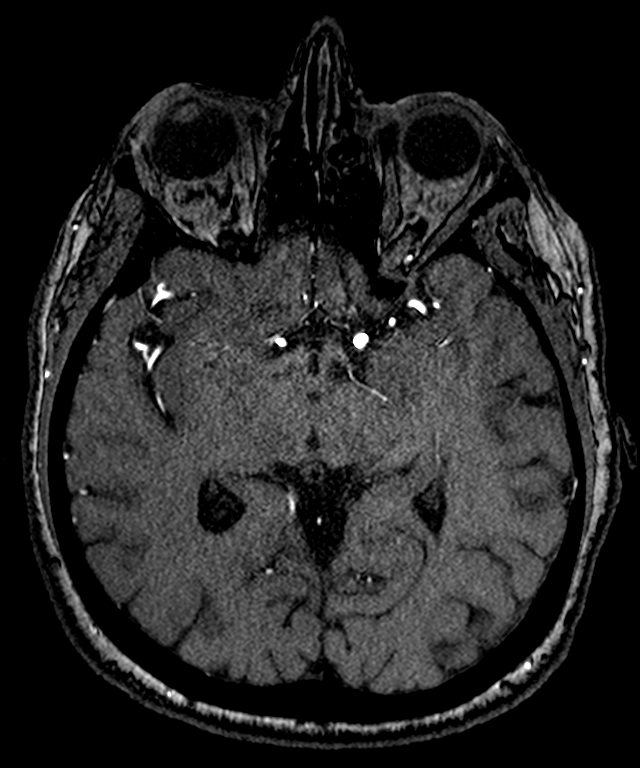
[im 123/175]
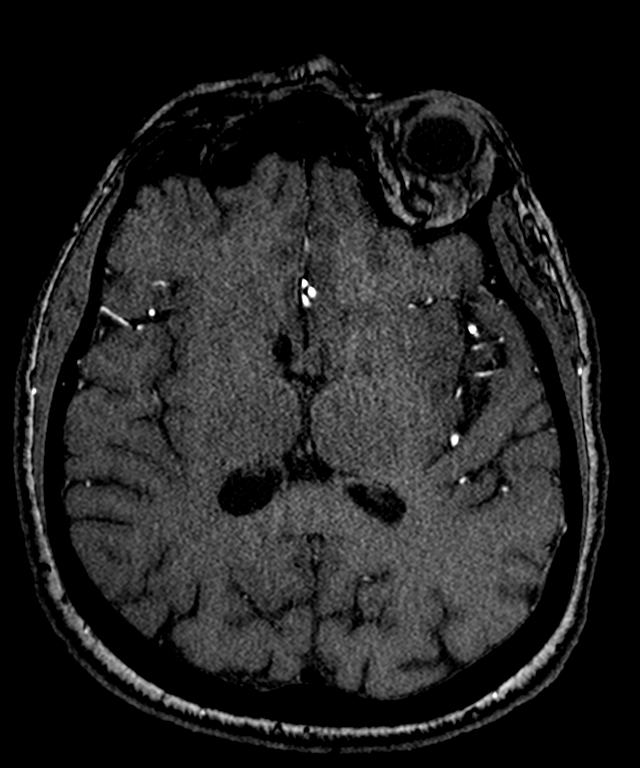
[im 145/175]
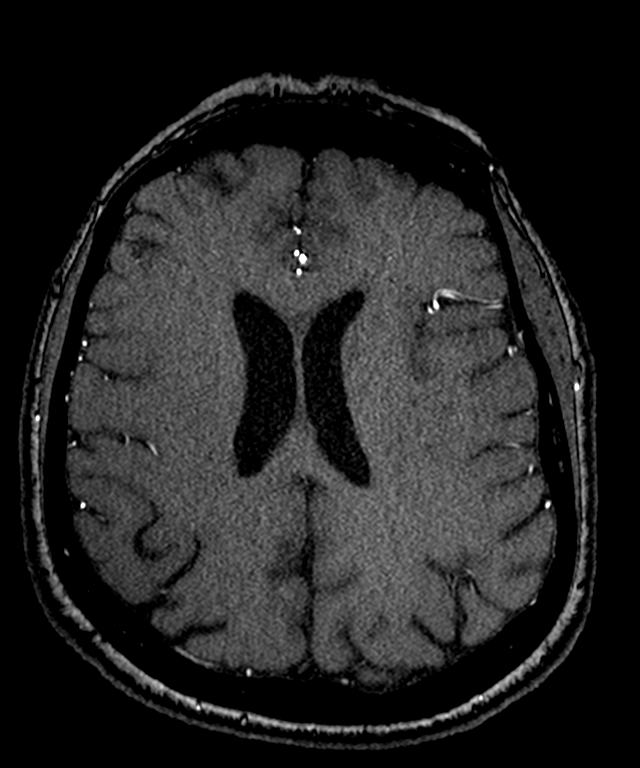
[im 149/175]
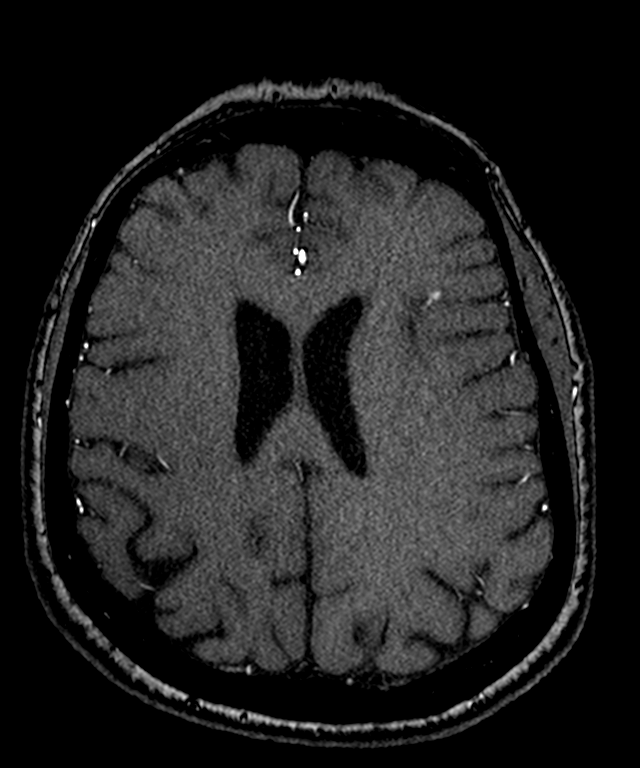
[im 167/175]
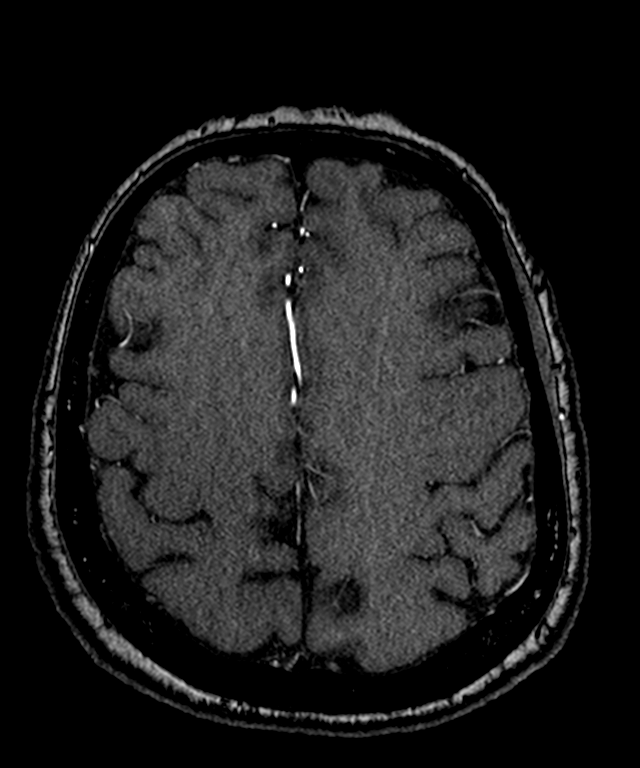

[11 of 48 positions shown; findings below may reference images not displayed]

FINDINGS: No intracranial mass effect or ventriculomegaly. Acute ischemia is
not evaluated with MRA technique.

Antegrade flow in the posterior circulation with codominant distal
vertebral arteries which are patent to the basilar without stenosis.
Patent right PICA and dominant appearing left AICA origins. Patent
basilar artery without stenosis. Normal SCA and PCA origins.
Posterior communicating arteries are diminutive or absent. Bilateral
PCA branches are within normal limits.

Antegrade flow in both ICA siphons. The left siphon is mildly
dominant owing to dominance of the left ACA A1 segment (normal
variant). No siphon stenosis. Normal ophthalmic artery origins.
Small infundibulum at the distal left ICA. Patent carotid termini.
Normal MCA and ACA origins. Anterior communicating artery and
visible ACA branches are within normal limits. Left MCA M1 segment,
left MCA bifurcation and visible left MCA branches are within normal
limits. Right MCA M1 segment, bifurcation, and visible right MCA
branches are within normal limits.
IMPRESSION: Negative intracranial MRA.

MRI brain (without contrast would suffice) would be necessary to
evaluate for acute ischemia.

## 2019-04-10 ENCOUNTER — Ambulatory Visit (INDEPENDENT_AMBULATORY_CARE_PROVIDER_SITE_OTHER): Payer: PPO | Admitting: Internal Medicine

## 2019-04-10 ENCOUNTER — Encounter: Payer: Self-pay | Admitting: Internal Medicine

## 2019-04-10 ENCOUNTER — Ambulatory Visit: Payer: PPO | Admitting: Internal Medicine

## 2019-04-10 ENCOUNTER — Other Ambulatory Visit: Payer: Self-pay

## 2019-04-10 DIAGNOSIS — I10 Essential (primary) hypertension: Secondary | ICD-10-CM | POA: Diagnosis not present

## 2019-04-10 DIAGNOSIS — R42 Dizziness and giddiness: Secondary | ICD-10-CM

## 2019-04-10 DIAGNOSIS — R600 Localized edema: Secondary | ICD-10-CM | POA: Diagnosis not present

## 2019-04-10 DIAGNOSIS — R531 Weakness: Secondary | ICD-10-CM

## 2019-04-10 DIAGNOSIS — R609 Edema, unspecified: Secondary | ICD-10-CM | POA: Insufficient documentation

## 2019-04-10 DIAGNOSIS — R21 Rash and other nonspecific skin eruption: Secondary | ICD-10-CM | POA: Diagnosis not present

## 2019-04-10 MED ORDER — METHYLPREDNISOLONE ACETATE 80 MG/ML IJ SUSP
80.0000 mg | Freq: Once | INTRAMUSCULAR | Status: AC
  Administered 2019-04-10: 80 mg via INTRAMUSCULAR

## 2019-04-10 NOTE — Patient Instructions (Addendum)
Cardiac CT calcium scoring test $150   Computed tomography, more commonly known as a CT or CAT scan, is a diagnostic medical imaging test. Like traditional x-rays, it produces multiple images or pictures of the inside of the body. The cross-sectional images generated during a CT scan can be reformatted in multiple planes. They can even generate three-dimensional images. These images can be viewed on a computer monitor, printed on film or by a 3D printer, or transferred to a CD or DVD. CT images of internal organs, bones, soft tissue and blood vessels provide greater detail than traditional x-rays, particularly of soft tissues and blood vessels. A cardiac CT scan for coronary calcium is a non-invasive way of obtaining information about the presence, location and extent of calcified plaque in the coronary arteries-the vessels that supply oxygen-containing blood to the heart muscle. Calcified plaque results when there is a build-up of fat and other substances under the inner layer of the artery. This material can calcify which signals the presence of atherosclerosis, a disease of the vessel wall, also called coronary artery disease (CAD). People with this disease have an increased risk for heart attacks. In addition, over time, progression of plaque build up (CAD) can narrow the arteries or even close off blood flow to the heart. The result may be chest pain, sometimes called "angina," or a heart attack. Because calcium is a marker of CAD, the amount of calcium detected on a cardiac CT scan is a helpful prognostic tool. The findings on cardiac CT are expressed as a calcium score. Another name for this test is coronary artery calcium scoring.  What are some common uses of the procedure? The goal of cardiac CT scan for calcium scoring is to determine if CAD is present and to what extent, even if there are no symptoms. It is a screening study that may be recommended by a physician for patients with risk factors  for CAD but no clinical symptoms. The major risk factors for CAD are: . high blood cholesterol levels  . family history of heart attacks  . diabetes  . high blood pressure  . cigarette smoking  . overweight or obese  . physical inactivity   A negative cardiac CT scan for calcium scoring shows no calcification within the coronary arteries. This suggests that CAD is absent or so minimal it cannot be seen by this technique. The chance of having a heart attack over the next two to five years is very low under these circumstances. A positive test means that CAD is present, regardless of whether or not the patient is experiencing any symptoms. The amount of calcification-expressed as the calcium score-may help to predict the likelihood of a myocardial infarction (heart attack) in the coming years and helps your medical doctor or cardiologist decide whether the patient may need to take preventive medicine or undertake other measures such as diet and exercise to lower the risk for heart attack. The extent of CAD is graded according to your calcium score:  Calcium Score  Presence of CAD (coronary artery disease)  0 No evidence of CAD   1-10 Minimal evidence of CAD  11-100 Mild evidence of CAD  101-400 Moderate evidence of CAD  Over 400 Extensive evidence of CAD      These suggestions will probably help you to lose weight: 1.  Reduce your consumption of sugars and starches.  Eliminate high fructose corn syrup from your diet.  Reduce your consumption of processed foods.  For desserts try to  have seasonal fruits, berries with with green, knots, cheeses or dark chocolate with more than 70% cacao. 2.  Do not snack 3.  You do not have to eat breakfast.  If you choose to have breakfast-eat plain greek yogurt, eggs, oatmeal (without sugar) 4.  Drink water, freshly brewed unsweetened tea (green, black or herbal) or coffee.  Do not drink sodas including diet sodas , juices, beverages sweetened with  artificial sweeteners. 5.  Reduce your consumption of refined grains. 6.  Avoid protein drinks such as Optifast, Slim fast etc. Eat chicken, fish, meat, dairy and beings for your sources of protein 7.  Natural unprocessed fats like cold pressed virgin olive oil, butter, coconut oil are good for you.  Eat avocados 8.  Increase your consumption of fiber.  Fruits, berries, vegetables, whole grains, flaxseeds, Chia seeds, beans, popcorn, nuts, oatmeal are good sources of fiber 9.  Use vinegar in your diet, i.e. apple cider vinegar, red wine or balsamic vinegar 10.  You can try fasting.  For example you can skip breakfast and lunch every other day (24-hour fast)    Mediterranean diet is good for you.  If you drink alcohol, limit your alcohol intake to no more than 2 drinks a day. The Mediterranean diet is a way of eating based on the traditional cuisine of countries bordering the [employer]. While there is no single definition of the Mediterranean diet, it is typically high in vegetables, fruits, whole grains, beans, nut and seeds, and olive oil. The main components of Mediterranean diet include: Marland Kitchen Daily consumption of vegetables, fruits, whole grains and healthy fats  . Weekly intake of fish, poultry, beans and eggs  . Moderate portions of dairy products  . Limited intake of red meat Other important elements of the Mediterranean diet are sharing meals with family and friends, enjoying a glass of red wine and being physically active.  Cabbage soup recipe for losing weight: Take 1 small head of CABG, 1 average pack of celery, 4 green peppers, 4 onions, 2 cans diced tomatoes, salt and spices to taste.  Chop cabbage, celery, peppers and onions.  And tomatoes and 2-2.5 liters (2.5 quarts) of water so that it would just cover the vegetables.  Bring to boil.  Add spices and salt.  Turn heat to low/medium and simmer for 20-25 minutes.

## 2019-04-10 NOTE — Assessment & Plan Note (Signed)
?  etiology Depomedrol 80 mg IM Triamc cream

## 2019-04-10 NOTE — Assessment & Plan Note (Signed)
ECHO

## 2019-04-10 NOTE — Assessment & Plan Note (Signed)
A cardiac CT scan for calcium scoring offered 7/20 Start Irbesartan

## 2019-04-10 NOTE — Addendum Note (Signed)
Addended by: Karren Cobble on: 04/10/2019 09:20 AM   Modules accepted: Orders

## 2019-04-10 NOTE — Progress Notes (Signed)
Subjective:  Patient ID: Alexandra Scott, female    DOB: 12/14/1945  Age: 73 y.o. MRN: [id number]  CC: No chief complaint on file.   HPI Alexandra Scott presents for fatigue, dizziness, fluid retention, DOE F/u anxiety  Outpatient Medications Prior to Visit  Medication Sig Dispense Refill  . ALPRAZolam (XANAX) 0.5 MG tablet Take 1 tab 1 hour prior to the MRI. Repeat in 1 hr if needed 6 tablet 0  . aspirin 81 MG EC tablet Take 81 mg by mouth at bedtime.     Marland Kitchen BIOTIN PO Take 1 tablet by mouth at bedtime.    . Calcium Carbonate-Vitamin D3 (CALCIUM 600/VITAMIN D) 600-400 MG-UNIT TABS Take 1 tablet by mouth 2 (two) times daily.     . irbesartan (AVAPRO) 150 MG tablet Take 1 tablet (150 mg total) by mouth daily. 90 tablet 3  . Magnesium 250 MG TABS Take 250 mg by mouth daily.     . Multiple Vitamins-Minerals (TH COMPLETE MULTI 50+ PO) Take by mouth.    . Omega-3 Fatty Acids (FISH OIL) 1000 MG CAPS Take 1,000 mg by mouth 2 (two) times daily. Reported on 01/11/2016    . Polyethyl Glycol-Propyl Glycol (SYSTANE OP) Place 1 drop into both eyes 2 (two) times daily as needed (dry eyes).    . triamcinolone cream (KENALOG) 0.5 % Apply 1 application topically 4 (four) times daily. 60 g 0  . Turmeric 500 MG TABS Take 500 mg by mouth 2 (two) times daily.     . vitamin C (ASCORBIC ACID) 500 MG tablet Take 500 mg by mouth at bedtime.     Marland Kitchen doxycycline (VIBRA-TABS) 100 MG tablet Take 1 tablet (100 mg total) by mouth 2 (two) times daily. 20 tablet 0   No facility-administered medications prior to visit.     ROS: Review of Systems  Constitutional: Positive for fatigue. Negative for activity change, appetite change, chills and unexpected weight change.  HENT: Negative for congestion, mouth sores and sinus pressure.   Eyes: Negative for visual disturbance.  Respiratory: Negative for cough and chest tightness.   Gastrointestinal: Negative for abdominal pain and nausea.  Genitourinary: Negative for  difficulty urinating, frequency and vaginal pain.  Musculoskeletal: Positive for arthralgias and back pain. Negative for gait problem.  Skin: Positive for rash. Negative for pallor.  Neurological: Negative for dizziness, tremors, weakness, numbness and headaches.  Psychiatric/Behavioral: Negative for confusion, sleep disturbance and suicidal ideas. The patient is nervous/anxious.     Objective:  BP 134/76 (BP Location: Left Arm, Patient Position: Sitting, Cuff Size: Normal)   Pulse 79   Temp 97.7 F (36.5 C) (Oral)   Ht 5' (1.524 m)   Wt 153 lb (69.4 kg)   SpO2 99%   BMI 29.88 kg/m   BP Readings from Last 3 Encounters:  04/10/19 134/76  01/28/19 140/84  01/09/19 (!) 142/76    Wt Readings from Last 3 Encounters:  04/10/19 153 lb (69.4 kg)  01/09/19 157 lb (71.2 kg)  12/06/18 157 lb (71.2 kg)    Physical Exam Constitutional:      General: She is not in acute distress.    Appearance: She is well-developed. She is obese.  HENT:     Head: Normocephalic.     Right Ear: External ear normal.     Left Ear: External ear normal.     Nose: Nose normal.  Eyes:     General:        Right eye: No discharge.  Left eye: No discharge.     Conjunctiva/sclera: Conjunctivae normal.     Pupils: Pupils are equal, round, and reactive to light.  Neck:     Musculoskeletal: Normal range of motion and neck supple.     Thyroid: No thyromegaly.     Vascular: No JVD.     Trachea: No tracheal deviation.  Cardiovascular:     Rate and Rhythm: Normal rate and regular rhythm.     Heart sounds: Normal heart sounds.  Pulmonary:     Effort: No respiratory distress.     Breath sounds: No stridor. No wheezing.  Abdominal:     General: Bowel sounds are normal. There is no distension.     Palpations: Abdomen is soft. There is no mass.     Tenderness: There is no abdominal tenderness. There is no guarding or rebound.  Musculoskeletal:        General: No tenderness.  Lymphadenopathy:      Cervical: No cervical adenopathy.  Skin:    Findings: Rash present. No erythema.  Neurological:     Mental Status: She is oriented to person, place, and time.     Cranial Nerves: No cranial nerve deficit.     Motor: No abnormal muscle tone.     Coordination: Coordination normal.     Deep Tendon Reflexes: Reflexes normal.  Psychiatric:        Behavior: Behavior normal.        Thought Content: Thought content normal.        Judgment: Judgment normal.    Faint rash - papules No edema   Lab Results  Component Value Date   WBC 9.3 11/20/2018   HGB 12.8 11/20/2018   HCT 38.1 11/20/2018   PLT 273.0 11/20/2018   GLUCOSE 104 (H) 01/09/2019   CHOL 147 02/07/2018   TRIG 70.0 02/07/2018   HDL 44.30 02/07/2018   LDLDIRECT 150.6 10/15/2007   LDLCALC 89 02/07/2018   ALT 25 10/17/2018   AST 21 10/17/2018   NA 135 01/09/2019   K 4.1 01/09/2019   CL 97 01/09/2019   CREATININE 0.71 01/09/2019   BUN 13 01/09/2019   CO2 29 01/09/2019   TSH 3.21 01/09/2019   INR 0.97 03/29/2011   HGBA1C 5.7 01/09/2019    Mr Jodene Nam Head Wo Contrast  Result Date: 11/30/2018 CLINICAL DATA:  73 year old female with ataxia, dizziness, vertigo, right lower extremity weakness. EXAM: MRA HEAD WITHOUT CONTRAST TECHNIQUE: Angiographic images of the Circle of Willis were obtained using MRA technique without intravenous contrast. COMPARISON:  None. FINDINGS: No intracranial mass effect or ventriculomegaly. Acute ischemia is not evaluated with MRA technique. Antegrade flow in the posterior circulation with codominant distal vertebral arteries which are patent to the basilar without stenosis. Patent right PICA and dominant appearing left AICA origins. Patent basilar artery without stenosis. Normal SCA and PCA origins. Posterior communicating arteries are diminutive or absent. Bilateral PCA branches are within normal limits. Antegrade flow in both ICA siphons. The left siphon is mildly dominant owing to dominance of the left  ACA A1 segment (normal variant). No siphon stenosis. Normal ophthalmic artery origins. Small infundibulum at the distal left ICA. Patent carotid termini. Normal MCA and ACA origins. Anterior communicating artery and visible ACA branches are within normal limits. Left MCA M1 segment, left MCA bifurcation and visible left MCA branches are within normal limits. Right MCA M1 segment, bifurcation, and visible right MCA branches are within normal limits. IMPRESSION: Negative intracranial MRA. MRI brain (without contrast would suffice)  would be necessary to evaluate for acute ischemia. Electronically Signed   By: Genevie Ann M.D.   On: 11/30/2018 13:04    Assessment & Plan:   There are no diagnoses linked to this encounter.   No orders of the defined types were placed in this encounter.    Follow-up: No follow-ups on file.  Walker Kehr, MD

## 2019-04-18 ENCOUNTER — Other Ambulatory Visit: Payer: Self-pay

## 2019-04-18 ENCOUNTER — Ambulatory Visit (HOSPITAL_COMMUNITY): Payer: PPO | Attending: Cardiology

## 2019-04-18 DIAGNOSIS — R600 Localized edema: Secondary | ICD-10-CM | POA: Diagnosis not present

## 2019-04-18 DIAGNOSIS — R531 Weakness: Secondary | ICD-10-CM

## 2019-04-18 DIAGNOSIS — I10 Essential (primary) hypertension: Secondary | ICD-10-CM | POA: Diagnosis not present

## 2019-04-22 ENCOUNTER — Telehealth: Payer: Self-pay | Admitting: *Deleted

## 2019-04-22 NOTE — Telephone Encounter (Signed)
-----   Message from Cassandria Anger, MD sent at 04/19/2019  2:42 PM EDT ----- Erline Levine, Please inform the patient that her echocardiogram is overall good.  Hopefully our irbesartan will help with her symptoms. Thanks, AP

## 2019-04-22 NOTE — Telephone Encounter (Signed)
Pt informed of below.  

## 2019-05-22 ENCOUNTER — Ambulatory Visit (INDEPENDENT_AMBULATORY_CARE_PROVIDER_SITE_OTHER): Payer: PPO | Admitting: Internal Medicine

## 2019-05-22 ENCOUNTER — Other Ambulatory Visit: Payer: Self-pay

## 2019-05-22 ENCOUNTER — Encounter: Payer: Self-pay | Admitting: Internal Medicine

## 2019-05-22 ENCOUNTER — Other Ambulatory Visit (INDEPENDENT_AMBULATORY_CARE_PROVIDER_SITE_OTHER): Payer: PPO

## 2019-05-22 VITALS — BP 130/90 | HR 71 | Temp 98.3°F | Ht 60.0 in | Wt 151.0 lb

## 2019-05-22 DIAGNOSIS — Z23 Encounter for immunization: Secondary | ICD-10-CM

## 2019-05-22 DIAGNOSIS — I10 Essential (primary) hypertension: Secondary | ICD-10-CM

## 2019-05-22 DIAGNOSIS — B078 Other viral warts: Secondary | ICD-10-CM

## 2019-05-22 DIAGNOSIS — R42 Dizziness and giddiness: Secondary | ICD-10-CM | POA: Diagnosis not present

## 2019-05-22 DIAGNOSIS — M810 Age-related osteoporosis without current pathological fracture: Secondary | ICD-10-CM

## 2019-05-22 DIAGNOSIS — E871 Hypo-osmolality and hyponatremia: Secondary | ICD-10-CM

## 2019-05-22 DIAGNOSIS — R600 Localized edema: Secondary | ICD-10-CM | POA: Diagnosis not present

## 2019-05-22 DIAGNOSIS — B079 Viral wart, unspecified: Secondary | ICD-10-CM | POA: Insufficient documentation

## 2019-05-22 LAB — BASIC METABOLIC PANEL
BUN: 14 mg/dL (ref 6–23)
CO2: 30 mEq/L (ref 19–32)
Calcium: 9.6 mg/dL (ref 8.4–10.5)
Chloride: 98 mEq/L (ref 96–112)
Creatinine, Ser: 0.68 mg/dL (ref 0.40–1.20)
GFR: 84.87 mL/min (ref >60.00)
Glucose, Bld: 94 mg/dL (ref 70–99)
Potassium: 4.1 mEq/L (ref 3.5–5.1)
Sodium: 136 mEq/L (ref 135–145)

## 2019-05-22 NOTE — Assessment & Plan Note (Signed)
L thigh x1 Cryo

## 2019-05-22 NOTE — Assessment & Plan Note (Signed)
Resolved

## 2019-05-22 NOTE — Assessment & Plan Note (Signed)
Orthostatic sx's vs BPV Take a half of Avapro x 2-3 d to see if better

## 2019-05-22 NOTE — Patient Instructions (Addendum)
Postprocedure instructions :     Keep the wounds clean. You can wash them with liquid soap and water. Pat dry with gauze or a Kleenex tissue  Before applying antibiotic ointment and a Band-Aid.   You need to report immediately  if  any signs of infection develop.       If you have medicare related insurance (such as traditional Medicare, [employer], [employer], or similar), Please make an appointment at the scheduling desk with Sharee Pimple, the [employer], for your Wellness visit in this office, which is a benefit with your insurance.    These suggestions will probably help you to improve your metabolism if you are not overweight and to lose weight if you are overweight: 1.  Reduce your consumption of sugars and starches.  Eliminate high fructose corn syrup from your diet.  Reduce your consumption of processed foods.  For desserts try to have seasonal fruits, berries, nuts, cheeses or dark chocolate with more than 70% cacao. 2.  Do not snack 3.  You do not have to eat breakfast.  If you choose to have breakfast-eat plain greek yogurt, eggs, oatmeal (without sugar) 4.  Drink water, freshly brewed unsweetened tea (green, black or herbal) or coffee.  Do not drink sodas including diet sodas , juices, beverages sweetened with artificial sweeteners. 5.  Reduce your consumption of refined grains. 6.  Avoid protein drinks such as Optifast, Slim fast etc. Eat chicken, fish, meat, dairy and beans for your sources of protein 7.  Natural unprocessed fats like cold pressed virgin olive oil, butter, coconut oil are good for you.  Eat avocados 8.  Increase your consumption of fiber.  Fruits, berries, vegetables, whole grains, flaxseeds, Chia seeds, beans, popcorn, nuts, oatmeal are good sources of fiber 9.  Use vinegar in your diet, i.e. apple cider vinegar, red wine or balsamic vinegar 10.  You can try fasting.  For example you can skip breakfast and lunch every other day  (24-hour fast) 11.  Stress reduction, good night sleep, relaxation, meditation, yoga and other physical activity is likely to help you to maintain low weight too. 12.  If you drink alcohol, limit your alcohol intake to no more than 2 drinks a day.   Mediterranean diet is good for you. ([employer] has a typical Mediterranean cuisine menu) The Mediterranean diet is a way of eating based on the traditional cuisine of countries bordering the [employer]. While there is no single definition of the Mediterranean diet, it is typically high in vegetables, fruits, whole grains, beans, nut and seeds, and olive oil. The main components of Mediterranean diet include: Marland Kitchen Daily consumption of vegetables, fruits, whole grains and healthy fats  . Weekly intake of fish, poultry, beans and eggs  . Moderate portions of dairy products  . Limited intake of red meat Other important elements of the Mediterranean diet are sharing meals with family and friends, enjoying a glass of red wine and being physically active. Health benefits of a Mediterranean diet: A traditional Mediterranean diet consisting of large quantities of fresh fruits and vegetables, nuts, fish and olive oil-coupled with physical activity-can reduce your risk of serious mental and physical health problems by: Preventing heart disease and strokes. Following a Mediterranean diet limits your intake of refined breads, processed foods, and red meat, and encourages drinking red wine instead of hard liquor-all factors that can help prevent heart disease and stroke. Keeping you agile. If you're an older adult, the nutrients gained  with a Mediterranean diet may reduce your risk of developing muscle weakness and other signs of frailty by about 70 percent. Reducing the risk of Alzheimer's. Research suggests that the [place] diet may improve cholesterol, blood sugar levels, and overall blood vessel health, which in turn may reduce your risk of  Alzheimer's disease or dementia. Halving the risk of Parkinson's disease. The high levels of antioxidants in the Mediterranean diet can prevent cells from undergoing a damaging process called oxidative stress, thereby cutting the risk of Parkinson's disease in half. Increasing longevity. By reducing your risk of developing heart disease or cancer with the Mediterranean diet, you're reducing your risk of death at any age by 20%. Protecting against type 2 diabetes. A Mediterranean diet is rich in fiber which digests slowly, prevents huge swings in blood sugar, and can help you maintain a healthy weight.    Cabbage soup recipe that will not make you gain weight: Take 1 small head of cabbage, 1 average pack of celery, 4 green peppers, 4 onions, 2 cans diced tomatoes (they are not available without salt), salt and spices to taste.  Chop cabbage, celery, peppers and onions.  And tomatoes and 2-2.5 liters (2.5 quarts) of water so that it would just cover the vegetables.  Bring to boil.  Add spices and salt.  Turn heat to low/medium and simmer for 20-25 minutes.  Naturally, you can make a smaller batch and change some of the ingredients.

## 2019-05-22 NOTE — Assessment & Plan Note (Signed)
Vit D 

## 2019-05-22 NOTE — Assessment & Plan Note (Signed)
BP Readings from Last 3 Encounters:  05/22/19 130/90  04/10/19 134/76  01/28/19 140/84

## 2019-05-22 NOTE — Progress Notes (Signed)
Subjective:  Patient ID: Alexandra Scott, female    DOB: 1946-08-28  Age: 73 y.o. MRN: [id number]  CC: No chief complaint on file.   HPI SHAWN EISENSTADT presents for R ankle rash C/o L thigh lesion F/u HTN  Outpatient Medications Prior to Visit  Medication Sig Dispense Refill  . ALPRAZolam (XANAX) 0.5 MG tablet Take 1 tab 1 hour prior to the MRI. Repeat in 1 hr if needed 6 tablet 0  . aspirin 81 MG EC tablet Take 81 mg by mouth at bedtime.     Marland Kitchen BIOTIN PO Take 1 tablet by mouth at bedtime.    . Calcium Carbonate-Vitamin D3 (CALCIUM 600/VITAMIN D) 600-400 MG-UNIT TABS Take 1 tablet by mouth 2 (two) times daily.     . irbesartan (AVAPRO) 150 MG tablet Take 1 tablet (150 mg total) by mouth daily. 90 tablet 3  . Magnesium 250 MG TABS Take 250 mg by mouth daily.     . Multiple Vitamins-Minerals (TH COMPLETE MULTI 50+ PO) Take by mouth.    . Omega-3 Fatty Acids (FISH OIL) 1000 MG CAPS Take 1,000 mg by mouth 2 (two) times daily. Reported on 01/11/2016    . Polyethyl Glycol-Propyl Glycol (SYSTANE OP) Place 1 drop into both eyes 2 (two) times daily as needed (dry eyes).    . triamcinolone cream (KENALOG) 0.5 % Apply 1 application topically 4 (four) times daily. 60 g 0  . Turmeric 500 MG TABS Take 500 mg by mouth 2 (two) times daily.     . vitamin C (ASCORBIC ACID) 500 MG tablet Take 500 mg by mouth at bedtime.      No facility-administered medications prior to visit.     ROS: Review of Systems  Constitutional: Negative for activity change, appetite change, chills, fatigue and unexpected weight change.  HENT: Negative for congestion, mouth sores and sinus pressure.   Eyes: Negative for visual disturbance.  Respiratory: Negative for cough and chest tightness.   Gastrointestinal: Negative for abdominal pain and nausea.  Genitourinary: Negative for difficulty urinating, frequency and vaginal pain.  Musculoskeletal: Negative for back pain and gait problem.  Skin: Positive for rash.  Negative for pallor.  Neurological: Negative for dizziness, tremors, weakness, numbness and headaches.  Psychiatric/Behavioral: Negative for confusion, sleep disturbance and suicidal ideas.    Objective:  BP 130/90 (BP Location: Left Arm, Patient Position: Sitting, Cuff Size: Normal)   Pulse 71   Temp 98.3 F (36.8 C) (Oral)   Ht 5' (1.524 m)   Wt 151 lb (68.5 kg)   SpO2 99%   BMI 29.49 kg/m   BP Readings from Last 3 Encounters:  05/22/19 130/90  04/10/19 134/76  01/28/19 140/84    Wt Readings from Last 3 Encounters:  05/22/19 151 lb (68.5 kg)  04/10/19 153 lb (69.4 kg)  01/09/19 157 lb (71.2 kg)    Physical Exam Constitutional:      General: She is not in acute distress.    Appearance: She is well-developed.  HENT:     Head: Normocephalic.     Right Ear: External ear normal.     Left Ear: External ear normal.     Nose: Nose normal.  Eyes:     General:        Right eye: No discharge.        Left eye: No discharge.     Conjunctiva/sclera: Conjunctivae normal.     Pupils: Pupils are equal, round, and reactive to light.  Neck:  Musculoskeletal: Normal range of motion and neck supple.     Thyroid: No thyromegaly.     Vascular: No JVD.     Trachea: No tracheal deviation.  Cardiovascular:     Rate and Rhythm: Normal rate and regular rhythm.     Heart sounds: Normal heart sounds.  Pulmonary:     Effort: No respiratory distress.     Breath sounds: No stridor. No wheezing.  Abdominal:     General: Bowel sounds are normal. There is no distension.     Palpations: Abdomen is soft. There is no mass.     Tenderness: There is no abdominal tenderness. There is no guarding or rebound.  Musculoskeletal:        General: No tenderness.  Lymphadenopathy:     Cervical: No cervical adenopathy.  Skin:    Findings: No erythema or rash.  Neurological:     Cranial Nerves: No cranial nerve deficit.     Motor: No abnormal muscle tone.     Coordination: Coordination normal.      Deep Tendon Reflexes: Reflexes normal.  Psychiatric:        Behavior: Behavior normal.        Thought Content: Thought content normal.        Judgment: Judgment normal.   rash on ankle L thigh wart    Procedure Note :     Procedure : Cryosurgery   Indication:  Wart(s)     Risks including unsuccessful procedure , bleeding, infection, bruising, scar, a need for a repeat  procedure and others were explained to the patient in detail as well as the benefits. Informed consent was obtained verbally.   1  lesion(s)  on LLE   was/were treated with liquid nitrogen on a Q-tip in a usual fasion . Band-Aid was applied and antibiotic ointment was given for a later use.   Tolerated well. Complications none.   Postprocedure instructions :     Keep the wounds clean. You can wash them with liquid soap and water. Pat dry with gauze or a Kleenex tissue  Before applying antibiotic ointment and a Band-Aid.   You need to report immediately  if  any signs of infection develop.    Lab Results  Component Value Date   WBC 9.3 11/20/2018   HGB 12.8 11/20/2018   HCT 38.1 11/20/2018   PLT 273.0 11/20/2018   GLUCOSE 104 (H) 01/09/2019   CHOL 147 02/07/2018   TRIG 70.0 02/07/2018   HDL 44.30 02/07/2018   LDLDIRECT 150.6 10/15/2007   LDLCALC 89 02/07/2018   ALT 25 10/17/2018   AST 21 10/17/2018   NA 135 01/09/2019   K 4.1 01/09/2019   CL 97 01/09/2019   CREATININE 0.71 01/09/2019   BUN 13 01/09/2019   CO2 29 01/09/2019   TSH 3.21 01/09/2019   INR 0.97 03/29/2011   HGBA1C 5.7 01/09/2019    Mr Jodene Nam Head Wo Contrast  Result Date: 11/30/2018 CLINICAL DATA:  73 year old female with ataxia, dizziness, vertigo, right lower extremity weakness. EXAM: MRA HEAD WITHOUT CONTRAST TECHNIQUE: Angiographic images of the Circle of Willis were obtained using MRA technique without intravenous contrast. COMPARISON:  None. FINDINGS: No intracranial mass effect or ventriculomegaly. Acute ischemia is not evaluated  with MRA technique. Antegrade flow in the posterior circulation with codominant distal vertebral arteries which are patent to the basilar without stenosis. Patent right PICA and dominant appearing left AICA origins. Patent basilar artery without stenosis. Normal SCA and PCA origins. Posterior communicating arteries  are diminutive or absent. Bilateral PCA branches are within normal limits. Antegrade flow in both ICA siphons. The left siphon is mildly dominant owing to dominance of the left ACA A1 segment (normal variant). No siphon stenosis. Normal ophthalmic artery origins. Small infundibulum at the distal left ICA. Patent carotid termini. Normal MCA and ACA origins. Anterior communicating artery and visible ACA branches are within normal limits. Left MCA M1 segment, left MCA bifurcation and visible left MCA branches are within normal limits. Right MCA M1 segment, bifurcation, and visible right MCA branches are within normal limits. IMPRESSION: Negative intracranial MRA. MRI brain (without contrast would suffice) would be necessary to evaluate for acute ischemia. Electronically Signed   By: Genevie Ann M.D.   On: 11/30/2018 13:04    Assessment & Plan:   There are no diagnoses linked to this encounter.   No orders of the defined types were placed in this encounter.    Follow-up: No follow-ups on file.  Walker Kehr, MD

## 2019-05-22 NOTE — Assessment & Plan Note (Signed)
Labs

## 2019-05-24 ENCOUNTER — Encounter: Payer: Self-pay | Admitting: *Deleted

## 2019-05-27 DIAGNOSIS — Z6829 Body mass index (BMI) 29.0-29.9, adult: Secondary | ICD-10-CM | POA: Diagnosis not present

## 2019-05-27 DIAGNOSIS — Z124 Encounter for screening for malignant neoplasm of cervix: Secondary | ICD-10-CM | POA: Diagnosis not present

## 2019-06-12 DIAGNOSIS — H5703 Miosis: Secondary | ICD-10-CM | POA: Diagnosis not present

## 2019-06-12 DIAGNOSIS — H02834 Dermatochalasis of left upper eyelid: Secondary | ICD-10-CM | POA: Diagnosis not present

## 2019-06-12 DIAGNOSIS — H0102B Squamous blepharitis left eye, upper and lower eyelids: Secondary | ICD-10-CM | POA: Diagnosis not present

## 2019-06-12 DIAGNOSIS — H0102A Squamous blepharitis right eye, upper and lower eyelids: Secondary | ICD-10-CM | POA: Diagnosis not present

## 2019-06-12 DIAGNOSIS — H04123 Dry eye syndrome of bilateral lacrimal glands: Secondary | ICD-10-CM | POA: Diagnosis not present

## 2019-06-12 DIAGNOSIS — H02831 Dermatochalasis of right upper eyelid: Secondary | ICD-10-CM | POA: Diagnosis not present

## 2019-06-12 DIAGNOSIS — H25813 Combined forms of age-related cataract, bilateral: Secondary | ICD-10-CM | POA: Diagnosis not present

## 2019-07-25 DIAGNOSIS — M1711 Unilateral primary osteoarthritis, right knee: Secondary | ICD-10-CM | POA: Diagnosis not present

## 2019-07-25 DIAGNOSIS — M25551 Pain in right hip: Secondary | ICD-10-CM | POA: Diagnosis not present

## 2019-08-14 ENCOUNTER — Ambulatory Visit: Payer: PPO

## 2019-08-14 ENCOUNTER — Encounter: Payer: Self-pay | Admitting: Internal Medicine

## 2019-08-14 ENCOUNTER — Other Ambulatory Visit: Payer: Self-pay

## 2019-08-14 ENCOUNTER — Ambulatory Visit (INDEPENDENT_AMBULATORY_CARE_PROVIDER_SITE_OTHER): Payer: PPO | Admitting: Internal Medicine

## 2019-08-14 ENCOUNTER — Other Ambulatory Visit (INDEPENDENT_AMBULATORY_CARE_PROVIDER_SITE_OTHER): Payer: PPO

## 2019-08-14 DIAGNOSIS — K219 Gastro-esophageal reflux disease without esophagitis: Secondary | ICD-10-CM

## 2019-08-14 DIAGNOSIS — E871 Hypo-osmolality and hyponatremia: Secondary | ICD-10-CM | POA: Diagnosis not present

## 2019-08-14 DIAGNOSIS — F419 Anxiety disorder, unspecified: Secondary | ICD-10-CM

## 2019-08-14 DIAGNOSIS — Z0000 Encounter for general adult medical examination without abnormal findings: Secondary | ICD-10-CM

## 2019-08-14 DIAGNOSIS — I10 Essential (primary) hypertension: Secondary | ICD-10-CM | POA: Diagnosis not present

## 2019-08-14 DIAGNOSIS — M1990 Unspecified osteoarthritis, unspecified site: Secondary | ICD-10-CM

## 2019-08-14 LAB — BASIC METABOLIC PANEL
BUN: 15 mg/dL (ref 6–23)
CO2: 31 mEq/L (ref 19–32)
Calcium: 9.6 mg/dL (ref 8.4–10.5)
Chloride: 97 mEq/L (ref 96–112)
Creatinine, Ser: 0.7 mg/dL (ref 0.40–1.20)
GFR: 82.03 mL/min (ref >60.00)
Glucose, Bld: 98 mg/dL (ref 70–99)
Potassium: 4.7 mEq/L (ref 3.5–5.1)
Sodium: 135 mEq/L (ref 135–145)

## 2019-08-14 MED ORDER — SHINGRIX 50 MCG/0.5ML IM SUSR: 0.5000 mL | Freq: Once | INTRAMUSCULAR | 1 refills | Status: AC

## 2019-08-14 NOTE — Assessment & Plan Note (Signed)
Irbesartan

## 2019-08-14 NOTE — Progress Notes (Signed)
Subjective:  Patient ID: Alexandra Scott, female    DOB: 24-Oct-1945  Age: 73 y.o. MRN: [id number]  CC: No chief complaint on file.   HPI Alexandra Scott presents for anxiety, HTN, OA f/u  C/o stress - son has a tumor of the sinuses  Outpatient Medications Prior to Visit  Medication Sig Dispense Refill  . ALPRAZolam (XANAX) 0.5 MG tablet Take 1 tab 1 hour prior to the MRI. Repeat in 1 hr if needed 6 tablet 0  . aspirin 81 MG EC tablet Take 81 mg by mouth at bedtime.     Marland Kitchen BIOTIN PO Take 1 tablet by mouth at bedtime.    . Calcium Carbonate-Vitamin D3 (CALCIUM 600/VITAMIN D) 600-400 MG-UNIT TABS Take 1 tablet by mouth 2 (two) times daily.     . irbesartan (AVAPRO) 150 MG tablet Take 1 tablet (150 mg total) by mouth daily. 90 tablet 3  . Magnesium 250 MG TABS Take 250 mg by mouth daily.     . Multiple Vitamins-Minerals (TH COMPLETE MULTI 50+ PO) Take by mouth.    . Omega-3 Fatty Acids (FISH OIL) 1000 MG CAPS Take 1,000 mg by mouth 2 (two) times daily. Reported on 01/11/2016    . Polyethyl Glycol-Propyl Glycol (SYSTANE OP) Place 1 drop into both eyes 2 (two) times daily as needed (dry eyes).    . triamcinolone cream (KENALOG) 0.5 % Apply 1 application topically 4 (four) times daily. 60 g 0  . Turmeric 500 MG TABS Take 500 mg by mouth 2 (two) times daily.     . vitamin C (ASCORBIC ACID) 500 MG tablet Take 500 mg by mouth at bedtime.      No facility-administered medications prior to visit.     ROS: Review of Systems  Constitutional: Negative for activity change, appetite change, chills, fatigue and unexpected weight change.  HENT: Negative for congestion, mouth sores and sinus pressure.   Eyes: Negative for visual disturbance.  Respiratory: Negative for cough and chest tightness.   Gastrointestinal: Negative for abdominal pain and nausea.  Genitourinary: Negative for difficulty urinating, frequency and vaginal pain.  Musculoskeletal: Positive for arthralgias. Negative for back  pain and gait problem.  Skin: Negative for pallor and rash.  Neurological: Negative for dizziness, tremors, weakness, numbness and headaches.  Psychiatric/Behavioral: Negative for confusion and sleep disturbance. The patient is nervous/anxious.     Objective:  BP 140/88 (BP Location: Left Arm, Patient Position: Sitting, Cuff Size: Normal)   Pulse 70   Temp 97.8 F (36.6 C) (Oral)   Ht 5' (1.524 m)   Wt 153 lb (69.4 kg)   SpO2 97%   BMI 29.88 kg/m   BP Readings from Last 3 Encounters:  08/14/19 140/88  05/22/19 130/90  04/10/19 134/76    Wt Readings from Last 3 Encounters:  08/14/19 153 lb (69.4 kg)  05/22/19 151 lb (68.5 kg)  04/10/19 153 lb (69.4 kg)    Physical Exam Constitutional:      General: She is not in acute distress.    Appearance: She is well-developed. She is obese.  HENT:     Head: Normocephalic.     Right Ear: External ear normal.     Left Ear: External ear normal.     Nose: Nose normal.  Eyes:     General:        Right eye: No discharge.        Left eye: No discharge.     Conjunctiva/sclera: Conjunctivae normal.  Pupils: Pupils are equal, round, and reactive to light.  Neck:     Musculoskeletal: Normal range of motion and neck supple.     Thyroid: No thyromegaly.     Vascular: No JVD.     Trachea: No tracheal deviation.  Cardiovascular:     Rate and Rhythm: Normal rate and regular rhythm.     Heart sounds: Normal heart sounds.  Pulmonary:     Effort: No respiratory distress.     Breath sounds: No stridor. No wheezing.  Abdominal:     General: Bowel sounds are normal. There is no distension.     Palpations: Abdomen is soft. There is no mass.     Tenderness: There is no abdominal tenderness. There is no guarding or rebound.  Musculoskeletal:        General: Tenderness present.  Lymphadenopathy:     Cervical: No cervical adenopathy.  Skin:    Findings: No erythema or rash.  Neurological:     Mental Status: She is oriented to person,  place, and time.     Cranial Nerves: No cranial nerve deficit.     Motor: No abnormal muscle tone.     Coordination: Coordination normal.     Deep Tendon Reflexes: Reflexes normal.  Psychiatric:        Behavior: Behavior normal.        Thought Content: Thought content normal.        Judgment: Judgment normal.     Lab Results  Component Value Date   WBC 9.3 11/20/2018   HGB 12.8 11/20/2018   HCT 38.1 11/20/2018   PLT 273.0 11/20/2018   GLUCOSE 94 05/22/2019   CHOL 147 02/07/2018   TRIG 70.0 02/07/2018   HDL 44.30 02/07/2018   LDLDIRECT 150.6 10/15/2007   LDLCALC 89 02/07/2018   ALT 25 10/17/2018   AST 21 10/17/2018   NA 136 05/22/2019   K 4.1 05/22/2019   CL 98 05/22/2019   CREATININE 0.68 05/22/2019   BUN 14 05/22/2019   CO2 30 05/22/2019   TSH 3.21 01/09/2019   INR 0.97 03/29/2011   HGBA1C 5.7 01/09/2019    Mr Jodene Nam Head Wo Contrast  Result Date: 11/30/2018 CLINICAL DATA:  73 year old female with ataxia, dizziness, vertigo, right lower extremity weakness. EXAM: MRA HEAD WITHOUT CONTRAST TECHNIQUE: Angiographic images of the Circle of Willis were obtained using MRA technique without intravenous contrast. COMPARISON:  None. FINDINGS: No intracranial mass effect or ventriculomegaly. Acute ischemia is not evaluated with MRA technique. Antegrade flow in the posterior circulation with codominant distal vertebral arteries which are patent to the basilar without stenosis. Patent right PICA and dominant appearing left AICA origins. Patent basilar artery without stenosis. Normal SCA and PCA origins. Posterior communicating arteries are diminutive or absent. Bilateral PCA branches are within normal limits. Antegrade flow in both ICA siphons. The left siphon is mildly dominant owing to dominance of the left ACA A1 segment (normal variant). No siphon stenosis. Normal ophthalmic artery origins. Small infundibulum at the distal left ICA. Patent carotid termini. Normal MCA and ACA origins.  Anterior communicating artery and visible ACA branches are within normal limits. Left MCA M1 segment, left MCA bifurcation and visible left MCA branches are within normal limits. Right MCA M1 segment, bifurcation, and visible right MCA branches are within normal limits. IMPRESSION: Negative intracranial MRA. MRI brain (without contrast would suffice) would be necessary to evaluate for acute ischemia. Electronically Signed   By: Genevie Ann M.D.   On: 11/30/2018 13:04  Assessment & Plan:   There are no diagnoses linked to this encounter.   No orders of the defined types were placed in this encounter.    Follow-up: No follow-ups on file.  Walker Kehr, MD

## 2019-08-14 NOTE — Assessment & Plan Note (Signed)
Protonix.  ?

## 2019-08-14 NOTE — Patient Instructions (Signed)
If you have medicare related insurance (such as traditional Medicare, Blue Cross Medicare, United HealthCare Medicare, or similar), Please make an appointment at the scheduling desk with Jill, the Wellness Health Coach, for your Wellness visit in this office, which is a benefit with your insurance.  

## 2019-08-14 NOTE — Assessment & Plan Note (Signed)
Xanax prn rare use Stress discussed

## 2019-08-14 NOTE — Assessment & Plan Note (Signed)
Tylenol prn Ortho f/u for R knee pain

## 2019-09-05 ENCOUNTER — Other Ambulatory Visit: Payer: Self-pay | Admitting: *Deleted

## 2019-09-05 NOTE — Patient Outreach (Signed)
[facility]) Care Management  09/05/2019  [name] Apr 25, 1946 [id number]   Case reviewed, no patient outreach needed,  and case closed per [name], Assistant Clinical Director at [place]  request.   [name]. [name], BSN, [place] [facility] Telephonic CM Phone: [phone] Fax: [phone]

## 2019-11-18 DIAGNOSIS — Z1231 Encounter for screening mammogram for malignant neoplasm of breast: Secondary | ICD-10-CM | POA: Diagnosis not present

## 2019-12-12 ENCOUNTER — Other Ambulatory Visit: Payer: Self-pay

## 2019-12-12 ENCOUNTER — Encounter: Payer: Self-pay | Admitting: Internal Medicine

## 2019-12-12 ENCOUNTER — Ambulatory Visit (INDEPENDENT_AMBULATORY_CARE_PROVIDER_SITE_OTHER): Payer: PPO | Admitting: Internal Medicine

## 2019-12-12 DIAGNOSIS — I10 Essential (primary) hypertension: Secondary | ICD-10-CM | POA: Diagnosis not present

## 2019-12-12 DIAGNOSIS — K219 Gastro-esophageal reflux disease without esophagitis: Secondary | ICD-10-CM | POA: Diagnosis not present

## 2019-12-12 DIAGNOSIS — J3489 Other specified disorders of nose and nasal sinuses: Secondary | ICD-10-CM | POA: Diagnosis not present

## 2019-12-12 DIAGNOSIS — R059 Cough, unspecified: Secondary | ICD-10-CM

## 2019-12-12 DIAGNOSIS — R05 Cough: Secondary | ICD-10-CM | POA: Diagnosis not present

## 2019-12-12 MED ORDER — IRBESARTAN 300 MG PO TABS: 300.0000 mg | ORAL_TABLET | Freq: Every day | ORAL | 3 refills | Status: DC

## 2019-12-12 MED ORDER — BENZONATATE 200 MG PO CAPS: 200.0000 mg | ORAL_CAPSULE | Freq: Three times a day (TID) | ORAL | 0 refills | Status: DC | PRN

## 2019-12-12 MED ORDER — LORATADINE 10 MG PO TABS: 10.0000 mg | ORAL_TABLET | Freq: Every day | ORAL | 3 refills | Status: DC

## 2019-12-12 NOTE — Patient Instructions (Signed)
FEMA mass vaccine site in [place]:  Call the COVID-19 Vaccine Help Center at [phone] to schedule your shot at [place].   [place] federal vaccine clinic is preparing to open on Wednesday 12/04/19. The FEMA site at [place] has the capacity to vaccinate 3,000 people a day for eight weeks. That's nearly 170,000 doses just from this one clinic.   With such a big operation, here are answers to some questions you may have about the drive-thru and indoor site:  How do I make an appointment?   Head to [email or web address] to schedule your appointment indoors or in the drive-thru, or call the COVID-19 Vaccine Help Center at [phone].  What if I need to change or cancel my appointment, or have more questions?   Call the COVID-19 Vaccine Help Center at [phone].  What vaccines will be available at the clinic?   The vaccine clinic will begin giving both Pfizer and Moderna two-dose COVID-19 vaccines. The single-dose [employer] vaccines will be given during the last two weeks of the clinic.   What part of the [place] do I enter for my appointment?  Enter from [address] and turn onto [address]. A clinic staff member will confirm your appointment for the day. You'll then either be directed to registration or to a waiting area until your appointment time.  Can I be seen sooner if I'm early?  Those who are early will park in a designated waiting area until their vaccine appointment time approaches.  How does registration work?  There are five open lanes for registration. Someone will get the necessary information needed to confirm appointments and other details to keep a record of who's getting the vaccine every day. Temperatures will be checked to make sure you're in good shape to get the vaccine.  How long will it take to get my shot?  You'll park your car in a long tent with about 10  other vehicles. All 10 people in that group will get a shot inside their vehicles. Getting the actual shot only takes a few minutes. The entire process takes about 30 minutes.  How does the observation period work?  All patients will wait in the same tent they got their vaccine at for 15 minutes.       

## 2019-12-12 NOTE — Assessment & Plan Note (Signed)
NS spray

## 2019-12-12 NOTE — Assessment & Plan Note (Signed)
Worse Increase Irbesartan to 300 mg/d

## 2019-12-12 NOTE — Progress Notes (Signed)
Subjective:  Patient ID: Alexandra Scott, female    DOB: [birth year]  Age: 74 y.o. MRN: [id number]  CC: No chief complaint on file.   HPI Alexandra Scott presents for elevated BP C/o dry cough x 1 month F/u HTN, anxiety, OA  Outpatient Medications Prior to Visit  Medication Sig Dispense Refill  . aspirin 81 MG EC tablet Take 81 mg by mouth at bedtime.     Marland Kitchen BIOTIN PO Take 1 tablet by mouth at bedtime.    . Calcium Carbonate-Vitamin D3 (CALCIUM 600/VITAMIN D) 600-400 MG-UNIT TABS Take 1 tablet by mouth 2 (two) times daily.     . irbesartan (AVAPRO) 150 MG tablet Take 1 tablet (150 mg total) by mouth daily. 90 tablet 3  . Magnesium 250 MG TABS Take 250 mg by mouth daily.     . Multiple Vitamins-Minerals (TH COMPLETE MULTI 50+ PO) Take by mouth.    . Omega-3 Fatty Acids (FISH OIL) 1000 MG CAPS Take 1,000 mg by mouth 2 (two) times daily. Reported on 01/11/2016    . Polyethyl Glycol-Propyl Glycol (SYSTANE OP) Place 1 drop into both eyes 2 (two) times daily as needed (dry eyes).    . triamcinolone cream (KENALOG) 0.5 % Apply 1 application topically 4 (four) times daily. 60 g 0  . Turmeric 500 MG TABS Take 500 mg by mouth 2 (two) times daily.     . vitamin C (ASCORBIC ACID) 500 MG tablet Take 500 mg by mouth at bedtime.     . ALPRAZolam (XANAX) 0.5 MG tablet Take 1 tab 1 hour prior to the MRI. Repeat in 1 hr if needed (Patient not taking: Reported on 12/12/2019) 6 tablet 0   No facility-administered medications prior to visit.    ROS: Review of Systems  Constitutional: Negative for activity change, appetite change, chills, fatigue and unexpected weight change.  HENT: Negative for congestion, mouth sores and sinus pressure.   Eyes: Negative for visual disturbance.  Respiratory: Negative for cough and chest tightness.   Gastrointestinal: Negative for abdominal pain and nausea.  Genitourinary: Negative for difficulty urinating, frequency and vaginal pain.  Musculoskeletal: Positive for  arthralgias and back pain. Negative for gait problem.  Skin: Negative for pallor and rash.  Neurological: Negative for dizziness, tremors, weakness, numbness and headaches.  Psychiatric/Behavioral: Negative for confusion and sleep disturbance.    Objective:  BP (!) 164/90 (BP Location: Left Arm, Patient Position: Sitting, Cuff Size: Normal)   Pulse 89   Temp 98.6 F (37 C) (Oral)   Ht 5' (1.524 m)   Wt 157 lb (71.2 kg)   SpO2 99%   BMI 30.66 kg/m   BP Readings from Last 3 Encounters:  12/12/19 (!) 164/90  08/14/19 140/88  05/22/19 130/90    Wt Readings from Last 3 Encounters:  12/12/19 157 lb (71.2 kg)  08/14/19 153 lb (69.4 kg)  05/22/19 151 lb (68.5 kg)    Physical Exam Constitutional:      General: She is not in acute distress.    Appearance: She is well-developed.  HENT:     Head: Normocephalic.     Right Ear: External ear normal.     Left Ear: External ear normal.     Nose: Nose normal.  Eyes:     General:        Right eye: No discharge.        Left eye: No discharge.     Conjunctiva/sclera: Conjunctivae normal.     Pupils: Pupils  are equal, round, and reactive to light.  Neck:     Thyroid: No thyromegaly.     Vascular: No JVD.     Trachea: No tracheal deviation.  Cardiovascular:     Rate and Rhythm: Normal rate and regular rhythm.     Heart sounds: Normal heart sounds.  Pulmonary:     Effort: No respiratory distress.     Breath sounds: No stridor. No wheezing.  Abdominal:     General: Bowel sounds are normal. There is no distension.     Palpations: Abdomen is soft. There is no mass.     Tenderness: There is no abdominal tenderness. There is no guarding or rebound.  Musculoskeletal:        General: No tenderness.     Cervical back: Normal range of motion and neck supple.  Lymphadenopathy:     Cervical: No cervical adenopathy.  Skin:    Findings: No erythema or rash.  Neurological:     Cranial Nerves: No cranial nerve deficit.     Motor: No  abnormal muscle tone.     Coordination: Coordination normal.     Deep Tendon Reflexes: Reflexes normal.  Psychiatric:        Behavior: Behavior normal.        Thought Content: Thought content normal.        Judgment: Judgment normal.     Lab Results  Component Value Date   WBC 9.3 11/20/2018   HGB 12.8 11/20/2018   HCT 38.1 11/20/2018   PLT 273.0 11/20/2018   GLUCOSE 98 08/14/2019   CHOL 147 02/07/2018   TRIG 70.0 02/07/2018   HDL 44.30 02/07/2018   LDLDIRECT 150.6 10/15/2007   LDLCALC 89 02/07/2018   ALT 25 10/17/2018   AST 21 10/17/2018   NA 135 08/14/2019   K 4.7 08/14/2019   CL 97 08/14/2019   CREATININE 0.70 08/14/2019   BUN 15 08/14/2019   CO2 31 08/14/2019   TSH 3.21 01/09/2019   INR 0.97 03/29/2011   HGBA1C 5.7 01/09/2019    MR MRA HEAD WO CONTRAST  Result Date: 11/30/2018 CLINICAL DATA:  74 year old female with ataxia, dizziness, vertigo, right lower extremity weakness. EXAM: MRA HEAD WITHOUT CONTRAST TECHNIQUE: Angiographic images of the Circle of Willis were obtained using MRA technique without intravenous contrast. COMPARISON:  None. FINDINGS: No intracranial mass effect or ventriculomegaly. Acute ischemia is not evaluated with MRA technique. Antegrade flow in the posterior circulation with codominant distal vertebral arteries which are patent to the basilar without stenosis. Patent right PICA and dominant appearing left AICA origins. Patent basilar artery without stenosis. Normal SCA and PCA origins. Posterior communicating arteries are diminutive or absent. Bilateral PCA branches are within normal limits. Antegrade flow in both ICA siphons. The left siphon is mildly dominant owing to dominance of the left ACA A1 segment (normal variant). No siphon stenosis. Normal ophthalmic artery origins. Small infundibulum at the distal left ICA. Patent carotid termini. Normal MCA and ACA origins. Anterior communicating artery and visible ACA branches are within normal limits.  Left MCA M1 segment, left MCA bifurcation and visible left MCA branches are within normal limits. Right MCA M1 segment, bifurcation, and visible right MCA branches are within normal limits. IMPRESSION: Negative intracranial MRA. MRI brain (without contrast would suffice) would be necessary to evaluate for acute ischemia. Electronically Signed   By: Genevie Ann M.D.   On: 11/30/2018 13:04    Assessment & Plan:    Walker Kehr, MD

## 2019-12-12 NOTE — Assessment & Plan Note (Signed)
Relapsed Start Claritin CXR if not better

## 2019-12-12 NOTE — Assessment & Plan Note (Signed)
Protonix.  ?

## 2020-01-23 ENCOUNTER — Ambulatory Visit (INDEPENDENT_AMBULATORY_CARE_PROVIDER_SITE_OTHER): Payer: PPO

## 2020-01-23 ENCOUNTER — Other Ambulatory Visit: Payer: Self-pay

## 2020-01-23 ENCOUNTER — Ambulatory Visit (INDEPENDENT_AMBULATORY_CARE_PROVIDER_SITE_OTHER): Payer: PPO | Admitting: Internal Medicine

## 2020-01-23 ENCOUNTER — Encounter: Payer: Self-pay | Admitting: Internal Medicine

## 2020-01-23 VITALS — BP 160/84 | HR 80 | Temp 98.0°F | Ht 60.0 in | Wt 159.0 lb

## 2020-01-23 DIAGNOSIS — I10 Essential (primary) hypertension: Secondary | ICD-10-CM

## 2020-01-23 DIAGNOSIS — R600 Localized edema: Secondary | ICD-10-CM | POA: Diagnosis not present

## 2020-01-23 DIAGNOSIS — M4802 Spinal stenosis, cervical region: Secondary | ICD-10-CM | POA: Diagnosis not present

## 2020-01-23 DIAGNOSIS — R202 Paresthesia of skin: Secondary | ICD-10-CM

## 2020-01-23 DIAGNOSIS — M47812 Spondylosis without myelopathy or radiculopathy, cervical region: Secondary | ICD-10-CM | POA: Diagnosis not present

## 2020-01-23 DIAGNOSIS — R739 Hyperglycemia, unspecified: Secondary | ICD-10-CM | POA: Diagnosis not present

## 2020-01-23 DIAGNOSIS — L659 Nonscarring hair loss, unspecified: Secondary | ICD-10-CM

## 2020-01-23 LAB — BASIC METABOLIC PANEL
BUN: 14 mg/dL (ref 6–23)
CO2: 29 mEq/L (ref 19–32)
Calcium: 9.7 mg/dL (ref 8.4–10.5)
Chloride: 97 mEq/L (ref 96–112)
Creatinine, Ser: 0.77 mg/dL (ref 0.40–1.20)
GFR: 73.39 mL/min (ref >60.00)
Glucose, Bld: 108 mg/dL — ABNORMAL HIGH (ref 70–99)
Potassium: 4 mEq/L (ref 3.5–5.1)
Sodium: 134 mEq/L — ABNORMAL LOW (ref 135–145)

## 2020-01-23 LAB — HEMOGLOBIN A1C: Hgb A1c MFr Bld: 5.7 % (ref 4.6–6.5)

## 2020-01-23 MED ORDER — AMLODIPINE BESYLATE 5 MG PO TABS: 5.0000 mg | ORAL_TABLET | Freq: Every day | ORAL | 3 refills | Status: DC

## 2020-01-23 NOTE — Assessment & Plan Note (Signed)
Watch for edema on Amlodipine

## 2020-01-23 NOTE — Progress Notes (Signed)
Subjective:  Patient ID: Alexandra Scott, female    DOB: [birth year]  Age: 74 y.o. MRN: [id number]  CC: No chief complaint on file.   HPI MIDAJAH DOUDNA presents for HTN and cough f/u. Cough has resolved. C/o leg weakness, pain C/o tingling L hand fingers #2-5 are tingling (L handed)  Outpatient Medications Prior to Visit  Medication Sig Dispense Refill  . aspirin 81 MG EC tablet Take 81 mg by mouth at bedtime.     Marland Kitchen BIOTIN PO Take 1 tablet by mouth at bedtime.    . Calcium Carbonate-Vitamin D3 (CALCIUM 600/VITAMIN D) 600-400 MG-UNIT TABS Take 1 tablet by mouth 2 (two) times daily.     . irbesartan (AVAPRO) 300 MG tablet Take 1 tablet (300 mg total) by mouth daily. 90 tablet 3  . Magnesium 250 MG TABS Take 250 mg by mouth daily.     . Multiple Vitamins-Minerals (TH COMPLETE MULTI 50+ PO) Take by mouth.    . Omega-3 Fatty Acids (FISH OIL) 1000 MG CAPS Take 1,000 mg by mouth 2 (two) times daily. Reported on 01/11/2016    . Polyethyl Glycol-Propyl Glycol (SYSTANE OP) Place 1 drop into both eyes 2 (two) times daily as needed (dry eyes).    . triamcinolone cream (KENALOG) 0.5 % Apply 1 application topically 4 (four) times daily. 60 g 0  . Turmeric 500 MG TABS Take 500 mg by mouth 2 (two) times daily.     . vitamin C (ASCORBIC ACID) 500 MG tablet Take 500 mg by mouth at bedtime.     . benzonatate (TESSALON) 200 MG capsule Take 1 capsule (200 mg total) by mouth 3 (three) times daily as needed for cough. (Patient not taking: Reported on 01/23/2020) 60 capsule 0  . loratadine (CLARITIN) 10 MG tablet Take 1 tablet (10 mg total) by mouth daily. (Patient not taking: Reported on 01/23/2020) 90 tablet 3   No facility-administered medications prior to visit.    ROS: Review of Systems  Constitutional: Negative for activity change, appetite change, chills, fatigue and unexpected weight change.  HENT: Negative for congestion, mouth sores and sinus pressure.   Eyes: Negative for visual disturbance.   Respiratory: Negative for cough and chest tightness.   Gastrointestinal: Negative for abdominal pain and nausea.  Genitourinary: Negative for difficulty urinating, frequency and vaginal pain.  Musculoskeletal: Negative for back pain and gait problem.  Skin: Negative for pallor and rash.  Neurological: Positive for numbness. Negative for dizziness, tremors, weakness and headaches.  Psychiatric/Behavioral: Negative for confusion and sleep disturbance.    Objective:  BP (!) 160/84 (BP Location: Left Arm, Patient Position: Sitting, Cuff Size: Normal)   Pulse 80   Temp 98 F (36.7 C) (Oral)   Ht 5' (1.524 m)   Wt 159 lb (72.1 kg)   SpO2 97%   BMI 31.05 kg/m   BP Readings from Last 3 Encounters:  01/23/20 (!) 160/84  12/12/19 (!) 164/90  08/14/19 140/88    Wt Readings from Last 3 Encounters:  01/23/20 159 lb (72.1 kg)  12/12/19 157 lb (71.2 kg)  08/14/19 153 lb (69.4 kg)    Physical Exam Constitutional:      General: She is not in acute distress.    Appearance: She is well-developed. She is obese.  HENT:     Head: Normocephalic.     Right Ear: External ear normal.     Left Ear: External ear normal.     Nose: Nose normal.  Eyes:  General:        Right eye: No discharge.        Left eye: No discharge.     Conjunctiva/sclera: Conjunctivae normal.     Pupils: Pupils are equal, round, and reactive to light.  Neck:     Thyroid: No thyromegaly.     Vascular: No JVD.     Trachea: No tracheal deviation.  Cardiovascular:     Rate and Rhythm: Normal rate and regular rhythm.     Heart sounds: Normal heart sounds.  Pulmonary:     Effort: No respiratory distress.     Breath sounds: No stridor. No wheezing.  Abdominal:     General: Bowel sounds are normal. There is no distension.     Palpations: Abdomen is soft. There is no mass.     Tenderness: There is no abdominal tenderness. There is no guarding or rebound.  Musculoskeletal:        General: No tenderness.      Cervical back: Normal range of motion and neck supple.  Lymphadenopathy:     Cervical: No cervical adenopathy.  Skin:    Findings: No erythema or rash.  Neurological:     Cranial Nerves: No cranial nerve deficit.     Motor: No abnormal muscle tone.     Coordination: Coordination normal.     Deep Tendon Reflexes: Reflexes normal.  Psychiatric:        Behavior: Behavior normal.        Thought Content: Thought content normal.        Judgment: Judgment normal.     Lab Results  Component Value Date   WBC 9.3 11/20/2018   HGB 12.8 11/20/2018   HCT 38.1 11/20/2018   PLT 273.0 11/20/2018   GLUCOSE 98 08/14/2019   CHOL 147 02/07/2018   TRIG 70.0 02/07/2018   HDL 44.30 02/07/2018   LDLDIRECT 150.6 10/15/2007   LDLCALC 89 02/07/2018   ALT 25 10/17/2018   AST 21 10/17/2018   NA 135 08/14/2019   K 4.7 08/14/2019   CL 97 08/14/2019   CREATININE 0.70 08/14/2019   BUN 15 08/14/2019   CO2 31 08/14/2019   TSH 3.21 01/09/2019   INR 0.97 03/29/2011   HGBA1C 5.7 01/09/2019    MR MRA HEAD WO CONTRAST  Result Date: 11/30/2018 CLINICAL DATA:  74 year old female with ataxia, dizziness, vertigo, right lower extremity weakness. EXAM: MRA HEAD WITHOUT CONTRAST TECHNIQUE: Angiographic images of the Circle of Willis were obtained using MRA technique without intravenous contrast. COMPARISON:  None. FINDINGS: No intracranial mass effect or ventriculomegaly. Acute ischemia is not evaluated with MRA technique. Antegrade flow in the posterior circulation with codominant distal vertebral arteries which are patent to the basilar without stenosis. Patent right PICA and dominant appearing left AICA origins. Patent basilar artery without stenosis. Normal SCA and PCA origins. Posterior communicating arteries are diminutive or absent. Bilateral PCA branches are within normal limits. Antegrade flow in both ICA siphons. The left siphon is mildly dominant owing to dominance of the left ACA A1 segment (normal variant).  No siphon stenosis. Normal ophthalmic artery origins. Small infundibulum at the distal left ICA. Patent carotid termini. Normal MCA and ACA origins. Anterior communicating artery and visible ACA branches are within normal limits. Left MCA M1 segment, left MCA bifurcation and visible left MCA branches are within normal limits. Right MCA M1 segment, bifurcation, and visible right MCA branches are within normal limits. IMPRESSION: Negative intracranial MRA. MRI brain (without contrast would suffice) would be  necessary to evaluate for acute ischemia. Electronically Signed   By: Genevie Ann M.D.   On: 11/30/2018 13:04    Assessment & Plan:    Walker Kehr, MD

## 2020-01-23 NOTE — Assessment & Plan Note (Signed)
Add Amlodipine 5 mg

## 2020-01-23 NOTE — Assessment & Plan Note (Signed)
Better  

## 2020-01-23 NOTE — Addendum Note (Signed)
Addended by: Boris Lown B on: 01/23/2020 10:35 AM   Modules accepted: Orders

## 2020-01-23 NOTE — Assessment & Plan Note (Signed)
C/o tingling L hand fingers #2-5 are tingling (L handed) X ray C spine B12 was ok

## 2020-04-23 ENCOUNTER — Ambulatory Visit (INDEPENDENT_AMBULATORY_CARE_PROVIDER_SITE_OTHER): Payer: PPO

## 2020-04-23 ENCOUNTER — Other Ambulatory Visit: Payer: Self-pay

## 2020-04-23 ENCOUNTER — Encounter: Payer: Self-pay | Admitting: Internal Medicine

## 2020-04-23 ENCOUNTER — Ambulatory Visit (INDEPENDENT_AMBULATORY_CARE_PROVIDER_SITE_OTHER): Payer: PPO | Admitting: Internal Medicine

## 2020-04-23 VITALS — BP 140/80 | HR 91 | Temp 98.2°F | Resp 16 | Ht 60.0 in | Wt 160.8 lb

## 2020-04-23 DIAGNOSIS — R739 Hyperglycemia, unspecified: Secondary | ICD-10-CM | POA: Diagnosis not present

## 2020-04-23 DIAGNOSIS — R29898 Other symptoms and signs involving the musculoskeletal system: Secondary | ICD-10-CM | POA: Diagnosis not present

## 2020-04-23 DIAGNOSIS — Z0000 Encounter for general adult medical examination without abnormal findings: Secondary | ICD-10-CM | POA: Diagnosis not present

## 2020-04-23 DIAGNOSIS — E559 Vitamin D deficiency, unspecified: Secondary | ICD-10-CM

## 2020-04-23 DIAGNOSIS — D649 Anemia, unspecified: Secondary | ICD-10-CM

## 2020-04-23 DIAGNOSIS — M5440 Lumbago with sciatica, unspecified side: Secondary | ICD-10-CM | POA: Diagnosis not present

## 2020-04-23 DIAGNOSIS — R202 Paresthesia of skin: Secondary | ICD-10-CM

## 2020-04-23 LAB — COMPLETE METABOLIC PANEL WITH GFR
AG Ratio: 1.5 (calc) (ref 1.0–2.5)
ALT: 26 U/L (ref 6–29)
AST: 25 U/L (ref 10–35)
Albumin: 4.6 g/dL (ref 3.6–5.1)
Alkaline phosphatase (APISO): 84 U/L (ref 37–153)
BUN: 15 mg/dL (ref 7–25)
CO2: 27 mmol/L (ref 20–32)
Calcium: 10 mg/dL (ref 8.6–10.4)
Chloride: 99 mmol/L (ref 98–110)
Creat: 0.75 mg/dL (ref 0.60–0.93)
GFR, Est African American: 92 mL/min/{1.73_m2} (ref >=60)
GFR, Est Non African American: 79 mL/min/{1.73_m2} (ref >=60)
Globulin: 3 g/dL (calc) (ref 1.9–3.7)
Glucose, Bld: 111 mg/dL — ABNORMAL HIGH (ref 65–99)
Potassium: 4.5 mmol/L (ref 3.5–5.3)
Sodium: 136 mmol/L (ref 135–146)
Total Bilirubin: 0.8 mg/dL (ref 0.2–1.2)
Total Protein: 7.6 g/dL (ref 6.1–8.1)

## 2020-04-23 NOTE — Patient Instructions (Signed)
[name] , Thank you for taking time to come for your Medicare Wellness Visit. I appreciate your ongoing commitment to your health goals. Please review the following plan we discussed and let me know if I can assist you in the future.   Screening recommendations/referrals: Colonoscopy: 07/08/2015; due every 5 years Mammogram: 11/18/2019; due every 2 years Bone Density: 01/16/2017; due every 2 years Recommended yearly ophthalmology/optometry visit for glaucoma screening and checkup Recommended yearly dental visit for hygiene and checkup  Vaccinations: Influenza vaccine: 05/22/2019 Pneumococcal vaccine: completed Tdap vaccine: 11/19/2010; due every 10 years Shingles vaccine: never done   Covid-19: never done  Advanced directives: Advance directive discussed with you today. Even though you declined this today please call our office should you change your mind and we can give you the proper paperwork for you to fill out.   Conditions/risks identified: Yes; Please continue to do your personal lifestyle choices by: daily care of teeth and gums, regular physical activity (goal should be 5 days a week for 30 minutes), eat a healthy diet, avoid tobacco and drug use, limiting any alcohol intake, taking a low-dose aspirin (if not allergic or have been advised by your provider otherwise) and taking vitamins and minerals as recommended by your provider. Continue doing brain stimulating activities (puzzles, reading, adult coloring books, staying active) to keep memory sharp. Continue to eat heart healthy diet (full of fruits, vegetables, whole grains, lean protein, water--limit salt, fat, and sugar intake) and increase physical activity as tolerated.  Next appointment: Please schedule your next Medicare Wellness Visit with your Nurse Health Advisor in 1 year.  Preventive Care 74 Years and Older, Female Preventive care refers to lifestyle choices and visits with your health care provider that can promote  health and wellness. What does preventive care include?  A yearly physical exam. This is also called an annual well check.  Dental exams once or twice a year.  Routine eye exams. Ask your health care provider how often you should have your eyes checked.  Personal lifestyle choices, including:  Daily care of your teeth and gums.  Regular physical activity.  Eating a healthy diet.  Avoiding tobacco and drug use.  Limiting alcohol use.  Practicing safe sex.  Taking low-dose aspirin every day.  Taking vitamin and mineral supplements as recommended by your health care provider. What happens during an annual well check? The services and screenings done by your health care provider during your annual well check will depend on your age, overall health, lifestyle risk factors, and family history of disease. Counseling  Your health care provider may ask you questions about your:  Alcohol use.  Tobacco use.  Drug use.  Emotional well-being.  Home and relationship well-being.  Sexual activity.  Eating habits.  History of falls.  Memory and ability to understand (cognition).  Work and work Statistician.  Reproductive health. Screening  You may have the following tests or measurements:  Height, weight, and BMI.  Blood pressure.  Lipid and cholesterol levels. These may be checked every 5 years, or more frequently if you are over 23 years old.  Skin check.  Lung cancer screening. You may have this screening every year starting at age 37 if you have a 30-pack-year history of smoking and currently smoke or have quit within the past 15 years.  Fecal occult blood test (FOBT) of the stool. You may have this test every year starting at age 34.  Flexible sigmoidoscopy or colonoscopy. You may have a sigmoidoscopy every  5 years or a colonoscopy every 10 years starting at age 70.  Hepatitis C blood test.  Hepatitis B blood test.  Sexually transmitted disease (STD)  testing.  Diabetes screening. This is done by checking your blood sugar (glucose) after you have not eaten for a while (fasting). You may have this done every 1-3 years.  Bone density scan. This is done to screen for osteoporosis. You may have this done starting at age 41.  Mammogram. This may be done every 1-2 years. Talk to your health care provider about how often you should have regular mammograms. Talk with your health care provider about your test results, treatment options, and if necessary, the need for more tests. Vaccines  Your health care provider may recommend certain vaccines, such as:  Influenza vaccine. This is recommended every year.  Tetanus, diphtheria, and acellular pertussis (Tdap, Td) vaccine. You may need a Td booster every 10 years.  Zoster vaccine. You may need this after age 89.  Pneumococcal 13-valent conjugate (PCV13) vaccine. One dose is recommended after age 96.  Pneumococcal polysaccharide (PPSV23) vaccine. One dose is recommended after age 20. Talk to your health care provider about which screenings and vaccines you need and how often you need them. This information is not intended to replace advice given to you by your health care provider. Make sure you discuss any questions you have with your health care provider. Document Released: 10/09/2015 Document Revised: 06/01/2016 Document Reviewed: 07/14/2015 Elsevier Interactive Patient Education  2017 [place] Prevention in the Home Falls can cause injuries. They can happen to people of all ages. There are many things you can do to make your home safe and to help prevent falls. What can I do on the outside of my home?  Regularly fix the edges of walkways and driveways and fix any cracks.  Remove anything that might make you trip as you walk through a door, such as a raised step or threshold.  Trim any bushes or trees on the path to your home.  Use bright outdoor lighting.  Clear any walking  paths of anything that might make someone trip, such as rocks or tools.  Regularly check to see if handrails are loose or broken. Make sure that both sides of any steps have handrails.  Any raised decks and porches should have guardrails on the edges.  Have any leaves, snow, or ice cleared regularly.  Use sand or salt on walking paths during winter.  Clean up any spills in your garage right away. This includes oil or grease spills. What can I do in the bathroom?  Use night lights.  Install grab bars by the toilet and in the tub and shower. Do not use towel bars as grab bars.  Use non-skid mats or decals in the tub or shower.  If you need to sit down in the shower, use a plastic, non-slip stool.  Keep the floor dry. Clean up any water that spills on the floor as soon as it happens.  Remove soap buildup in the tub or shower regularly.  Attach bath mats securely with double-sided non-slip rug tape.  Do not have throw rugs and other things on the floor that can make you trip. What can I do in the bedroom?  Use night lights.  Make sure that you have a light by your bed that is easy to reach.  Do not use any sheets or blankets that are too big for your bed. They should not  hang down onto the floor.  Have a firm chair that has side arms. You can use this for support while you get dressed.  Do not have throw rugs and other things on the floor that can make you trip. What can I do in the kitchen?  Clean up any spills right away.  Avoid walking on wet floors.  Keep items that you use a lot in easy-to-reach places.  If you need to reach something above you, use a strong step stool that has a grab bar.  Keep electrical cords out of the way.  Do not use floor polish or wax that makes floors slippery. If you must use wax, use non-skid floor wax.  Do not have throw rugs and other things on the floor that can make you trip. What can I do with my stairs?  Do not leave any items  on the stairs.  Make sure that there are handrails on both sides of the stairs and use them. Fix handrails that are broken or loose. Make sure that handrails are as long as the stairways.  Check any carpeting to make sure that it is firmly attached to the stairs. Fix any carpet that is loose or worn.  Avoid having throw rugs at the top or bottom of the stairs. If you do have throw rugs, attach them to the floor with carpet tape.  Make sure that you have a light switch at the top of the stairs and the bottom of the stairs. If you do not have them, ask someone to add them for you. What else can I do to help prevent falls?  Wear shoes that:  Do not have high heels.  Have rubber bottoms.  Are comfortable and fit you well.  Are closed at the toe. Do not wear sandals.  If you use a stepladder:  Make sure that it is fully opened. Do not climb a closed stepladder.  Make sure that both sides of the stepladder are locked into place.  Ask someone to hold it for you, if possible.  Clearly mark and make sure that you can see:  Any grab bars or handrails.  First and last steps.  Where the edge of each step is.  Use tools that help you move around (mobility aids) if they are needed. These include:  Canes.  Walkers.  Scooters.  Crutches.  Turn on the lights when you go into a dark area. Replace any light bulbs as soon as they burn out.  Set up your furniture so you have a clear path. Avoid moving your furniture around.  If any of your floors are uneven, fix them.  If there are any pets around you, be aware of where they are.  Review your medicines with your doctor. Some medicines can make you feel dizzy. This can increase your chance of falling. Ask your doctor what other things that you can do to help prevent falls. This information is not intended to replace advice given to you by your health care provider. Make sure you discuss any questions you have with your health care  provider. Document Released: 07/09/2009 Document Revised: 02/18/2016 Document Reviewed: 10/17/2014 Elsevier Interactive Patient Education  2017 [employer].

## 2020-04-23 NOTE — Progress Notes (Signed)
Subjective:  Patient ID: Alexandra Scott, female    DOB: 1946-05-16  Age: 74 y.o. MRN: [id number]  CC: No chief complaint on file.   HPI KYOKO ELSEA presents for weak legs x 3-4 months, feeling strange; poor balance   Outpatient Medications Prior to Visit  Medication Sig Dispense Refill  . amLODipine (NORVASC) 5 MG tablet Take 1 tablet (5 mg total) by mouth daily. 90 tablet 3  . aspirin 81 MG EC tablet Take 81 mg by mouth at bedtime.     Marland Kitchen BIOTIN PO Take 1 tablet by mouth at bedtime.    . Calcium Carbonate-Vitamin D3 (CALCIUM 600/VITAMIN D) 600-400 MG-UNIT TABS Take 1 tablet by mouth 2 (two) times daily.     . irbesartan (AVAPRO) 300 MG tablet Take 1 tablet (300 mg total) by mouth daily. 90 tablet 3  . loratadine (CLARITIN) 10 MG tablet Take 1 tablet (10 mg total) by mouth daily. 90 tablet 3  . Magnesium 250 MG TABS Take 250 mg by mouth daily.     . Multiple Vitamins-Minerals (TH COMPLETE MULTI 50+ PO) Take by mouth.    . Omega-3 Fatty Acids (FISH OIL) 1000 MG CAPS Take 1,000 mg by mouth 2 (two) times daily. Reported on 01/11/2016    . Polyethyl Glycol-Propyl Glycol (SYSTANE OP) Place 1 drop into both eyes 2 (two) times daily as needed (dry eyes).    . Turmeric 500 MG TABS Take 500 mg by mouth 2 (two) times daily.     . vitamin C (ASCORBIC ACID) 500 MG tablet Take 500 mg by mouth at bedtime.      No facility-administered medications prior to visit.    ROS: Review of Systems  Constitutional: Negative for activity change, appetite change, chills, fatigue and unexpected weight change.  HENT: Negative for congestion, mouth sores and sinus pressure.   Eyes: Negative for visual disturbance.  Respiratory: Negative for cough and chest tightness.   Gastrointestinal: Negative for abdominal pain and nausea.  Genitourinary: Negative for difficulty urinating, frequency and vaginal pain.  Musculoskeletal: Positive for back pain and gait problem.  Skin: Negative for pallor and rash.    Neurological: Positive for weakness. Negative for dizziness, tremors, numbness and headaches.  Psychiatric/Behavioral: Negative for confusion and sleep disturbance.    Objective:  There were no vitals taken for this visit.  BP Readings from Last 3 Encounters:  04/23/20 (!) 140/80  01/23/20 (!) 160/84  12/12/19 (!) 164/90    Wt Readings from Last 3 Encounters:  04/23/20 160 lb 12.8 oz (72.9 kg)  01/23/20 159 lb (72.1 kg)  12/12/19 157 lb (71.2 kg)    Physical Exam Constitutional:      General: She is not in acute distress.    Appearance: She is well-developed.  HENT:     Head: Normocephalic.     Right Ear: External ear normal.     Left Ear: External ear normal.     Nose: Nose normal.  Eyes:     General:        Right eye: No discharge.        Left eye: No discharge.     Conjunctiva/sclera: Conjunctivae normal.     Pupils: Pupils are equal, round, and reactive to light.  Neck:     Thyroid: No thyromegaly.     Vascular: No JVD.     Trachea: No tracheal deviation.  Cardiovascular:     Rate and Rhythm: Normal rate and regular rhythm.  Heart sounds: Normal heart sounds.  Pulmonary:     Effort: No respiratory distress.     Breath sounds: No stridor. No wheezing.  Abdominal:     General: Bowel sounds are normal. There is no distension.     Palpations: Abdomen is soft. There is no mass.     Tenderness: There is no abdominal tenderness. There is no guarding or rebound.  Musculoskeletal:        General: Tenderness present.     Cervical back: Normal range of motion and neck supple.  Lymphadenopathy:     Cervical: No cervical adenopathy.  Skin:    Findings: No erythema or rash.  Neurological:     Mental Status: She is oriented to person, place, and time.     Cranial Nerves: No cranial nerve deficit.     Motor: No abnormal muscle tone.     Coordination: Coordination abnormal.     Deep Tendon Reflexes: Reflexes normal.  Psychiatric:        Behavior: Behavior normal.         Thought Content: Thought content normal.        Judgment: Judgment normal.    Cane  Lab Results  Component Value Date   WBC 9.3 11/20/2018   HGB 12.8 11/20/2018   HCT 38.1 11/20/2018   PLT 273.0 11/20/2018   GLUCOSE 108 (H) 01/23/2020   CHOL 147 02/07/2018   TRIG 70.0 02/07/2018   HDL 44.30 02/07/2018   LDLDIRECT 150.6 10/15/2007   LDLCALC 89 02/07/2018   ALT 25 10/17/2018   AST 21 10/17/2018   NA 134 (L) 01/23/2020   K 4.0 01/23/2020   CL 97 01/23/2020   CREATININE 0.77 01/23/2020   BUN 14 01/23/2020   CO2 29 01/23/2020   TSH 3.21 01/09/2019   INR 0.97 03/29/2011   HGBA1C 5.7 01/23/2020    MR MRA HEAD WO CONTRAST  Result Date: 11/30/2018 CLINICAL DATA:  75 year old female with ataxia, dizziness, vertigo, right lower extremity weakness. EXAM: MRA HEAD WITHOUT CONTRAST TECHNIQUE: Angiographic images of the Circle of Willis were obtained using MRA technique without intravenous contrast. COMPARISON:  None. FINDINGS: No intracranial mass effect or ventriculomegaly. Acute ischemia is not evaluated with MRA technique. Antegrade flow in the posterior circulation with codominant distal vertebral arteries which are patent to the basilar without stenosis. Patent right PICA and dominant appearing left AICA origins. Patent basilar artery without stenosis. Normal SCA and PCA origins. Posterior communicating arteries are diminutive or absent. Bilateral PCA branches are within normal limits. Antegrade flow in both ICA siphons. The left siphon is mildly dominant owing to dominance of the left ACA A1 segment (normal variant). No siphon stenosis. Normal ophthalmic artery origins. Small infundibulum at the distal left ICA. Patent carotid termini. Normal MCA and ACA origins. Anterior communicating artery and visible ACA branches are within normal limits. Left MCA M1 segment, left MCA bifurcation and visible left MCA branches are within normal limits. Right MCA M1 segment, bifurcation, and visible  right MCA branches are within normal limits. IMPRESSION: Negative intracranial MRA. MRI brain (without contrast would suffice) would be necessary to evaluate for acute ischemia. Electronically Signed   By: Genevie Ann M.D.   On: 11/30/2018 13:04    Assessment & Plan:   There are no diagnoses linked to this encounter.   No orders of the defined types were placed in this encounter.    Follow-up: No follow-ups on file.  Walker Kehr, MD

## 2020-04-23 NOTE — Assessment & Plan Note (Signed)
MRI

## 2020-04-23 NOTE — Assessment & Plan Note (Signed)
Worse B weakness MRI Labs

## 2020-04-23 NOTE — Progress Notes (Addendum)
Subjective:   Alexandra Scott is a 74 y.o. female who presents for Medicare Annual (Subsequent) preventive examination.  Review of Systems    No ROS. Medicare Wellness Visit. Cardiac Risk Factors include: advanced age (>78men, >65 women);dyslipidemia;family history of premature cardiovascular disease;hypertension;obesity (BMI >30kg/m2)     Objective:    Today's Vitals   04/23/20 0811  BP: (!) 140/80  Pulse: 91  Resp: 16  Temp: 98.2 F (36.8 C)  SpO2: 99%  Weight: 160 lb 12.8 oz (72.9 kg)  Height: 5' (1.524 m)  PainSc: 0-No pain   Body mass index is 31.4 kg/m.  Advanced Directives 04/23/2020 10/08/2018 02/12/2018 02/06/2017 06/16/2015 05/21/2014  Does Patient Have a Medical Advance Directive? No No No No No No  Would patient like information on creating a medical advance directive? No - Patient declined Yes (ED - Information included in AVS) No - Patient declined Yes (ED - Information included in AVS) - -    Current Medications (verified) Outpatient Encounter Medications as of 04/23/2020  Medication Sig   amLODipine (NORVASC) 5 MG tablet Take 1 tablet (5 mg total) by mouth daily.   aspirin 81 MG EC tablet Take 81 mg by mouth at bedtime.    BIOTIN PO Take 1 tablet by mouth at bedtime.   Calcium Carbonate-Vitamin D3 (CALCIUM 600/VITAMIN D) 600-400 MG-UNIT TABS Take 1 tablet by mouth 2 (two) times daily.    irbesartan (AVAPRO) 300 MG tablet Take 1 tablet (300 mg total) by mouth daily.   Magnesium 250 MG TABS Take 250 mg by mouth daily.    Multiple Vitamins-Minerals (TH COMPLETE MULTI 50+ PO) Take by mouth.   Omega-3 Fatty Acids (FISH OIL) 1000 MG CAPS Take 1,000 mg by mouth 2 (two) times daily. Reported on 01/11/2016   Polyethyl Glycol-Propyl Glycol (SYSTANE OP) Place 1 drop into both eyes 2 (two) times daily as needed (dry eyes).   Turmeric 500 MG TABS Take 500 mg by mouth 2 (two) times daily.    vitamin C (ASCORBIC ACID) 500 MG tablet Take 500 mg by mouth at bedtime.     loratadine (CLARITIN) 10 MG tablet Take 1 tablet (10 mg total) by mouth daily.   No facility-administered encounter medications on file as of 04/23/2020.    Allergies (verified) Augmentin [amoxicillin-pot clavulanate], Cortisone, and Prednisone   History: Past Medical History:  Diagnosis Date   Breast cyst 2008   Left / SER   Colon polyps    Constipation    GERD (gastroesophageal reflux disease)    uses OTC   HTN (hypertension)    LBP (low back pain)    ? Spinal stenosis   Osteoarthritis    Osteoarthritis of hip 2011   Right/Dr Fields   Past Surgical History:  Procedure Laterality Date   BREAST DUCTAL SYSTEM EXCISION Left 05/22/2014   Procedure: LEFT NIPPLE DUCT EXCISION;  Surgeon: Imogene Burn. Georgette Dover, MD;  Location: [facility];  Service: General;  Laterality: Left;   COLONOSCOPY W/ BIOPSIES AND POLYPECTOMY     JOINT REPLACEMENT  7/12   R THR   Family History  Problem Relation Age of Onset   Colon cancer Maternal Uncle    Hypertension Other    Breast cancer Other    Social History   Socioeconomic History   Marital status: Married    Spouse name: Not on file   Number of children: Not on file   Years of education: Not on file   Highest education level: Not on  file  Occupational History   Occupation: Museum/gallery conservator: self employed  Tobacco Use   Smoking status: Never Smoker   Smokeless tobacco: Never Used  Scientific laboratory technician Use: Never used  Substance and Sexual Activity   Alcohol use: No    Alcohol/week: 0.0 standard drinks   Drug use: No   Sexual activity: Not Currently  Other Topics Concern   Not on file  Social History Narrative   Not on file   Social Determinants of Health   Financial Resource Strain: Low Risk    Difficulty of Paying Living Expenses: Not hard at all  Food Insecurity: No Food Insecurity   Worried About Charity fundraiser in the Last Year: Never true   [place] in the Last Year: Never true  Transportation Needs: No  Transportation Needs   Lack of Transportation (Medical): No   Lack of Transportation (Non-Medical): No  Physical Activity: Inactive   Days of Exercise per Week: 0 days   Minutes of Exercise per Session: 0 min  Stress: No Stress Concern Present   Feeling of Stress : Not at all  Social Connections: Socially Integrated   Frequency of Communication with Friends and Family: More than three times a week   Frequency of Social Gatherings with Friends and Family: More than three times a week   Attends Religious Services: More than 4 times per year   Active Member of [employer] or Organizations: Yes   Attends Music therapist: More than 4 times per year   Marital Status: Married    Tobacco Counseling Counseling given: No   Clinical Intake:  Pre-visit preparation completed: Yes  Pain : No/denies pain Pain Score: 0-No pain     BMI - recorded: 31.4 Nutritional Status: BMI > 30  Obese Nutritional Risks: None Diabetes: No  How often do you need to have someone help you when you read instructions, pamphlets, or other written materials from your doctor or pharmacy?: 1 - Never What is the last grade level you completed in school?: HSG  Diabetic? no  Interpreter Needed?: No  Information entered by :: Di Jasmer N. Makhari Dovidio, LPN   Activities of Daily Living In your present state of health, do you have any difficulty performing the following activities: 04/23/2020  Hearing? N  Vision? N  Difficulty concentrating or making decisions? N  Walking or climbing stairs? N  Dressing or bathing? N  Doing errands, shopping? N  Preparing Food and eating ? N  Using the Toilet? N  In the past six months, have you accidently leaked urine? N  Do you have problems with loss of bowel control? N  Managing your Medications? N  Managing your Finances? N  Housekeeping or managing your Housekeeping? N  Some recent data might be hidden    Patient Care Team: Plotnikov, Evie Lacks, MD as PCP -  General Barbaraann Rondo, MD (Obstetrics and Gynecology) Ladene Artist, MD (Gastroenterology) Gaynelle Arabian, MD (Orthopedic Surgery)  Indicate any recent Medical Services you may have received from other than [facility] providers in the past year (date may be approximate).     Assessment:   This is a routine wellness examination for Jaqueline.  Hearing/Vision screen No exam data present  Dietary issues and exercise activities discussed: Current Exercise Habits: The patient does not participate in regular exercise at present, Exercise limited by: orthopedic condition(s);Other - see comments (low back pain)  Goals  Be as healthy as possible     Start to think more about what I am eating and about how to increase my activity. I will start to try to drink at least a quart of water per day.      Patient Stated     I will continue to keep water at my finger tips at all times and set certain times to stop and drink a glass of water. I want to do my best to take my vitamins everyday.       Depression Screen PHQ 2/9 Scores 04/23/2020 08/14/2019 02/12/2018 02/06/2017 06/30/2015  PHQ - 2 Score 0 1 1 0 0  PHQ- 9 Score - - 2 - -    Fall Risk Fall Risk  04/23/2020 05/22/2019 02/12/2018 02/06/2017 06/30/2015  Falls in the past year? 0 1 Yes No No  Number falls in past yr: 0 1 1 - -  Injury with Fall? 0 1 No - -  Risk for fall due to : Impaired balance/gait Orthopedic patient - - -  Follow up Falls evaluation completed - Falls prevention discussed - -    Any stairs in or around the home? No  If so, are there any without handrails? No  Home free of loose throw rugs in walkways, pet beds, electrical cords, etc? Yes  Adequate lighting in your home to reduce risk of falls? Yes   ASSISTIVE DEVICES UTILIZED TO PREVENT FALLS:  Life alert? No  Use of a cane, walker or w/c? Yes  Grab bars in the bathroom? No  Shower chair or bench in shower? Yes  Elevated toilet seat or a handicapped toilet? No    TIMED UP AND GO:  Was the test performed? No .  Length of time to ambulate 10 feet: 0 sec.   Gait steady and fast with assistive device  Cognitive Function:     6CIT Screen 04/23/2020  What Year? 0 points  What month? 0 points  What time? 0 points  Count back from 20 0 points  Months in reverse 0 points  Repeat phrase 0 points  Total Score 0    Immunizations Immunization History  Administered Date(s) Administered   Fluad Quad(high Dose 65+) 05/22/2019   Influenza,inj,Quad PF,6+ Mos 06/30/2014, 06/30/2015   Influenza-Unspecified 06/27/2015   Pneumococcal Conjugate-13 01/11/2016   Pneumococcal Polysaccharide-23 02/06/2018   Td 11/19/2010    TDAP status: Up to date Flu Vaccine status: Up to date Pneumococcal vaccine status: Up to date Covid-19 vaccine status: Declined, Education has been provided regarding the importance of this vaccine but patient still declined. Advised may receive this vaccine at local pharmacy or Health Dept.or vaccine clinic. Aware to provide a copy of the vaccination record if obtained from local pharmacy or Health Dept. Verbalized acceptance and understanding.  Qualifies for Shingles Vaccine? Yes   Zostavax completed No   Shingrix Completed?: No.    Education has been provided regarding the importance of this vaccine. Patient has been advised to call insurance company to determine out of pocket expense if they have not yet received this vaccine. Advised may also receive vaccine at local pharmacy or Health Dept. Verbalized acceptance and understanding.  Screening Tests Health Maintenance  Topic Date Due   Hepatitis C Screening  Never done   COVID-19 Vaccine (1) Never done   INFLUENZA VACCINE  04/26/2020   COLONOSCOPY  07/07/2020   MAMMOGRAM  11/12/2020   TETANUS/TDAP  11/19/2020   DEXA SCAN  Completed   PNA vac Low  Risk Adult  Completed    Health Maintenance  Health Maintenance Due  Topic Date Due   Hepatitis C Screening  Never done    COVID-19 Vaccine (1) Never done    Colorectal cancer screening: Completed 07/08/2015. Repeat every 5 years Mammogram status: Completed 11/18/2019. Repeat every year Bone Density status: Completed 01/16/2017. Results reflect: Bone density results: NORMAL. Repeat every 2-3 years.  Lung Cancer Screening: (Low Dose CT Chest recommended if Age 57-80 years, 30 pack-year currently smoking OR have quit w/in 15years.) does not qualify.   Lung Cancer Screening Referral: no  Additional Screening:  Hepatitis C Screening: does qualify; Completed no  Vision Screening: Recommended annual ophthalmology exams for early detection of glaucoma and other disorders of the eye. Is the patient up to date with their annual eye exam?  Yes  Who is the provider or what is the name of the office in which the patient attends annual eye exams? Clent Jacks, MD If pt is not established with a provider, would they like to be referred to a provider to establish care? No .   Dental Screening: Recommended annual dental exams for proper oral hygiene  Community Resource Referral / Chronic Care Management: CRR required this visit?  No   CCM required this visit?  No      Plan:     I have personally reviewed and noted the following in the patient's chart:   Medical and social history Use of alcohol, tobacco or illicit drugs  Current medications and supplements Functional ability and status Nutritional status Physical activity Advanced directives List of other physicians Hospitalizations, surgeries, and ER visits in previous 12 months Vitals Screenings to include cognitive, depression, and falls Referrals and appointments  In addition, I have reviewed and discussed with patient certain preventive protocols, quality metrics, and best practice recommendations. A written personalized care plan for preventive services as well as general preventive health recommendations were provided to patient.     Sheral Flow, LPN   03/23/6380   Nurse Notes: n/a   Medical screening examination/treatment/procedure(s) were performed by non-physician practitioner and as supervising physician I was immediately available for consultation/collaboration.  I agree with above. Lew Dawes, MD

## 2020-04-24 LAB — URINALYSIS
Bilirubin Urine: NEGATIVE
Glucose, UA: NEGATIVE
Hgb urine dipstick: NEGATIVE
Ketones, ur: NEGATIVE
Nitrite: NEGATIVE
Protein, ur: NEGATIVE
Specific Gravity, Urine: 1.009 (ref 1.001–1.03)
pH: 7.5 (ref 5.0–8.0)

## 2020-04-24 LAB — CBC WITH DIFFERENTIAL/PLATELET
Absolute Monocytes: 727 cells/uL (ref 200–950)
Basophils Absolute: 43 cells/uL (ref 0–200)
Basophils Relative: 0.6 %
Eosinophils Absolute: 216 cells/uL (ref 15–500)
Eosinophils Relative: 3 %
HCT: 37.4 % (ref 35.0–45.0)
Hemoglobin: 12.5 g/dL (ref 11.7–15.5)
Lymphs Abs: 1692 cells/uL (ref 850–3900)
MCH: 29.9 pg (ref 27.0–33.0)
MCHC: 33.4 g/dL (ref 32.0–36.0)
MCV: 89.5 fL (ref 80.0–100.0)
MPV: 9.9 fL (ref 7.5–12.5)
Monocytes Relative: 10.1 %
Neutro Abs: 4522 cells/uL (ref 1500–7800)
Neutrophils Relative %: 62.8 %
Platelets: 282 10*3/uL (ref 140–400)
RBC: 4.18 10*6/uL (ref 3.80–5.10)
RDW: 12.2 % (ref 11.0–15.0)
Total Lymphocyte: 23.5 %
WBC: 7.2 10*3/uL (ref 3.8–10.8)

## 2020-04-24 LAB — HEMOGLOBIN A1C
Hgb A1c MFr Bld: 5.4 % of total Hgb (ref <5.7)
Mean Plasma Glucose: 108 (calc)
eAG (mmol/L): 6 (calc)

## 2020-04-24 LAB — VITAMIN D 25 HYDROXY (VIT D DEFICIENCY, FRACTURES): Vit D, 25-Hydroxy: 30 ng/mL (ref 30–100)

## 2020-04-24 LAB — TSH: TSH: 3.05 mIU/L (ref 0.40–4.50)

## 2020-04-24 LAB — VITAMIN B12: Vitamin B-12: 814 pg/mL (ref 200–1100)

## 2020-04-26 ENCOUNTER — Other Ambulatory Visit: Payer: Self-pay | Admitting: Internal Medicine

## 2020-04-26 MED ORDER — VITAMIN D3 50 MCG (2000 UT) PO CAPS
2000.0000 [IU] | ORAL_CAPSULE | Freq: Every day | ORAL | 3 refills | Status: AC
Start: 2020-04-26 — End: ?

## 2020-04-28 ENCOUNTER — Other Ambulatory Visit: Payer: Self-pay

## 2020-04-28 ENCOUNTER — Ambulatory Visit
Admission: RE | Admit: 2020-04-28 | Discharge: 2020-04-28 | Disposition: A | Discharge status: Home / Self Care | Payer: PPO | Source: Ambulatory Visit | Attending: Internal Medicine | Admitting: Internal Medicine

## 2020-04-28 DIAGNOSIS — M48061 Spinal stenosis, lumbar region without neurogenic claudication: Secondary | ICD-10-CM | POA: Diagnosis not present

## 2020-04-28 DIAGNOSIS — M5126 Other intervertebral disc displacement, lumbar region: Secondary | ICD-10-CM | POA: Diagnosis not present

## 2020-04-28 DIAGNOSIS — R29898 Other symptoms and signs involving the musculoskeletal system: Secondary | ICD-10-CM

## 2020-04-28 DIAGNOSIS — M4807 Spinal stenosis, lumbosacral region: Secondary | ICD-10-CM | POA: Diagnosis not present

## 2020-04-28 DIAGNOSIS — M47816 Spondylosis without myelopathy or radiculopathy, lumbar region: Secondary | ICD-10-CM | POA: Diagnosis not present

## 2020-04-28 MED ORDER — GADOBENATE DIMEGLUMINE 529 MG/ML IV SOLN
15.0000 mL | Freq: Once | INTRAVENOUS | Status: AC | PRN
  Administered 2020-04-28: 15 mL via INTRAVENOUS

## 2020-04-29 ENCOUNTER — Other Ambulatory Visit: Payer: Self-pay | Admitting: Internal Medicine

## 2020-04-29 DIAGNOSIS — M4807 Spinal stenosis, lumbosacral region: Secondary | ICD-10-CM

## 2020-05-21 ENCOUNTER — Encounter: Payer: Self-pay | Admitting: Internal Medicine

## 2020-05-21 ENCOUNTER — Ambulatory Visit (INDEPENDENT_AMBULATORY_CARE_PROVIDER_SITE_OTHER): Payer: PPO | Admitting: Internal Medicine

## 2020-05-21 ENCOUNTER — Other Ambulatory Visit: Payer: Self-pay

## 2020-05-21 ENCOUNTER — Ambulatory Visit (INDEPENDENT_AMBULATORY_CARE_PROVIDER_SITE_OTHER): Payer: PPO

## 2020-05-21 DIAGNOSIS — R0609 Other forms of dyspnea: Secondary | ICD-10-CM | POA: Insufficient documentation

## 2020-05-21 DIAGNOSIS — F419 Anxiety disorder, unspecified: Secondary | ICD-10-CM

## 2020-05-21 DIAGNOSIS — R600 Localized edema: Secondary | ICD-10-CM | POA: Diagnosis not present

## 2020-05-21 DIAGNOSIS — R0600 Dyspnea, unspecified: Secondary | ICD-10-CM

## 2020-05-21 DIAGNOSIS — R531 Weakness: Secondary | ICD-10-CM

## 2020-05-21 DIAGNOSIS — I10 Essential (primary) hypertension: Secondary | ICD-10-CM | POA: Diagnosis not present

## 2020-05-21 MED ORDER — FUROSEMIDE 20 MG PO TABS: 10.0000 mg | ORAL_TABLET | Freq: Every day | ORAL | 3 refills | Status: DC

## 2020-05-21 NOTE — Progress Notes (Signed)
Subjective:  Patient ID: Alexandra Scott, female    DOB: 1946/04/23  Age: 74 y.o. MRN: [id number]  CC: No chief complaint on file.   HPI Alexandra Scott presents for leg weakness - not better, spinal stenosis  C/o fatigue, SOB/DOE - not new F/u HTN  Pt refused to have COVID shots  Outpatient Medications Prior to Visit  Medication Sig Dispense Refill  . amLODipine (NORVASC) 5 MG tablet Take 1 tablet (5 mg total) by mouth daily. 90 tablet 3  . aspirin 81 MG EC tablet Take 81 mg by mouth at bedtime.     Marland Kitchen BIOTIN PO Take 1 tablet by mouth at bedtime.    . Calcium Carbonate-Vitamin D3 (CALCIUM 600/VITAMIN D) 600-400 MG-UNIT TABS Take 1 tablet by mouth 2 (two) times daily.     . Cholecalciferol (VITAMIN D3) 50 MCG (2000 UT) capsule Take 1 capsule (2,000 Units total) by mouth daily. 100 capsule 3  . irbesartan (AVAPRO) 300 MG tablet Take 1 tablet (300 mg total) by mouth daily. 90 tablet 3  . loratadine (CLARITIN) 10 MG tablet Take 1 tablet (10 mg total) by mouth daily. 90 tablet 3  . Magnesium 250 MG TABS Take 250 mg by mouth daily.     . Multiple Vitamins-Minerals (TH COMPLETE MULTI 50+ PO) Take by mouth.    . Omega-3 Fatty Acids (FISH OIL) 1000 MG CAPS Take 1,000 mg by mouth 2 (two) times daily. Reported on 01/11/2016    . Polyethyl Glycol-Propyl Glycol (SYSTANE OP) Place 1 drop into both eyes 2 (two) times daily as needed (dry eyes).    . Turmeric 500 MG TABS Take 500 mg by mouth 2 (two) times daily.     . vitamin C (ASCORBIC ACID) 500 MG tablet Take 500 mg by mouth at bedtime.      No facility-administered medications prior to visit.    ROS: Review of Systems  Constitutional: Positive for fatigue. Negative for activity change, appetite change, chills and unexpected weight change.  HENT: Negative for congestion, mouth sores and sinus pressure.   Eyes: Negative for visual disturbance.  Respiratory: Positive for shortness of breath. Negative for cough, chest tightness and  wheezing.   Gastrointestinal: Negative for abdominal pain and nausea.  Genitourinary: Negative for difficulty urinating, frequency and vaginal pain.  Musculoskeletal: Negative for back pain and gait problem.  Skin: Negative for pallor and rash.  Neurological: Positive for weakness. Negative for dizziness, tremors, numbness and headaches.  Psychiatric/Behavioral: Negative for confusion and sleep disturbance.    Objective:  BP (!) 150/74 (BP Location: Right Arm, Patient Position: Sitting, Cuff Size: Large)   Pulse 93   Temp 98.2 F (36.8 C) (Oral)   Ht 5' (1.524 m)   Wt 161 lb (73 kg)   SpO2 98%   BMI 31.44 kg/m   BP Readings from Last 3 Encounters:  05/21/20 (!) 150/74  04/23/20 (!) 140/80  01/23/20 (!) 160/84    Wt Readings from Last 3 Encounters:  05/21/20 161 lb (73 kg)  04/23/20 160 lb 12.8 oz (72.9 kg)  01/23/20 159 lb (72.1 kg)    Physical Exam Constitutional:      General: She is not in acute distress.    Appearance: She is well-developed. She is obese.  HENT:     Head: Normocephalic.     Right Ear: External ear normal.     Left Ear: External ear normal.     Nose: Nose normal.  Eyes:  General:        Right eye: No discharge.        Left eye: No discharge.     Conjunctiva/sclera: Conjunctivae normal.     Pupils: Pupils are equal, round, and reactive to light.  Neck:     Thyroid: No thyromegaly.     Vascular: No JVD.     Trachea: No tracheal deviation.  Cardiovascular:     Rate and Rhythm: Normal rate and regular rhythm.     Heart sounds: Normal heart sounds.  Pulmonary:     Effort: No respiratory distress.     Breath sounds: No stridor. No wheezing.  Abdominal:     General: Bowel sounds are normal. There is no distension.     Palpations: Abdomen is soft. There is no mass.     Tenderness: There is no abdominal tenderness. There is no guarding or rebound.  Musculoskeletal:        General: No tenderness.     Cervical back: Normal range of motion and  neck supple.     Left lower leg: No edema.  Lymphadenopathy:     Cervical: No cervical adenopathy.  Skin:    Findings: No erythema or rash.  Neurological:     Cranial Nerves: No cranial nerve deficit.     Motor: No abnormal muscle tone.     Coordination: Coordination normal.     Deep Tendon Reflexes: Reflexes normal.  Psychiatric:        Behavior: Behavior normal.        Thought Content: Thought content normal.        Judgment: Judgment normal.     Lab Results  Component Value Date   WBC 7.2 04/23/2020   HGB 12.5 04/23/2020   HCT 37.4 04/23/2020   PLT 282 04/23/2020   GLUCOSE 111 (H) 04/23/2020   CHOL 147 02/07/2018   TRIG 70.0 02/07/2018   HDL 44.30 02/07/2018   LDLDIRECT 150.6 10/15/2007   LDLCALC 89 02/07/2018   ALT 26 04/23/2020   AST 25 04/23/2020   NA 136 04/23/2020   K 4.5 04/23/2020   CL 99 04/23/2020   CREATININE 0.75 04/23/2020   BUN 15 04/23/2020   CO2 27 04/23/2020   TSH 3.05 04/23/2020   INR 0.97 03/29/2011   HGBA1C 5.4 04/23/2020    MR Lumbar Spine W Wo Contrast  Result Date: 04/28/2020 CLINICAL DATA:  Low back pain with progressive neurologic deficit. Bilateral leg weakness. EXAM: MRI LUMBAR SPINE WITHOUT AND WITH CONTRAST TECHNIQUE: Multiplanar and multiecho pulse sequences of the lumbar spine were obtained without and with intravenous contrast. CONTRAST:  11mL MULTIHANCE GADOBENATE DIMEGLUMINE 529 MG/ML IV SOLN COMPARISON:  Lumbar radiographs 11/20/2018 FINDINGS: Segmentation:  Normal Alignment:  Moderate dextroscoliosis Mild retrolisthesis L1-2 and L2-3. Vertebrae: Negative for fracture or mass. Asymmetric disc degeneration with discogenic edema in the bone marrow at L2-3 on the left related to scoliosis. Conus medullaris and cauda equina: Conus extends to the L2 level. Conus and cauda equina appear normal. Paraspinal and other soft tissues: Negative for paraspinous mass adenopathy or fluid collection. Disc levels: L1-2: Central and left-sided disc  protrusion. Mild facet degeneration. Mild subarticular stenosis on the left L2-3: Asymmetric disc degeneration on the left with disc space narrowing and spurring left greater than right. Bilateral facet hypertrophy left greater than right. Moderate to severe subarticular stenosis on the left. Final canal adequate in size. L3-4: Asymmetric disc degeneration on the left with disc space narrowing and spurring. Bilateral moderate facet hypertrophy  left greater than right. Moderate to severe subarticular stenosis on the left. Mild left foraminal narrowing. Mild to moderate spinal stenosis. Mild subarticular stenosis on the right L4-5: Asymmetric disc degeneration on the left with disc space narrowing and spurring. Moderate to advanced facet hypertrophy. Severe spinal stenosis and severe subarticular stenosis bilaterally left greater than right L5-S1: Disc degeneration with disc space narrowing and spurring asymmetric on the right. Moderate facet hypertrophy bilaterally. Moderate subarticular and foraminal stenosis on the right due to spurring. Mild spinal stenosis. IMPRESSION: Moderate dextroscoliosis in the lumbar spine. Multilevel disc and facet degeneration throughout the lumbar spine as described above Moderate to severe subarticular stenosis on the left L2-3 and L3-4. Severe spinal stenosis L4-5 with severe subarticular stenosis bilaterally Moderate subarticular stenosis and foraminal stenosis on the right at L5-S1. Electronically Signed   By: Franchot Gallo M.D.   On: 04/28/2020 10:44    Assessment & Plan:    Walker Kehr, MD

## 2020-05-21 NOTE — Assessment & Plan Note (Signed)
Due to spinal stenosis NS appt is pending

## 2020-05-21 NOTE — Assessment & Plan Note (Signed)
Added a low dose Lasix

## 2020-05-21 NOTE — Assessment & Plan Note (Addendum)
Likely deconditioning related CXR CL stress test (can't walk in a treadmill) - pt declined Pt refused to have COVID shots

## 2020-05-21 NOTE — Assessment & Plan Note (Signed)
Xanax prn rare use  Potential benefits of a long term opioids use as well as potential risks (i.e. addiction risk, apnea etc) and complications (i.e. Somnolence, constipation and others) were explained to the patient and were aknowledged. 

## 2020-05-21 NOTE — Assessment & Plan Note (Signed)
Not well controlled Added a low dose Lasix

## 2020-06-02 DIAGNOSIS — Z6831 Body mass index (BMI) 31.0-31.9, adult: Secondary | ICD-10-CM | POA: Diagnosis not present

## 2020-06-02 DIAGNOSIS — Z01419 Encounter for gynecological examination (general) (routine) without abnormal findings: Secondary | ICD-10-CM | POA: Diagnosis not present

## 2020-06-15 DIAGNOSIS — M48061 Spinal stenosis, lumbar region without neurogenic claudication: Secondary | ICD-10-CM | POA: Diagnosis not present

## 2020-06-15 DIAGNOSIS — M4316 Spondylolisthesis, lumbar region: Secondary | ICD-10-CM | POA: Diagnosis not present

## 2020-06-15 DIAGNOSIS — M48062 Spinal stenosis, lumbar region with neurogenic claudication: Secondary | ICD-10-CM | POA: Diagnosis not present

## 2020-06-15 DIAGNOSIS — M4120 Other idiopathic scoliosis, site unspecified: Secondary | ICD-10-CM | POA: Diagnosis not present

## 2020-06-15 DIAGNOSIS — M4180 Other forms of scoliosis, site unspecified: Secondary | ICD-10-CM | POA: Diagnosis not present

## 2020-06-16 DIAGNOSIS — H11153 Pinguecula, bilateral: Secondary | ICD-10-CM | POA: Diagnosis not present

## 2020-06-16 DIAGNOSIS — H25813 Combined forms of age-related cataract, bilateral: Secondary | ICD-10-CM | POA: Diagnosis not present

## 2020-06-16 DIAGNOSIS — H02831 Dermatochalasis of right upper eyelid: Secondary | ICD-10-CM | POA: Diagnosis not present

## 2020-06-16 DIAGNOSIS — H5703 Miosis: Secondary | ICD-10-CM | POA: Diagnosis not present

## 2020-06-16 DIAGNOSIS — H04123 Dry eye syndrome of bilateral lacrimal glands: Secondary | ICD-10-CM | POA: Diagnosis not present

## 2020-06-16 DIAGNOSIS — H0102A Squamous blepharitis right eye, upper and lower eyelids: Secondary | ICD-10-CM | POA: Diagnosis not present

## 2020-06-16 DIAGNOSIS — H02834 Dermatochalasis of left upper eyelid: Secondary | ICD-10-CM | POA: Diagnosis not present

## 2020-06-16 DIAGNOSIS — H0102B Squamous blepharitis left eye, upper and lower eyelids: Secondary | ICD-10-CM | POA: Diagnosis not present

## 2020-06-19 ENCOUNTER — Other Ambulatory Visit: Payer: Self-pay | Admitting: Neurosurgery

## 2020-06-19 DIAGNOSIS — M8580 Other specified disorders of bone density and structure, unspecified site: Secondary | ICD-10-CM

## 2020-06-30 ENCOUNTER — Other Ambulatory Visit: Payer: Self-pay

## 2020-06-30 ENCOUNTER — Ambulatory Visit (INDEPENDENT_AMBULATORY_CARE_PROVIDER_SITE_OTHER)
Admission: RE | Admit: 2020-06-30 | Discharge: 2020-06-30 | Disposition: A | Discharge status: Home / Self Care | Payer: PPO | Source: Ambulatory Visit | Attending: Internal Medicine | Admitting: Internal Medicine

## 2020-06-30 DIAGNOSIS — M8580 Other specified disorders of bone density and structure, unspecified site: Secondary | ICD-10-CM

## 2020-07-09 DIAGNOSIS — M5416 Radiculopathy, lumbar region: Secondary | ICD-10-CM | POA: Diagnosis not present

## 2020-07-09 DIAGNOSIS — M545 Low back pain, unspecified: Secondary | ICD-10-CM | POA: Diagnosis not present

## 2020-07-14 DIAGNOSIS — M5416 Radiculopathy, lumbar region: Secondary | ICD-10-CM | POA: Diagnosis not present

## 2020-07-14 DIAGNOSIS — M545 Low back pain, unspecified: Secondary | ICD-10-CM | POA: Diagnosis not present

## 2020-07-17 DIAGNOSIS — M545 Low back pain, unspecified: Secondary | ICD-10-CM | POA: Diagnosis not present

## 2020-07-17 DIAGNOSIS — M5416 Radiculopathy, lumbar region: Secondary | ICD-10-CM | POA: Diagnosis not present

## 2020-07-21 DIAGNOSIS — M545 Low back pain, unspecified: Secondary | ICD-10-CM | POA: Diagnosis not present

## 2020-07-21 DIAGNOSIS — M5416 Radiculopathy, lumbar region: Secondary | ICD-10-CM | POA: Diagnosis not present

## 2020-07-24 DIAGNOSIS — M5416 Radiculopathy, lumbar region: Secondary | ICD-10-CM | POA: Diagnosis not present

## 2020-07-24 DIAGNOSIS — M545 Low back pain, unspecified: Secondary | ICD-10-CM | POA: Diagnosis not present

## 2020-07-27 DIAGNOSIS — M545 Low back pain, unspecified: Secondary | ICD-10-CM | POA: Diagnosis not present

## 2020-07-27 DIAGNOSIS — M5416 Radiculopathy, lumbar region: Secondary | ICD-10-CM | POA: Diagnosis not present

## 2020-07-30 DIAGNOSIS — M5416 Radiculopathy, lumbar region: Secondary | ICD-10-CM | POA: Diagnosis not present

## 2020-07-30 DIAGNOSIS — M545 Low back pain, unspecified: Secondary | ICD-10-CM | POA: Diagnosis not present

## 2020-08-03 DIAGNOSIS — M545 Low back pain, unspecified: Secondary | ICD-10-CM | POA: Diagnosis not present

## 2020-08-03 DIAGNOSIS — M5416 Radiculopathy, lumbar region: Secondary | ICD-10-CM | POA: Diagnosis not present

## 2020-08-05 DIAGNOSIS — M545 Low back pain, unspecified: Secondary | ICD-10-CM | POA: Diagnosis not present

## 2020-08-05 DIAGNOSIS — M5416 Radiculopathy, lumbar region: Secondary | ICD-10-CM | POA: Diagnosis not present

## 2020-08-10 DIAGNOSIS — M5416 Radiculopathy, lumbar region: Secondary | ICD-10-CM | POA: Diagnosis not present

## 2020-08-10 DIAGNOSIS — M545 Low back pain, unspecified: Secondary | ICD-10-CM | POA: Diagnosis not present

## 2020-08-13 DIAGNOSIS — M545 Low back pain, unspecified: Secondary | ICD-10-CM | POA: Diagnosis not present

## 2020-08-13 DIAGNOSIS — M5416 Radiculopathy, lumbar region: Secondary | ICD-10-CM | POA: Diagnosis not present

## 2020-08-14 ENCOUNTER — Encounter: Payer: Self-pay | Admitting: Family

## 2020-08-14 ENCOUNTER — Other Ambulatory Visit: Payer: Self-pay

## 2020-08-14 ENCOUNTER — Ambulatory Visit (INDEPENDENT_AMBULATORY_CARE_PROVIDER_SITE_OTHER): Payer: PPO | Admitting: Family

## 2020-08-14 VITALS — BP 140/76 | HR 107 | Temp 98.1°F | Ht 60.0 in | Wt 156.0 lb

## 2020-08-14 DIAGNOSIS — Z23 Encounter for immunization: Secondary | ICD-10-CM

## 2020-08-14 DIAGNOSIS — L600 Ingrowing nail: Secondary | ICD-10-CM

## 2020-08-14 MED ORDER — DOXYCYCLINE HYCLATE 100 MG PO TABS
100.0000 mg | ORAL_TABLET | Freq: Two times a day (BID) | ORAL | 0 refills | Status: DC
Start: 2020-08-14 — End: 2020-11-23

## 2020-08-14 NOTE — Progress Notes (Signed)
Alexandra Scott is a 74 y.o. female with the following history as recorded in [employer]:  Patient Active Problem List   Diagnosis Date Noted  . DOE (dyspnea on exertion) 05/21/2020  . Paresthesia 01/23/2020  . Nose dryness 12/12/2019  . Anxiety disorder 08/14/2019  . Wart viral 05/22/2019  . Rash and nonspecific skin eruption 04/10/2019  . Edema 04/10/2019  . Alopecia 01/09/2019  . Hair loss 12/06/2018  . Dry mouth 11/27/2018  . Ataxia 11/20/2018  . Dizzinesses 11/20/2018  . Leg weakness, bilateral 11/20/2018  . Shingles 10/17/2018  . Hyponatremia 10/08/2018  . Influenza B 10/08/2018  . Callus of foot 03/07/2017  . Leg pain, lateral, right 02/06/2017  . Stress at home 02/06/2017  . Abdominal pain in female 01/10/2017  . Osteoporosis, post-menopausal 01/10/2017  . Sore in nose 01/10/2017  . GERD (gastroesophageal reflux disease) 01/10/2017  . Cystitis 01/13/2016  . Cough 01/11/2016  . Acute upper respiratory infection 09/25/2015  . Lightheadedness 06/30/2015  . Discharge from left nipple 05/06/2014  . Right shoulder pain 11/22/2013  . Weakness 11/22/2013  . Cellulitis and abscess of buttock 01/31/2013  . Actinic keratoses 07/01/2012  . Well adult exam 05/18/2012  . Anemia 05/18/2012  . Neoplasm of uncertain behavior of skin 05/18/2012  . Osteoarthritis 08/18/2010  . DEGENERATIVE JOINT DISEASE, RIGHT HIP 07/01/2010  . HIP PAIN 04/21/2010  . LOW BACK PAIN 02/05/2010  . CONSTIPATION 10/24/2008  . RLQ PAIN 10/24/2008  . Essential hypertension 10/24/2007  . BREAST CYST 10/24/2007  . FOOT PAIN 10/24/2007  . COLONIC POLYPS, HX OF 10/24/2007    Current Outpatient Medications  Medication Sig Dispense Refill  . amLODipine (NORVASC) 5 MG tablet Take 1 tablet (5 mg total) by mouth daily. 90 tablet 3  . aspirin 81 MG EC tablet Take 81 mg by mouth at bedtime.     Marland Kitchen BIOTIN PO Take 1 tablet by mouth at bedtime.    . Calcium Carbonate-Vitamin D3 (CALCIUM 600/VITAMIN D) 600-400  MG-UNIT TABS Take 1 tablet by mouth 2 (two) times daily.     . Cholecalciferol (VITAMIN D3) 50 MCG (2000 UT) capsule Take 1 capsule (2,000 Units total) by mouth daily. 100 capsule 3  . irbesartan (AVAPRO) 300 MG tablet Take 1 tablet (300 mg total) by mouth daily. 90 tablet 3  . loratadine (CLARITIN) 10 MG tablet Take 1 tablet (10 mg total) by mouth daily. 90 tablet 3  . Magnesium 250 MG TABS Take 250 mg by mouth daily.     . Multiple Vitamins-Minerals (TH COMPLETE MULTI 50+ PO) Take by mouth.    . Omega-3 Fatty Acids (FISH OIL) 1000 MG CAPS Take 1,000 mg by mouth 2 (two) times daily. Reported on 01/11/2016    . Polyethyl Glycol-Propyl Glycol (SYSTANE OP) Place 1 drop into both eyes 2 (two) times daily as needed (dry eyes).    . Turmeric 500 MG TABS Take 500 mg by mouth 2 (two) times daily.     . vitamin C (ASCORBIC ACID) 500 MG tablet Take 500 mg by mouth at bedtime.     Marland Kitchen doxycycline (VIBRA-TABS) 100 MG tablet Take 1 tablet (100 mg total) by mouth 2 (two) times daily. 14 tablet 0  . furosemide (LASIX) 20 MG tablet Take 0.5-1 tablets (10-20 mg total) by mouth daily. (Patient not taking: Reported on 08/14/2020) 90 tablet 3   No current facility-administered medications for this visit.    Allergies: Augmentin [amoxicillin-pot clavulanate], Maxzide [hydrochlorothiazide w-triamterene], Cortisone, and Prednisone  Past Medical  History:  Diagnosis Date  . Breast cyst 2008   Left / SER  . Colon polyps   . Constipation   . GERD (gastroesophageal reflux disease)    uses OTC  . HTN (hypertension)   . LBP (low back pain)    ? Spinal stenosis  . Osteoarthritis   . Osteoarthritis of hip 2011   Right/Dr Fields    Past Surgical History:  Procedure Laterality Date  . BREAST DUCTAL SYSTEM EXCISION Left 05/22/2014   Procedure: LEFT NIPPLE DUCT EXCISION;  Surgeon: Imogene Burn. Georgette Dover, MD;  Location: [facility];  Service: General;  Laterality: Left;  . COLONOSCOPY W/ BIOPSIES AND POLYPECTOMY    . JOINT  REPLACEMENT  7/12   R THR    Family History  Problem Relation Age of Onset  . Colon cancer Maternal Uncle   . Hypertension Other   . Breast cancer Other     Social History   Tobacco Use  . Smoking status: Never Smoker  . Smokeless tobacco: Never Used  Substance Use Topics  . Alcohol use: No    Alcohol/week: 0.0 standard drinks    Subjective:  Bilateral toe pain; notes had pedicure earlier this week and her right 1st toenail actually "broke off." More concerned about pain/ possible ingrown toenail on left foot; has been soaking in salt water; known toenail fungus on left toe also;    Objective:  Vitals:   08/14/20 1404  BP: 140/76  Pulse: (!) 107  Temp: 98.1 F (36.7 C)  TempSrc: Oral  SpO2: 97%  Weight: 156 lb (70.8 kg)  Height: 5' (1.524 m)    General: Well developed, well nourished, in no acute distress  Skin : Warm and dry.  Head: Normocephalic and atraumatic  Lungs: Respirations unlabored;  Musculoskeletal: No deformities; erythema/ ingrown toenail noted on left foot 1st toe Extremities: No edema, cyanosis, clubbing  Vessels: Symmetric bilaterally  Neurologic: Alert and oriented; speech intact; face symmetrical; moves all extremities well; CNII-XII intact without focal deficit  Assessment:  1. Ingrown toenail   2. Needs flu shot     Plan:  Concern for secondary infection- encouraged to soak with Epsom salt soaks; Rx for Doxycycline 100 mg bid x 7 days; refer to podiatrist; reassurance regarding right toenail- would keep area covered until more of the nail grown back;  Flu shot given;   This visit occurred during the SARS-CoV-2 public health emergency.  Safety protocols were in place, including screening questions prior to the visit, additional usage of staff PPE, and extensive cleaning of exam room while observing appropriate contact time as indicated for disinfecting solutions.     No follow-ups on file.  Orders Placed This Encounter  Procedures  . Flu  Vaccine QUAD High Dose(Fluad)  . Ambulatory referral to Podiatry    Referral Priority:   Urgent    Referral Type:   Consultation    Referral Reason:   Specialty Services Required    Requested Specialty:   Podiatry    Number of Visits Requested:   1    Requested Prescriptions   Signed Prescriptions Disp Refills  . doxycycline (VIBRA-TABS) 100 MG tablet 14 tablet 0    Sig: Take 1 tablet (100 mg total) by mouth 2 (two) times daily.

## 2020-08-17 ENCOUNTER — Ambulatory Visit: Payer: PPO | Admitting: Podiatry

## 2020-08-17 ENCOUNTER — Encounter: Payer: Self-pay | Admitting: Podiatry

## 2020-08-17 ENCOUNTER — Other Ambulatory Visit: Payer: Self-pay

## 2020-08-17 DIAGNOSIS — B351 Tinea unguium: Secondary | ICD-10-CM | POA: Diagnosis not present

## 2020-08-17 DIAGNOSIS — M5416 Radiculopathy, lumbar region: Secondary | ICD-10-CM | POA: Diagnosis not present

## 2020-08-17 DIAGNOSIS — L600 Ingrowing nail: Secondary | ICD-10-CM | POA: Diagnosis not present

## 2020-08-17 DIAGNOSIS — M545 Low back pain, unspecified: Secondary | ICD-10-CM | POA: Diagnosis not present

## 2020-08-17 NOTE — Patient Instructions (Signed)
Place 1/4 cup of epsom salts in a quart of warm tap water.  Submerge your foot or feet in the solution and soak for 20 minutes.  This soak should be done twice a day.  Next, remove your foot or feet from solution, blot dry the affected area. Apply ointment and cover if instructed by your doctor.   IF YOUR SKIN BECOMES IRRITATED WHILE USING THESE INSTRUCTIONS, IT IS OKAY TO SWITCH TO  WHITE VINEGAR AND WATER.  As another alternative soak, you may use antibacterial soap and water.  Monitor for any signs/symptoms of infection. Call the office immediately if any occur or go directly to the emergency room. Call with any questions/concerns.  Ingrown Toenail An ingrown toenail occurs when the corner or sides of a toenail grow into the surrounding skin. This causes discomfort and pain. The big toe is most commonly affected, but any of the toes can be affected. If an ingrown toenail is not treated, it can become infected. What are the causes? This condition may be caused by:  Wearing shoes that are too small or tight.  An injury, such as stubbing your toe or having your toe stepped on.  Improper cutting or care of your toenails.  Having nail or foot abnormalities that were present from birth (congenital abnormalities), such as having a nail that is too big for your toe. What increases the risk? The following factors may make you more likely to develop ingrown toenails:  Age. Nails tend to get thicker with age, so ingrown nails are more common among older people.  Cutting your toenails incorrectly, such as cutting them very short or cutting them unevenly. An ingrown toenail is more likely to get infected if you have:  Diabetes.  Blood flow (circulation) problems. What are the signs or symptoms? Symptoms of an ingrown toenail may include:  Pain, soreness, or tenderness.  Redness.  Swelling.  Hardening of the skin that surrounds the toenail. Signs that an ingrown toenail may be infected  include:  Fluid or pus.  Symptoms that get worse instead of better. How is this diagnosed? An ingrown toenail may be diagnosed based on your medical history, your symptoms, and a physical exam. If you have fluid or blood coming from your toenail, a sample may be collected to test for the specific type of bacteria that is causing the infection. How is this treated? Treatment depends on how severe your ingrown toenail is. You may be able to care for your toenail at home.  If you have an infection, you may be prescribed antibiotic medicines.  If you have fluid or pus draining from your toenail, your health care provider may drain it.  If you have trouble walking, you may be given crutches to use.  If you have a severe or infected ingrown toenail, you may need a procedure to remove part or all of the nail. Follow these instructions at home: Foot care   Do not pick at your toenail or try to remove it yourself.  Soak your foot in warm, soapy water. Do this for 20 minutes, 3 times a day, or as often as told by your health care provider. This helps to keep your toe clean and keep your skin soft.  Wear shoes that fit well and are not too tight. Your health care provider may recommend that you wear open-toed shoes while you heal.  Trim your toenails regularly and carefully. Cut your toenails straight across to prevent injury to the skin at the   corners of the toenail. Do not cut your nails in a curved shape.  Keep your feet clean and dry to help prevent infection. Medicines  Take over-the-counter and prescription medicines only as told by your health care provider.  If you were prescribed an antibiotic, take it as told by your health care provider. Do not stop taking the antibiotic even if you start to feel better. Activity  Return to your normal activities as told by your health care provider. Ask your health care provider what activities are safe for you.  Avoid activities that cause  pain. General instructions  If your health care provider told you to use crutches to help you move around, use them as instructed.  Keep all follow-up visits as told by your health care provider. This is important. Contact a health care provider if:  You have more redness, swelling, pain, or other symptoms that do not improve with treatment.  You have fluid, blood, or pus coming from your toenail. Get help right away if:  You have a red streak on your skin that starts at your foot and spreads up your leg.  You have a fever. Summary  An ingrown toenail occurs when the corner or sides of a toenail grow into the surrounding skin. This causes discomfort and pain. The big toe is most commonly affected, but any of the toes can be affected.  If an ingrown toenail is not treated, it can become infected.  Fluid or pus draining from your toenail is a sign of infection. Your health care provider may need to drain it. You may be given antibiotics to treat the infection.  Trimming your toenails regularly and properly can help you prevent an ingrown toenail. This information is not intended to replace advice given to you by your health care provider. Make sure you discuss any questions you have with your health care provider. Document Revised: 01/04/2019 Document Reviewed: 05/31/2017 Elsevier Patient Education  2020 [employer]  

## 2020-08-19 DIAGNOSIS — M5416 Radiculopathy, lumbar region: Secondary | ICD-10-CM | POA: Diagnosis not present

## 2020-08-19 DIAGNOSIS — M545 Low back pain, unspecified: Secondary | ICD-10-CM | POA: Diagnosis not present

## 2020-08-23 NOTE — Progress Notes (Signed)
Subjective:   Patient ID: Alexandra Scott, female   DOB: 74 y.o.   MRN: [id number]   HPI Patient presents stating she has a painful left hallux that has been sore and she is tried to trim it herself without relief and she is tried soaking.  Patient does not currently smoke likes to be active   Review of Systems  All other systems reviewed and are negative.       Objective:  Physical Exam Vitals and nursing note reviewed.  Constitutional:      Appearance: She is well-developed.  Pulmonary:     Effort: Pulmonary effort is normal.  Musculoskeletal:        General: Normal range of motion.  Skin:    General: Skin is warm.  Neurological:     Mental Status: She is alert.     Neurovascular status intact muscle strength was found to be adequate range of motion adequate.  Patient is found to have an incurvated left hallux nail medial border painful when pressed with no active drainage and redness present.  Patient has good digital perfusion well oriented x3     Assessment:  Ingrown toenail deformity left hallux with pain     Plan:  H&P reviewed condition recommended correction.  Patient wants procedure and I explained procedure risk and patient wants surgery.  Today I infiltrated the left hallux 60 mg like Marcaine mixture sterile prep done using sterile instrumentation remove the border exposed matrix and applied phenol 3 applications 30 seconds followed by alcohol lavage and sterile dressing.  Instructed on soaks and to leave dressing on 24 hours take it off earlier if any throbbing were to occur and call with any questions which may come up

## 2020-08-24 ENCOUNTER — Encounter: Payer: Self-pay | Admitting: Internal Medicine

## 2020-08-24 ENCOUNTER — Other Ambulatory Visit: Payer: Self-pay

## 2020-08-24 ENCOUNTER — Ambulatory Visit (INDEPENDENT_AMBULATORY_CARE_PROVIDER_SITE_OTHER): Payer: PPO | Admitting: Internal Medicine

## 2020-08-24 DIAGNOSIS — L600 Ingrowing nail: Secondary | ICD-10-CM

## 2020-08-24 DIAGNOSIS — I10 Essential (primary) hypertension: Secondary | ICD-10-CM | POA: Diagnosis not present

## 2020-08-24 DIAGNOSIS — F419 Anxiety disorder, unspecified: Secondary | ICD-10-CM

## 2020-08-24 DIAGNOSIS — M1990 Unspecified osteoarthritis, unspecified site: Secondary | ICD-10-CM

## 2020-08-24 DIAGNOSIS — K635 Polyp of colon: Secondary | ICD-10-CM | POA: Diagnosis not present

## 2020-08-24 DIAGNOSIS — M5440 Lumbago with sciatica, unspecified side: Secondary | ICD-10-CM | POA: Diagnosis not present

## 2020-08-24 MED ORDER — MUPIROCIN 2 % EX OINT: TOPICAL_OINTMENT | CUTANEOUS | 0 refills | Status: DC

## 2020-08-24 NOTE — Assessment & Plan Note (Signed)
Handicapped DMV form In PT

## 2020-08-24 NOTE — Assessment & Plan Note (Signed)
Bactroban oint 

## 2020-08-24 NOTE — Assessment & Plan Note (Signed)
In PT 

## 2020-08-24 NOTE — Assessment & Plan Note (Signed)
Xanax prn rare use  Potential benefits of a long term opioids use as well as potential risks (i.e. addiction risk, apnea etc) and complications (i.e. Somnolence, constipation and others) were explained to the patient and were aknowledged.

## 2020-08-24 NOTE — Progress Notes (Signed)
Subjective:  Patient ID: Alexandra Scott, female    DOB: 1946/05/16  Age: 74 y.o. MRN: [id number]  CC: Follow-up   HPI TIKESHA MORT presents for HTN, allergies, OA f/u The pt is not vaccinated - advised to get a COVID 19 vaccine ASAP  Outpatient Medications Prior to Visit  Medication Sig Dispense Refill  . amLODipine (NORVASC) 5 MG tablet Take 1 tablet (5 mg total) by mouth daily. 90 tablet 3  . aspirin 81 MG EC tablet Take 81 mg by mouth at bedtime.     Marland Kitchen BIOTIN PO Take 1 tablet by mouth at bedtime.    . Calcium Carbonate-Vitamin D3 (CALCIUM 600/VITAMIN D) 600-400 MG-UNIT TABS Take 1 tablet by mouth 2 (two) times daily.     . Cholecalciferol (VITAMIN D3) 50 MCG (2000 UT) capsule Take 1 capsule (2,000 Units total) by mouth daily. 100 capsule 3  . irbesartan (AVAPRO) 300 MG tablet Take 1 tablet (300 mg total) by mouth daily. 90 tablet 3  . loratadine (CLARITIN) 10 MG tablet Take 1 tablet (10 mg total) by mouth daily. 90 tablet 3  . Magnesium 250 MG TABS Take 250 mg by mouth daily.     . Multiple Vitamins-Minerals (TH COMPLETE MULTI 50+ PO) Take by mouth.    . Omega-3 Fatty Acids (FISH OIL) 1000 MG CAPS Take 1,000 mg by mouth 2 (two) times daily. Reported on 01/11/2016    . Polyethyl Glycol-Propyl Glycol (SYSTANE OP) Place 1 drop into both eyes 2 (two) times daily as needed (dry eyes).    . Turmeric 500 MG TABS Take 500 mg by mouth 2 (two) times daily.     . vitamin C (ASCORBIC ACID) 500 MG tablet Take 500 mg by mouth at bedtime.     Marland Kitchen doxycycline (VIBRA-TABS) 100 MG tablet Take 1 tablet (100 mg total) by mouth 2 (two) times daily. (Patient not taking: Reported on 08/24/2020) 14 tablet 0  . furosemide (LASIX) 20 MG tablet Take 0.5-1 tablets (10-20 mg total) by mouth daily. (Patient not taking: Reported on 08/24/2020) 90 tablet 3   No facility-administered medications prior to visit.    ROS: Review of Systems  Constitutional: Negative for activity change, appetite change,  chills, fatigue and unexpected weight change.  HENT: Negative for congestion, mouth sores and sinus pressure.   Eyes: Negative for visual disturbance.  Respiratory: Negative for cough and chest tightness.   Gastrointestinal: Negative for abdominal pain and nausea.  Genitourinary: Negative for difficulty urinating, frequency and vaginal pain.  Musculoskeletal: Positive for arthralgias, back pain and gait problem.  Skin: Negative for pallor and rash.  Neurological: Negative for dizziness, tremors, weakness, numbness and headaches.  Psychiatric/Behavioral: Negative for confusion and sleep disturbance.    Objective:  BP 128/72   Pulse 86   Temp 98.1 F (36.7 C) (Oral)   Ht 5' (1.524 m)   Wt 157 lb (71.2 kg)   SpO2 99%   BMI 30.66 kg/m   BP Readings from Last 3 Encounters:  08/24/20 128/72  08/14/20 140/76  05/21/20 (!) 150/74    Wt Readings from Last 3 Encounters:  08/24/20 157 lb (71.2 kg)  08/14/20 156 lb (70.8 kg)  05/21/20 161 lb (73 kg)    Physical Exam Constitutional:      General: She is not in acute distress.    Appearance: She is well-developed.  HENT:     Head: Normocephalic.     Right Ear: External ear normal.     Left  Ear: External ear normal.     Nose: Nose normal.  Eyes:     General:        Right eye: No discharge.        Left eye: No discharge.     Conjunctiva/sclera: Conjunctivae normal.     Pupils: Pupils are equal, round, and reactive to light.  Neck:     Thyroid: No thyromegaly.     Vascular: No JVD.     Trachea: No tracheal deviation.  Cardiovascular:     Rate and Rhythm: Normal rate and regular rhythm.     Heart sounds: Normal heart sounds.  Pulmonary:     Effort: No respiratory distress.     Breath sounds: No stridor. No wheezing.  Abdominal:     General: Bowel sounds are normal. There is no distension.     Palpations: Abdomen is soft. There is no mass.     Tenderness: There is no abdominal tenderness. There is no guarding or rebound.    Musculoskeletal:        General: Tenderness present.     Cervical back: Normal range of motion and neck supple.  Lymphadenopathy:     Cervical: No cervical adenopathy.  Skin:    Findings: No erythema or rash.  Neurological:     Mental Status: She is oriented to person, place, and time.     Cranial Nerves: No cranial nerve deficit.     Motor: No abnormal muscle tone.     Coordination: Coordination normal.     Deep Tendon Reflexes: Reflexes normal.  Psychiatric:        Behavior: Behavior normal.        Thought Content: Thought content normal.        Judgment: Judgment normal.   L big toenail - post-op wound LS stiff w/ROM  Lab Results  Component Value Date   WBC 7.2 04/23/2020   HGB 12.5 04/23/2020   HCT 37.4 04/23/2020   PLT 282 04/23/2020   GLUCOSE 111 (H) 04/23/2020   CHOL 147 02/07/2018   TRIG 70.0 02/07/2018   HDL 44.30 02/07/2018   LDLDIRECT 150.6 10/15/2007   LDLCALC 89 02/07/2018   ALT 26 04/23/2020   AST 25 04/23/2020   NA 136 04/23/2020   K 4.5 04/23/2020   CL 99 04/23/2020   CREATININE 0.75 04/23/2020   BUN 15 04/23/2020   CO2 27 04/23/2020   TSH 3.05 04/23/2020   INR 0.97 03/29/2011   HGBA1C 5.4 04/23/2020    DG Bone Density  Result Date: 06/30/2020 Date of study: 06/30/2020 Exam: DUAL X-RAY ABSORPTIOMETRY (DXA) FOR BONE MINERAL DENSITY (BMD) Instrument: [employer] Requesting Provider: PCP Indication: screening for osteoporosis Comparison: 01/16/2017 Clinical data: Pt is a 74 y.o. female without previous fractures. On Calcium and vitamin D. Results:  Lumbar spine L1-L4 (L2) Femoral neck (FN) 33% distal radius T-score +3.4 RFN: n/a LFN: -0.1 n/a Change in BMD from previous DXA test (%) -0.1% -8.0%* n/a (*) statistically significant Assessment: the BMD is normal according to the [facility] classification for osteoporosis (see below). Fracture risk: low FRAX score: not calculated due to normal BMD Comments: the technical quality of the study is good,  however, the spine is scoliotic and arthritic. Calcium accumulation in arthritic sites can confound the results of the bone density scan. L2 vertebra had to be excluded from analysis due to degenerative changes. Recommend optimizing calcium (1200 mg/day) and vitamin D (800 IU/day) intake. No pharmacological treatment is indicated. Followup: Repeat BMD is  appropriate after 2 years. WHO criteria for diagnosis of osteoporosis in postmenopausal women and in men 44 y/o or older: - normal: T-score -1.0 to + 1.0 - osteopenia/low bone density: T-score between -2.5 and -1.0 - osteoporosis: T-score below -2.5 - severe osteoporosis: T-score below -2.5 with history of fragility fracture Note: although not part of the WHO classification, the presence of a fragility fracture, regardless of the T-score, should be considered diagnostic of osteoporosis, provided other causes for the fracture have been excluded. Philemon Kingdom, MD [facility]   Assessment & Plan:    Walker Kehr, MD

## 2020-08-24 NOTE — Assessment & Plan Note (Signed)
BP Readings from Last 3 Encounters:  08/24/20 128/72  08/14/20 140/76  05/21/20 (!) 150/74

## 2020-08-24 NOTE — Assessment & Plan Note (Signed)
GI ref - colon is due

## 2020-08-26 DIAGNOSIS — M545 Low back pain, unspecified: Secondary | ICD-10-CM | POA: Diagnosis not present

## 2020-08-26 DIAGNOSIS — M5416 Radiculopathy, lumbar region: Secondary | ICD-10-CM | POA: Diagnosis not present

## 2020-09-01 DIAGNOSIS — M545 Low back pain, unspecified: Secondary | ICD-10-CM | POA: Diagnosis not present

## 2020-09-01 DIAGNOSIS — M5416 Radiculopathy, lumbar region: Secondary | ICD-10-CM | POA: Diagnosis not present

## 2020-09-03 DIAGNOSIS — M5416 Radiculopathy, lumbar region: Secondary | ICD-10-CM | POA: Diagnosis not present

## 2020-09-03 DIAGNOSIS — M545 Low back pain, unspecified: Secondary | ICD-10-CM | POA: Diagnosis not present

## 2020-09-08 DIAGNOSIS — M5416 Radiculopathy, lumbar region: Secondary | ICD-10-CM | POA: Diagnosis not present

## 2020-09-08 DIAGNOSIS — M545 Low back pain, unspecified: Secondary | ICD-10-CM | POA: Diagnosis not present

## 2020-09-24 DIAGNOSIS — M5416 Radiculopathy, lumbar region: Secondary | ICD-10-CM | POA: Diagnosis not present

## 2020-09-24 DIAGNOSIS — M545 Low back pain, unspecified: Secondary | ICD-10-CM | POA: Diagnosis not present

## 2020-09-30 DIAGNOSIS — M5416 Radiculopathy, lumbar region: Secondary | ICD-10-CM | POA: Diagnosis not present

## 2020-09-30 DIAGNOSIS — M545 Low back pain, unspecified: Secondary | ICD-10-CM | POA: Diagnosis not present

## 2020-10-07 DIAGNOSIS — M545 Low back pain, unspecified: Secondary | ICD-10-CM | POA: Diagnosis not present

## 2020-10-07 DIAGNOSIS — M5416 Radiculopathy, lumbar region: Secondary | ICD-10-CM | POA: Diagnosis not present

## 2020-10-14 DIAGNOSIS — M5416 Radiculopathy, lumbar region: Secondary | ICD-10-CM | POA: Diagnosis not present

## 2020-10-14 DIAGNOSIS — M545 Low back pain, unspecified: Secondary | ICD-10-CM | POA: Diagnosis not present

## 2020-10-21 DIAGNOSIS — M48062 Spinal stenosis, lumbar region with neurogenic claudication: Secondary | ICD-10-CM | POA: Diagnosis not present

## 2020-10-21 DIAGNOSIS — H02055 Trichiasis without entropian left lower eyelid: Secondary | ICD-10-CM | POA: Diagnosis not present

## 2020-10-21 DIAGNOSIS — M5416 Radiculopathy, lumbar region: Secondary | ICD-10-CM | POA: Diagnosis not present

## 2020-10-21 DIAGNOSIS — M4180 Other forms of scoliosis, site unspecified: Secondary | ICD-10-CM | POA: Diagnosis not present

## 2020-10-21 DIAGNOSIS — H10022 Other mucopurulent conjunctivitis, left eye: Secondary | ICD-10-CM | POA: Diagnosis not present

## 2020-10-21 DIAGNOSIS — M4316 Spondylolisthesis, lumbar region: Secondary | ICD-10-CM | POA: Diagnosis not present

## 2020-11-06 ENCOUNTER — Encounter: Payer: Self-pay | Admitting: Gastroenterology

## 2020-11-17 ENCOUNTER — Other Ambulatory Visit: Payer: Self-pay | Admitting: Internal Medicine

## 2020-11-20 ENCOUNTER — Other Ambulatory Visit: Payer: Self-pay

## 2020-11-23 ENCOUNTER — Other Ambulatory Visit: Payer: Self-pay

## 2020-11-23 ENCOUNTER — Ambulatory Visit (INDEPENDENT_AMBULATORY_CARE_PROVIDER_SITE_OTHER): Payer: PPO | Admitting: Internal Medicine

## 2020-11-23 ENCOUNTER — Encounter: Payer: Self-pay | Admitting: Internal Medicine

## 2020-11-23 DIAGNOSIS — I10 Essential (primary) hypertension: Secondary | ICD-10-CM

## 2020-11-23 DIAGNOSIS — M5440 Lumbago with sciatica, unspecified side: Secondary | ICD-10-CM

## 2020-11-23 DIAGNOSIS — R140 Abdominal distension (gaseous): Secondary | ICD-10-CM | POA: Insufficient documentation

## 2020-11-23 DIAGNOSIS — Z1231 Encounter for screening mammogram for malignant neoplasm of breast: Secondary | ICD-10-CM | POA: Diagnosis not present

## 2020-11-23 DIAGNOSIS — D649 Anemia, unspecified: Secondary | ICD-10-CM

## 2020-11-23 LAB — CBC WITH DIFFERENTIAL/PLATELET
Basophils Absolute: 0.1 10*3/uL (ref 0.0–0.1)
Basophils Relative: 0.6 % (ref 0.0–3.0)
Eosinophils Absolute: 0.2 10*3/uL (ref 0.0–0.7)
Eosinophils Relative: 2.8 % (ref 0.0–5.0)
HCT: 39.1 % (ref 36.0–46.0)
Hemoglobin: 13.4 g/dL (ref 12.0–15.0)
Lymphocytes Relative: 24.3 % (ref 12.0–46.0)
Lymphs Abs: 2 10*3/uL (ref 0.7–4.0)
MCHC: 34.3 g/dL (ref 30.0–36.0)
MCV: 90.3 fl (ref 78.0–100.0)
Monocytes Absolute: 0.8 10*3/uL (ref 0.1–1.0)
Monocytes Relative: 9.6 % (ref 3.0–12.0)
Neutro Abs: 5 10*3/uL (ref 1.4–7.7)
Neutrophils Relative %: 62.7 % (ref 43.0–77.0)
Platelets: 265 10*3/uL (ref 150.0–400.0)
RBC: 4.34 Mil/uL (ref 3.87–5.11)
RDW: 13.4 % (ref 11.5–15.5)
WBC: 8 10*3/uL (ref 4.0–10.5)

## 2020-11-23 LAB — COMPREHENSIVE METABOLIC PANEL
ALT: 22 U/L (ref 0–35)
AST: 21 U/L (ref 0–37)
Albumin: 4.6 g/dL (ref 3.5–5.2)
Alkaline Phosphatase: 76 U/L (ref 39–117)
BUN: 17 mg/dL (ref 6–23)
CO2: 28 mEq/L (ref 19–32)
Calcium: 10.2 mg/dL (ref 8.4–10.5)
Chloride: 99 mEq/L (ref 96–112)
Creatinine, Ser: 0.76 mg/dL (ref 0.40–1.20)
GFR: 77.27 mL/min (ref >60.00)
Glucose, Bld: 104 mg/dL — ABNORMAL HIGH (ref 70–99)
Potassium: 4.5 mEq/L (ref 3.5–5.1)
Sodium: 136 mEq/L (ref 135–145)
Total Bilirubin: 0.9 mg/dL (ref 0.2–1.2)
Total Protein: 7.9 g/dL (ref 6.0–8.3)

## 2020-11-23 LAB — HM MAMMOGRAPHY

## 2020-11-23 MED ORDER — IRBESARTAN 300 MG PO TABS: 300.0000 mg | ORAL_TABLET | Freq: Every day | ORAL | 0 refills | Status: DC

## 2020-11-23 NOTE — Assessment & Plan Note (Signed)
CBC

## 2020-11-23 NOTE — Assessment & Plan Note (Signed)
LBP - a little better - seeing Dr Vertell Limber - has not had surgery or injections

## 2020-11-23 NOTE — Assessment & Plan Note (Signed)
Colonoscopy is pending

## 2020-11-23 NOTE — Progress Notes (Signed)
Subjective:  Patient ID: Alexandra Scott, female    DOB: 07/06/1946  Age: 75 y.o. MRN: [id number]  CC: Follow-up (3 month f/u)   HPI Alexandra Scott presents for bloating, HTN, allergies F/u on LBP - a little better - seeing Dr Vertell Limber - has not had surgery  Outpatient Medications Prior to Visit  Medication Sig Dispense Refill  . amLODipine (NORVASC) 5 MG tablet Take 1 tablet (5 mg total) by mouth daily. Annual appt w/labs due in April must see provider for future refills 90 tablet 0  . aspirin 81 MG EC tablet Take 81 mg by mouth at bedtime.    Marland Kitchen BIOTIN PO Take 1 tablet by mouth at bedtime.    . Calcium Carbonate-Vitamin D3 600-400 MG-UNIT TABS Take 1 tablet by mouth 2 (two) times daily.     . Cholecalciferol (VITAMIN D3) 50 MCG (2000 UT) capsule Take 1 capsule (2,000 Units total) by mouth daily. 100 capsule 3  . loratadine (CLARITIN) 10 MG tablet Take 1 tablet (10 mg total) by mouth daily. 90 tablet 3  . Magnesium 250 MG TABS Take 250 mg by mouth daily.     . Multiple Vitamins-Minerals (TH COMPLETE MULTI 50+ PO) Take by mouth.    . mupirocin ointment (BACTROBAN) 2 % On leg wound w/dressing change qd or bid 30 g 0  . Omega-3 Fatty Acids (FISH OIL) 1000 MG CAPS Take 1,000 mg by mouth 2 (two) times daily. Reported on 01/11/2016    . Polyethyl Glycol-Propyl Glycol (SYSTANE OP) Place 1 drop into both eyes 2 (two) times daily as needed (dry eyes).    . Turmeric 500 MG TABS Take 500 mg by mouth 2 (two) times daily.     . vitamin C (ASCORBIC ACID) 500 MG tablet Take 500 mg by mouth at bedtime.     . furosemide (LASIX) 20 MG tablet Take 0.5-1 tablets (10-20 mg total) by mouth daily. (Patient not taking: No sig reported) 90 tablet 3  . doxycycline (VIBRA-TABS) 100 MG tablet Take 1 tablet (100 mg total) by mouth 2 (two) times daily. (Patient not taking: Reported on 08/24/2020) 14 tablet 0  . irbesartan (AVAPRO) 300 MG tablet Take 1 tablet (300 mg total) by mouth daily. 90 tablet 3   No  facility-administered medications prior to visit.    ROS: Review of Systems  Constitutional: Negative for activity change, appetite change, chills, fatigue and unexpected weight change.  HENT: Negative for congestion, mouth sores and sinus pressure.   Eyes: Negative for visual disturbance.  Respiratory: Negative for cough and chest tightness.   Gastrointestinal: Negative for abdominal pain and nausea.  Genitourinary: Negative for difficulty urinating, frequency and vaginal pain.  Musculoskeletal: Positive for arthralgias, back pain and gait problem.  Skin: Negative for pallor and rash.  Neurological: Negative for dizziness, tremors, weakness, numbness and headaches.  Psychiatric/Behavioral: Negative for confusion and sleep disturbance.    Objective:  BP 136/82 (BP Location: Left Arm)   Pulse 87   Temp 98.6 F (37 C) (Oral)   Ht 5' (1.524 m)   Wt 157 lb 9.6 oz (71.5 kg)   SpO2 95%   BMI 30.78 kg/m   BP Readings from Last 3 Encounters:  11/23/20 136/82  08/24/20 128/72  08/14/20 140/76    Wt Readings from Last 3 Encounters:  11/23/20 157 lb 9.6 oz (71.5 kg)  08/24/20 157 lb (71.2 kg)  08/14/20 156 lb (70.8 kg)    Physical Exam Constitutional:  General: She is not in acute distress.    Appearance: She is well-developed. She is obese.  HENT:     Head: Normocephalic.     Right Ear: External ear normal.     Left Ear: External ear normal.     Nose: Nose normal.     Mouth/Throat:     Mouth: Oropharynx is clear and moist.  Eyes:     General:        Right eye: No discharge.        Left eye: No discharge.     Conjunctiva/sclera: Conjunctivae normal.     Pupils: Pupils are equal, round, and reactive to light.  Neck:     Thyroid: No thyromegaly.     Vascular: No JVD.     Trachea: No tracheal deviation.  Cardiovascular:     Rate and Rhythm: Normal rate and regular rhythm.     Heart sounds: Normal heart sounds.  Pulmonary:     Effort: No respiratory distress.      Breath sounds: No stridor. No wheezing.  Abdominal:     General: Bowel sounds are normal. There is no distension.     Palpations: Abdomen is soft. There is no mass.     Tenderness: There is no abdominal tenderness. There is no guarding or rebound.  Musculoskeletal:        General: Tenderness present. No edema.     Cervical back: Normal range of motion and neck supple.  Lymphadenopathy:     Cervical: No cervical adenopathy.  Skin:    Findings: No erythema or rash.  Neurological:     Cranial Nerves: No cranial nerve deficit.     Motor: No abnormal muscle tone.     Coordination: Coordination normal.     Deep Tendon Reflexes: Reflexes normal.  Psychiatric:        Mood and Affect: Mood and affect normal.        Behavior: Behavior normal.        Thought Content: Thought content normal.        Judgment: Judgment normal.    No HSM, NT  Lab Results  Component Value Date   WBC 7.2 04/23/2020   HGB 12.5 04/23/2020   HCT 37.4 04/23/2020   PLT 282 04/23/2020   GLUCOSE 111 (H) 04/23/2020   CHOL 147 02/07/2018   TRIG 70.0 02/07/2018   HDL 44.30 02/07/2018   LDLDIRECT 150.6 10/15/2007   LDLCALC 89 02/07/2018   ALT 26 04/23/2020   AST 25 04/23/2020   NA 136 04/23/2020   K 4.5 04/23/2020   CL 99 04/23/2020   CREATININE 0.75 04/23/2020   BUN 15 04/23/2020   CO2 27 04/23/2020   TSH 3.05 04/23/2020   INR 0.97 03/29/2011   HGBA1C 5.4 04/23/2020    DG Bone Density  Result Date: 06/30/2020 Date of study: 06/30/2020 Exam: DUAL X-RAY ABSORPTIOMETRY (DXA) FOR BONE MINERAL DENSITY (BMD) Instrument: [employer] Requesting Provider: PCP Indication: screening for osteoporosis Comparison: 01/16/2017 Clinical data: Pt is a 75 y.o. female without previous fractures. On Calcium and vitamin D. Results:  Lumbar spine L1-L4 (L2) Femoral neck (FN) 33% distal radius T-score +3.4 RFN: n/a LFN: -0.1 n/a Change in BMD from previous DXA test (%) -0.1% -8.0%* n/a (*) statistically significant  Assessment: the BMD is normal according to the [facility] classification for osteoporosis (see below). Fracture risk: low FRAX score: not calculated due to normal BMD Comments: the technical quality of the study is good, however, the spine  is scoliotic and arthritic. Calcium accumulation in arthritic sites can confound the results of the bone density scan. L2 vertebra had to be excluded from analysis due to degenerative changes. Recommend optimizing calcium (1200 mg/day) and vitamin D (800 IU/day) intake. No pharmacological treatment is indicated. Followup: Repeat BMD is appropriate after 2 years. WHO criteria for diagnosis of osteoporosis in postmenopausal women and in men 1 y/o or older: - normal: T-score -1.0 to + 1.0 - osteopenia/low bone density: T-score between -2.5 and -1.0 - osteoporosis: T-score below -2.5 - severe osteoporosis: T-score below -2.5 with history of fragility fracture Note: although not part of the WHO classification, the presence of a fragility fracture, regardless of the T-score, should be considered diagnostic of osteoporosis, provided other causes for the fracture have been excluded. Philemon Kingdom, MD [facility]   Assessment & Plan:   There are no diagnoses linked to this encounter.   Meds ordered this encounter  Medications  . irbesartan (AVAPRO) 300 MG tablet    Sig: Take 1 tablet (300 mg total) by mouth daily.    Dispense:  90 tablet    Refill:  0     Follow-up: No follow-ups on file.  Walker Kehr, MD

## 2020-11-23 NOTE — Assessment & Plan Note (Signed)
Cont w/Irbesartan, Amlodipine 5 mg 

## 2020-11-24 ENCOUNTER — Encounter: Payer: Self-pay | Admitting: Gastroenterology

## 2020-11-26 ENCOUNTER — Encounter: Payer: Self-pay | Admitting: Internal Medicine

## 2020-12-09 ENCOUNTER — Ambulatory Visit (AMBULATORY_SURGERY_CENTER): Payer: Self-pay | Admitting: *Deleted

## 2020-12-09 ENCOUNTER — Other Ambulatory Visit: Payer: Self-pay

## 2020-12-09 VITALS — Ht 60.0 in | Wt 157.0 lb

## 2020-12-09 DIAGNOSIS — Z800 Family history of malignant neoplasm of digestive organs: Secondary | ICD-10-CM

## 2020-12-09 MED ORDER — PEG 3350-KCL-NA BICARB-NACL 420 G PO SOLR: 4000.0000 mL | Freq: Once | ORAL | 0 refills | Status: AC

## 2020-12-09 NOTE — Progress Notes (Signed)

## 2020-12-16 ENCOUNTER — Encounter: Payer: Self-pay | Admitting: Gastroenterology

## 2020-12-22 ENCOUNTER — Encounter: Payer: Self-pay | Admitting: Certified Registered Nurse Anesthetist

## 2020-12-23 ENCOUNTER — Encounter: Payer: Self-pay | Admitting: Gastroenterology

## 2020-12-23 ENCOUNTER — Other Ambulatory Visit: Payer: Self-pay

## 2020-12-23 ENCOUNTER — Ambulatory Visit (AMBULATORY_SURGERY_CENTER): Payer: PPO | Admitting: Gastroenterology

## 2020-12-23 VITALS — BP 102/63 | HR 72 | Temp 98.4°F | Resp 15 | Ht 60.0 in | Wt 157.0 lb

## 2020-12-23 DIAGNOSIS — K635 Polyp of colon: Secondary | ICD-10-CM

## 2020-12-23 DIAGNOSIS — D124 Benign neoplasm of descending colon: Secondary | ICD-10-CM

## 2020-12-23 DIAGNOSIS — Z86010 Personal history of colonic polyps: Secondary | ICD-10-CM | POA: Diagnosis not present

## 2020-12-23 DIAGNOSIS — K219 Gastro-esophageal reflux disease without esophagitis: Secondary | ICD-10-CM | POA: Diagnosis not present

## 2020-12-23 DIAGNOSIS — Z800 Family history of malignant neoplasm of digestive organs: Secondary | ICD-10-CM | POA: Diagnosis not present

## 2020-12-23 DIAGNOSIS — I10 Essential (primary) hypertension: Secondary | ICD-10-CM | POA: Diagnosis not present

## 2020-12-23 MED ORDER — SODIUM CHLORIDE 0.9 % IV SOLN
500.0000 mL | Freq: Once | INTRAVENOUS | Status: DC
Start: 2020-12-23 — End: 2020-12-23

## 2020-12-23 NOTE — Progress Notes (Signed)
C.W. vital signs. 

## 2020-12-23 NOTE — Progress Notes (Signed)
Called to room to assist during endoscopic procedure.  Patient ID and intended procedure confirmed with present staff. Received instructions for my participation in the procedure from the performing physician.  

## 2020-12-23 NOTE — Patient Instructions (Signed)
YOU HAD AN ENDOSCOPIC PROCEDURE TODAY AT THE [facility]:   Refer to the procedure report that was given to you for any specific questions about what was found during the examination.  If the procedure report does not answer your questions, please call your gastroenterologist to clarify.  If you requested that your care partner not be given the details of your procedure findings, then the procedure report has been included in a sealed envelope for you to review at your convenience later.  YOU SHOULD EXPECT: Some feelings of bloating in the abdomen. Passage of more gas than usual.  Walking can help get rid of the air that was put into your GI tract during the procedure and reduce the bloating. If you had a lower endoscopy (such as a colonoscopy or flexible sigmoidoscopy) you may notice spotting of blood in your stool or on the toilet paper. If you underwent a bowel prep for your procedure, you may not have a normal bowel movement for a few days.  Please Note:  You might notice some irritation and congestion in your nose or some drainage.  This is from the oxygen used during your procedure.  There is no need for concern and it should clear up in a day or so.  SYMPTOMS TO REPORT IMMEDIATELY:   Following lower endoscopy (colonoscopy or flexible sigmoidoscopy):  Excessive amounts of blood in the stool  Significant tenderness or worsening of abdominal pains  Swelling of the abdomen that is new, acute  Fever of 100F or higher   Following upper endoscopy (EGD)  Vomiting of blood or coffee ground material  New chest pain or pain under the shoulder blades  Painful or persistently difficult swallowing  New shortness of breath  Fever of 100F or higher  Black, tarry-looking stools  For urgent or emergent issues, a gastroenterologist can be reached at any hour by calling [phone]. Do not use MyChart messaging for urgent concerns.    DIET:  We do recommend a small meal at first, but  then you may proceed to your regular diet.  Drink plenty of fluids but you should avoid alcoholic beverages for 24 hours.  ACTIVITY:  You should plan to take it easy for the rest of today and you should NOT DRIVE or use heavy machinery until tomorrow (because of the sedation medicines used during the test).    FOLLOW UP: Our staff will call the number listed on your records 48-72 hours following your procedure to check on you and address any questions or concerns that you may have regarding the information given to you following your procedure. If we do not reach you, we will leave a message.  We will attempt to reach you two times.  During this call, we will ask if you have developed any symptoms of COVID 19. If you develop any symptoms (ie: fever, flu-like symptoms, shortness of breath, cough etc.) before then, please call [phone].  If you test positive for Covid 19 in the 2 weeks post procedure, please call and report this information to us.    If any biopsies were taken you will be contacted by phone or by letter within the next 1-3 weeks.  Please call us at [phone] if you have not heard about the biopsies in 3 weeks.    SIGNATURES/CONFIDENTIALITY: You and/or your care partner have signed paperwork which will be entered into your electronic medical record.  These signatures attest to the fact that that the information above on   your After Visit Summary has been reviewed and is understood.  Full responsibility of the confidentiality of this discharge information lies with you and/or your care-partner. 

## 2020-12-23 NOTE — Progress Notes (Signed)
Pt's states no medical or surgical changes since previsit or office visit. 

## 2020-12-23 NOTE — Op Note (Signed)
[place] Patient Name: Alexandra Scott Procedure Date: 12/23/2020 2:39 PM MRN: [id number] Endoscopist: Ladene Artist , MD Age: 75 Referring MD:  Date of Birth: [birth year] Gender: Female Account #: [id number] Procedure:                Colonoscopy Indications:              Screening in patient at increased risk: Family                            history of 1st-degree relative with colorectal                            cancer Medicines:                Monitored Anesthesia Care Procedure:                Pre-Anesthesia Assessment:                           - Prior to the procedure, a History and Physical                            was performed, and patient medications and                            allergies were reviewed. The patient's tolerance of                            previous anesthesia was also reviewed. The risks                            and benefits of the procedure and the sedation                            options and risks were discussed with the patient.                            All questions were answered, and informed consent                            was obtained. Prior Anticoagulants: The patient has                            taken no previous anticoagulant or antiplatelet                            agents. ASA Grade Assessment: II - A patient with                            mild systemic disease. After reviewing the risks                            and benefits, the patient was deemed in  satisfactory condition to undergo the procedure.                           After obtaining informed consent, the colonoscope                            was passed under direct vision. Throughout the                            procedure, the patient's blood pressure, pulse, and                            oxygen saturations were monitored continuously. The                            Olympus PCF-H190DL (#[id number]) Colonoscope was                             introduced through the anus and advanced to the the                            cecum, identified by appendiceal orifice and                            ileocecal valve. The ileocecal valve, appendiceal                            orifice, and rectum were photographed. The quality                            of the bowel preparation was adequate after                            extensive lavage, suction. The colonoscopy was                            performed without difficulty. The patient tolerated                            the procedure well. Scope In: 2:45:23 PM Scope Out: 3:05:52 PM Scope Withdrawal Time: 0 hours 14 minutes 25 seconds  Total Procedure Duration: 0 hours 20 minutes 29 seconds  Findings:                 The perianal and digital rectal examinations were                            normal.                           Multiple medium-mouthed diverticula were found in                            the right colon. Peri-diverticular erythema was  seen. There was evidence of an impacted                            diverticulum.                           A 5 mm polyp was found in the descending colon. The                            polyp was sessile. The polyp was removed with a                            cold snare. Resection and retrieval were complete.                           Multiple medium-mouthed diverticula were found in                            the left colon. There was narrowing of the colon in                            association with the diverticular opening.                            Peri-diverticular erythema was seen. There was                            evidence of an impacted diverticulum. There was no                            evidence of diverticular bleeding.                           Internal hemorrhoids were found during                            retroflexion. The hemorrhoids were small and Grade                             I (internal hemorrhoids that do not prolapse).                           The exam was otherwise without abnormality on                            direct and retroflexion views. Complications:            No immediate complications. Estimated blood loss:                            None. Estimated Blood Loss:     Estimated blood loss: none. Impression:               - Moderate diverticulosis in the right colon.                           -  One 5 mm polyp in the descending colon, removed                            with a cold snare. Resected and retrieved.                           - Severe diverticulosis in the left colon.                           - Internal hemorrhoids.                           - The examination was otherwise normal on direct                            and retroflexion views. Recommendation:           - Patient has a contact number available for                            emergencies. The signs and symptoms of potential                            delayed complications were discussed with the                            patient. Return to normal activities tomorrow.                            Written discharge instructions were provided to the                            patient.                           - High fiber diet with at least 6 glasses of water                            daily.                           - Continue present medications.                           - Await pathology results.                           - No repeat colonoscopy due to age. Ladene Artist, MD 12/23/2020 3:10:25 PM This report has been signed electronically.

## 2020-12-25 ENCOUNTER — Telehealth: Payer: Self-pay | Admitting: *Deleted

## 2020-12-25 NOTE — Telephone Encounter (Signed)
  Follow up Call-  Call back number 12/23/2020  Post procedure Call Back phone  # [phone]  Permission to leave phone message Yes  Some recent data might be hidden     Patient questions:  Do you have a fever, pain , or abdominal swelling? No. Pain Score  0 *  Have you tolerated food without any problems? Yes.    Have you been able to return to your normal activities? Yes.    Do you have any questions about your discharge instructions: Diet   No. Medications  No. Follow up visit  No.  Do you have questions or concerns about your Care? No.  Actions: * If pain score is 4 or above: 1. No action needed, pain <4.Have you developed a fever since your procedure? no  2.   Have you had an respiratory symptoms (SOB or cough) since your procedure? no  3.   Have you tested positive for COVID 19 since your procedure no  4.   Have you had any family members/close contacts diagnosed with the COVID 19 since your procedure?  no   If yes to any of these questions please route to Joylene John, RN and Joella Prince, RN

## 2020-12-30 ENCOUNTER — Encounter: Payer: Self-pay | Admitting: Gastroenterology

## 2021-02-02 ENCOUNTER — Other Ambulatory Visit: Payer: Self-pay | Admitting: Obstetrics & Gynecology

## 2021-02-09 ENCOUNTER — Other Ambulatory Visit (HOSPITAL_COMMUNITY): Payer: Self-pay | Admitting: Surgery

## 2021-02-09 ENCOUNTER — Other Ambulatory Visit: Payer: Self-pay | Admitting: Surgery

## 2021-02-09 DIAGNOSIS — D241 Benign neoplasm of right breast: Secondary | ICD-10-CM

## 2021-02-09 DIAGNOSIS — D242 Benign neoplasm of left breast: Secondary | ICD-10-CM

## 2021-02-10 ENCOUNTER — Other Ambulatory Visit: Payer: Self-pay

## 2021-02-10 ENCOUNTER — Ambulatory Visit (HOSPITAL_COMMUNITY)
Admission: RE | Admit: 2021-02-10 | Discharge: 2021-02-10 | Disposition: A | Discharge status: Home / Self Care | Payer: PPO | Source: Ambulatory Visit | Attending: Surgery | Admitting: Surgery

## 2021-02-10 DIAGNOSIS — R928 Other abnormal and inconclusive findings on diagnostic imaging of breast: Secondary | ICD-10-CM | POA: Diagnosis not present

## 2021-02-10 DIAGNOSIS — D241 Benign neoplasm of right breast: Secondary | ICD-10-CM | POA: Diagnosis not present

## 2021-02-10 MED ORDER — GADOBUTROL 1 MMOL/ML IV SOLN
7.0000 mL | Freq: Once | INTRAVENOUS | Status: AC | PRN
  Administered 2021-02-10: 7 mL via INTRAVENOUS

## 2021-02-15 ENCOUNTER — Other Ambulatory Visit: Payer: Self-pay | Admitting: Internal Medicine

## 2021-02-19 ENCOUNTER — Ambulatory Visit: Payer: Self-pay | Admitting: Surgery

## 2021-02-19 DIAGNOSIS — N6452 Nipple discharge: Secondary | ICD-10-CM | POA: Diagnosis not present

## 2021-02-19 NOTE — H&P (Signed)
History of Present Illness Alexandra Scott. Alexandra Vernier MD; 02/19/2021 12:10 PM) The patient is a 75 year old female who presents with a complaint of nipple discharge. Referred by Dr. Stann Mainland for right nipple discharge.  This is a 75 year old female who is status post left nipple duct excision in August 2015 for nipple discharge. I excised the central nipple along with a cone of breast tissue. Pathology revealed an intraductal papilloma. The patient has been doing well with her annual mammograms. 3 months ago she began having persistent yellowish drainage coming from her right nipple. She had an MRI earlier in the week that was unremarkable. She is having a bit of tenderness right at the 100 nipple. She presents now to discuss possible surgical intervention.  CLINICAL DATA: History of a breast excision for an intraductal papilloma. Patient believes that this was on the right side, but imaging and prior history indicates that this was on the left.  LABS: No labs drawn at time of imaging.  EXAM: BILATERAL BREAST MRI WITH AND WITHOUT CONTRAST  TECHNIQUE: Multiplanar, multisequence MR images of both breasts were obtained prior to and following the intravenous administration of 7 ml of Gadavist  Three-dimensional MR images were rendered by post-processing of the original MR data on an independent workstation. The three-dimensional MR images were interpreted, and findings are reported in the following complete MRI report for this study. Three dimensional images were evaluated at the independent interpreting workstation using the DynaCAD thin client.  COMPARISON: Prior exams including the previous breast MRI dated 08/27/2008.  FINDINGS: Breast composition: b. Scattered fibroglandular tissue.  Background parenchymal enhancement: Moderate.  Right breast: No mass or abnormal enhancement.  Left breast: No mass or abnormal enhancement. There is architectural distortion consistent with  postsurgical scarring in the retroareolar left breast.  Lymph nodes: No abnormal appearing lymph nodes.  Ancillary findings: None.  IMPRESSION: 1. No MRI evidence of breast malignancy. 2. Benign postsurgical scarring in the retroareolar left breast.  RECOMMENDATION: Annual screening mammography.  BI-RADS CATEGORY 2: Benign.   Electronically Signed By: Lajean Manes M.D. On: 02/15/2021 13:34   Problem List/Past Medical Rodman Key K. Juma Oxley, MD; 02/19/2021 12:10 PM) INTRADUCTAL PAPILLOMA OF BREAST, LEFT (D24.2) DISCHARGE FROM RIGHT NIPPLE (O75.64)  Past Surgical History (Karion Cudd K. Jamarria Real, MD; 02/19/2021 12:10 PM) Colon Polyp Removal - Colonoscopy Hip Surgery Right.  Diagnostic Studies History Alexandra Scott. Jenna Ardoin, MD; 02/19/2021 12:10 PM) Colonoscopy 1-5 years ago Mammogram within last year Pap Smear 1-5 years ago  Allergies Jess Barters, CMA; 02/19/2021 11:21 AM) Corticosteroids Allergies Reconciled  Medication History Jess Barters, CMA; 02/19/2021 11:21 AM) Amoxicillin (500MG  Capsule, Oral) Active. Irbesartan (300MG  Tablet, Oral) Active. Mupirocin (2% Ointment, External) Active. Ofloxacin (0.3% Solution, Ophthalmic) Active. amLODIPine Besylate-Valsartan (10-160MG  Tablet, Oral) Active. Benazepril HCl (40MG  Tablet, Oral daily) Active. Triamterene-HCTZ (37.5-25MG  Tablet, Oral four times daily, as needed) Active. Medications Reconciled  Social History Alexandra Scott. Mishael Krysiak, MD; 02/19/2021 12:10 PM) Caffeine use Coffee, Tea. No alcohol use No drug use Tobacco use Never smoker.  Family History Alexandra Scott. Ansley Stanwood, MD; 02/19/2021 12:10 PM) Arthritis Mother. Breast Cancer Family Members In General, Mother. Cancer Brother. Cerebrovascular Accident Mother. Heart Disease Father. Heart disease in female family member before age 9 Hypertension Brother, Mother. Respiratory Condition Father.  Pregnancy / Birth History Alexandra Scott. Joven Mom, MD; 02/19/2021 12:10  PM) Age at menarche 72 years. Age of menopause 100-50 Contraceptive History Oral contraceptives. Gravida 2 Maternal age 50-30 Para 2 Regular periods  Other Problems Alexandra Scott. Bailen Geffre, MD; 02/19/2021 12:10 PM) Arthritis  Diverticulosis High blood pressure Lump In Breast    Vitals (Ania Jones CMA; 02/19/2021 11:22 AM) 02/19/2021 11:21 AM Weight: 161 lb Height: 59in Body Surface Area: 1.68 m Body Mass Index: 32.52 kg/m  Temp.: 97.60F  Pulse: 96 (Regular)  P.OX: 100% (Room air) BP: 136/70(Sitting, Left Arm, Standard)        Physical Exam Rodman Key K. Grisela Mesch MD; 02/19/2021 12:11 PM)  The physical exam findings are as follows: Note:Constitutional: WDWN in NAD, conversant, no obvious deformities; resting comfortably Eyes: Pupils equal, round; sclera anicteric; moist conjunctiva; no lid lag HENT: Oral mucosa moist; good dentition Neck: No masses palpated, trachea midline; no thyromegaly Lungs: CTA bilaterally; normal respiratory effort CV: Regular rate and rhythm; no murmurs; extremities well-perfused with no edema Breasts: symmetric; no nipple retraction; well-healed left circumareolar incision; no left breast masses or lymphadenopathy Right central nipple shows an enlarged duct opening. Yellowish fluid is easily expressed. Mild tenderness in the retroareolar region. No other breast masses palpated. No lymphadenopathy Abd: +bowel sounds, soft, non-tender, no palpable organomegaly; no palpable hernias Musc: Normal gait; no apparent clubbing or cyanosis in extremities Lymphatic: No palpable cervical or axillary lymphadenopathy Skin: Warm, dry; no sign of jaundice Psychiatric - alert and oriented x 4; calm mood and affect    Assessment & Plan Rodman Key K. Rj Pedrosa MD; 02/19/2021 11:51 AM)  DISCHARGE FROM RIGHT NIPPLE (N64.52)  Current Plans Schedule for Surgery - Right nipple duct excision. The surgical procedure has been discussed with the patient.  Potential risks, benefits, alternative treatments, and expected outcomes have been explained. All of the patient's questions at this time have been answered. The likelihood of reaching the patient's treatment goal is good. The patient understand the proposed surgical procedure and wishes to proceed.  Alexandra Scott. Georgette Dover, MD, [facility]  General/ Trauma Surgery   02/19/2021 12:12 PM

## 2021-02-23 ENCOUNTER — Encounter: Payer: Self-pay | Admitting: Internal Medicine

## 2021-02-23 ENCOUNTER — Other Ambulatory Visit: Payer: Self-pay

## 2021-02-23 ENCOUNTER — Ambulatory Visit (INDEPENDENT_AMBULATORY_CARE_PROVIDER_SITE_OTHER): Payer: PPO | Admitting: Internal Medicine

## 2021-02-23 VITALS — BP 142/80 | HR 81 | Temp 98.3°F | Ht 60.0 in | Wt 159.2 lb

## 2021-02-23 DIAGNOSIS — F419 Anxiety disorder, unspecified: Secondary | ICD-10-CM

## 2021-02-23 DIAGNOSIS — Z23 Encounter for immunization: Secondary | ICD-10-CM

## 2021-02-23 DIAGNOSIS — I10 Essential (primary) hypertension: Secondary | ICD-10-CM | POA: Diagnosis not present

## 2021-02-23 DIAGNOSIS — N6452 Nipple discharge: Secondary | ICD-10-CM

## 2021-02-23 LAB — COMPREHENSIVE METABOLIC PANEL
ALT: 25 U/L (ref 0–35)
AST: 24 U/L (ref 0–37)
Albumin: 4.4 g/dL (ref 3.5–5.2)
Alkaline Phosphatase: 66 U/L (ref 39–117)
BUN: 24 mg/dL — ABNORMAL HIGH (ref 6–23)
CO2: 26 mEq/L (ref 19–32)
Calcium: 9.6 mg/dL (ref 8.4–10.5)
Chloride: 101 mEq/L (ref 96–112)
Creatinine, Ser: 0.79 mg/dL (ref 0.40–1.20)
GFR: 73.63 mL/min (ref >60.00)
Glucose, Bld: 106 mg/dL — ABNORMAL HIGH (ref 70–99)
Potassium: 4 mEq/L (ref 3.5–5.1)
Sodium: 136 mEq/L (ref 135–145)
Total Bilirubin: 1 mg/dL (ref 0.2–1.2)
Total Protein: 7.3 g/dL (ref 6.0–8.3)

## 2021-02-23 LAB — CBC WITH DIFFERENTIAL/PLATELET
Basophils Absolute: 0 10*3/uL (ref 0.0–0.1)
Basophils Relative: 0.5 % (ref 0.0–3.0)
Eosinophils Absolute: 0.2 10*3/uL (ref 0.0–0.7)
Eosinophils Relative: 2.4 % (ref 0.0–5.0)
HCT: 37.5 % (ref 36.0–46.0)
Hemoglobin: 12.8 g/dL (ref 12.0–15.0)
Lymphocytes Relative: 21.9 % (ref 12.0–46.0)
Lymphs Abs: 1.6 10*3/uL (ref 0.7–4.0)
MCHC: 34.2 g/dL (ref 30.0–36.0)
MCV: 92.2 fl (ref 78.0–100.0)
Monocytes Absolute: 0.7 10*3/uL (ref 0.1–1.0)
Monocytes Relative: 9.7 % (ref 3.0–12.0)
Neutro Abs: 4.8 10*3/uL (ref 1.4–7.7)
Neutrophils Relative %: 65.5 % (ref 43.0–77.0)
Platelets: 250 10*3/uL (ref 150.0–400.0)
RBC: 4.07 Mil/uL (ref 3.87–5.11)
RDW: 13.3 % (ref 11.5–15.5)
WBC: 7.3 10*3/uL (ref 4.0–10.5)

## 2021-02-23 LAB — PROTIME-INR
INR: 1 ratio (ref 0.8–1.0)
Prothrombin Time: 11.3 s (ref 9.6–13.1)

## 2021-02-23 MED ORDER — AMLODIPINE BESYLATE 5 MG PO TABS: 5.0000 mg | ORAL_TABLET | Freq: Every day | ORAL | 3 refills | Status: DC

## 2021-02-23 MED ORDER — IRBESARTAN 300 MG PO TABS: 300.0000 mg | ORAL_TABLET | Freq: Every day | ORAL | 3 refills | Status: DC

## 2021-02-23 NOTE — Progress Notes (Signed)
Subjective:  Patient ID: Alexandra Scott, female    DOB: 1946/04/20  Age: 75 y.o. MRN: [id number]  CC: Follow-up (3 MONTH F/U- Ref on irbesartan)   HPI Alexandra Scott presents for HTN, R breast d/c - surgery pending. MRI was ok. F/u on allergies  Outpatient Medications Prior to Visit  Medication Sig Dispense Refill  . aspirin 81 MG EC tablet Take 81 mg by mouth at bedtime.    Marland Kitchen BIOTIN PO Take 1 tablet by mouth at bedtime.    . Calcium Carbonate-Vitamin D3 600-400 MG-UNIT TABS Take 1 tablet by mouth 2 (two) times daily.     . Cholecalciferol (VITAMIN D3) 50 MCG (2000 UT) capsule Take 1 capsule (2,000 Units total) by mouth daily. 100 capsule 3  . Magnesium 250 MG TABS Take 250 mg by mouth daily.     . Multiple Vitamins-Minerals (TH COMPLETE MULTI 50+ PO) Take by mouth.    . mupirocin ointment (BACTROBAN) 2 % On leg wound w/dressing change qd or bid 30 g 0  . Omega-3 Fatty Acids (FISH OIL) 1000 MG CAPS Take 1,000 mg by mouth 2 (two) times daily. Reported on 01/11/2016    . Polyethyl Glycol-Propyl Glycol (SYSTANE OP) Place 1 drop into both eyes 2 (two) times daily as needed (dry eyes).    . Turmeric 500 MG TABS Take 500 mg by mouth 2 (two) times daily.     . vitamin C (ASCORBIC ACID) 500 MG tablet Take 500 mg by mouth at bedtime.     Marland Kitchen amLODipine (NORVASC) 5 MG tablet Take 1 tablet (5 mg total) by mouth daily. 90 tablet 0  . irbesartan (AVAPRO) 300 MG tablet Take 1 tablet (300 mg total) by mouth daily. 90 tablet 0  . furosemide (LASIX) 20 MG tablet Take 0.5-1 tablets (10-20 mg total) by mouth daily. (Patient not taking: No sig reported) 90 tablet 3  . loratadine (CLARITIN) 10 MG tablet Take 1 tablet (10 mg total) by mouth daily. (Patient not taking: Reported on 02/23/2021) 90 tablet 3   No facility-administered medications prior to visit.    ROS: Review of Systems  Constitutional: Negative for activity change, appetite change, chills, fatigue and unexpected weight change.  HENT:  Negative for congestion, mouth sores and sinus pressure.   Eyes: Negative for visual disturbance.  Respiratory: Negative for cough and chest tightness.   Gastrointestinal: Negative for abdominal pain and nausea.  Genitourinary: Negative for difficulty urinating, frequency and vaginal pain.  Musculoskeletal: Positive for arthralgias and gait problem. Negative for back pain.  Skin: Negative for pallor and rash.  Neurological: Negative for dizziness, tremors, weakness, numbness and headaches.  Psychiatric/Behavioral: Negative for confusion and sleep disturbance.    Objective:  BP (!) 142/80 (BP Location: Left Arm)   Pulse 81   Temp 98.3 F (36.8 C) (Oral)   Ht 5' (1.524 m)   Wt 159 lb 3.2 oz (72.2 kg)   SpO2 95%   BMI 31.09 kg/m   BP Readings from Last 3 Encounters:  02/23/21 (!) 142/80  12/23/20 102/63  11/23/20 136/82    Wt Readings from Last 3 Encounters:  02/23/21 159 lb 3.2 oz (72.2 kg)  12/23/20 157 lb (71.2 kg)  12/09/20 157 lb (71.2 kg)    Physical Exam Constitutional:      General: She is not in acute distress.    Appearance: She is well-developed. She is obese.  HENT:     Head: Normocephalic.     Right Ear: External  ear normal.     Left Ear: External ear normal.     Nose: Nose normal.  Eyes:     General:        Right eye: No discharge.        Left eye: No discharge.     Conjunctiva/sclera: Conjunctivae normal.     Pupils: Pupils are equal, round, and reactive to light.  Neck:     Thyroid: No thyromegaly.     Vascular: No JVD.     Trachea: No tracheal deviation.  Cardiovascular:     Rate and Rhythm: Normal rate and regular rhythm.     Heart sounds: Normal heart sounds.  Pulmonary:     Effort: No respiratory distress.     Breath sounds: No stridor. No wheezing.  Abdominal:     General: Bowel sounds are normal. There is no distension.     Palpations: Abdomen is soft. There is no mass.     Tenderness: There is no abdominal tenderness. There is no  guarding or rebound.  Musculoskeletal:        General: No tenderness.     Cervical back: Normal range of motion and neck supple.  Lymphadenopathy:     Cervical: No cervical adenopathy.  Skin:    Findings: No erythema or rash.  Neurological:     Mental Status: She is oriented to person, place, and time.     Cranial Nerves: No cranial nerve deficit.     Motor: No abnormal muscle tone.     Coordination: Coordination normal.     Deep Tendon Reflexes: Reflexes normal.  Psychiatric:        Behavior: Behavior normal.        Thought Content: Thought content normal.        Judgment: Judgment normal.     Lab Results  Component Value Date   WBC 8.0 11/23/2020   HGB 13.4 11/23/2020   HCT 39.1 11/23/2020   PLT 265.0 11/23/2020   GLUCOSE 104 (H) 11/23/2020   CHOL 147 02/07/2018   TRIG 70.0 02/07/2018   HDL 44.30 02/07/2018   LDLDIRECT 150.6 10/15/2007   LDLCALC 89 02/07/2018   ALT 22 11/23/2020   AST 21 11/23/2020   NA 136 11/23/2020   K 4.5 11/23/2020   CL 99 11/23/2020   CREATININE 0.76 11/23/2020   BUN 17 11/23/2020   CO2 28 11/23/2020   TSH 3.05 04/23/2020   INR 0.97 03/29/2011   HGBA1C 5.4 04/23/2020    MR BREAST BILATERAL W WO CONTRAST INC CAD  Result Date: 02/15/2021 CLINICAL DATA:  History of a breast excision for an intraductal papilloma. Patient believes that this was on the right side, but imaging and prior history indicates that this was on the left. LABS:  No labs drawn at time of imaging. EXAM: BILATERAL BREAST MRI WITH AND WITHOUT CONTRAST TECHNIQUE: Multiplanar, multisequence MR images of both breasts were obtained prior to and following the intravenous administration of 7 ml of Gadavist Three-dimensional MR images were rendered by post-processing of the original MR data on an independent workstation. The three-dimensional MR images were interpreted, and findings are reported in the following complete MRI report for this study. Three dimensional images were evaluated  at the independent interpreting workstation using the DynaCAD thin client. COMPARISON:  Prior exams including the previous breast MRI dated 08/27/2008. FINDINGS: Breast composition: b. Scattered fibroglandular tissue. Background parenchymal enhancement: Moderate. Right breast: No mass or abnormal enhancement. Left breast: No mass or abnormal enhancement. There is  architectural distortion consistent with postsurgical scarring in the retroareolar left breast. Lymph nodes: No abnormal appearing lymph nodes. Ancillary findings:  None. IMPRESSION: 1. No MRI evidence of breast malignancy. 2. Benign postsurgical scarring in the retroareolar left breast. RECOMMENDATION: Annual screening mammography. BI-RADS CATEGORY  2: Benign. Electronically Signed   By: Lajean Manes M.D.   On: 02/15/2021 13:34    Assessment & Plan:   There are no diagnoses linked to this encounter.   Meds ordered this encounter  Medications  . irbesartan (AVAPRO) 300 MG tablet    Sig: Take 1 tablet (300 mg total) by mouth daily.    Dispense:  90 tablet    Refill:  3  . amLODipine (NORVASC) 5 MG tablet    Sig: Take 1 tablet (5 mg total) by mouth daily.    Dispense:  90 tablet    Refill:  3     Follow-up: No follow-ups on file.  Walker Kehr, MD

## 2021-02-23 NOTE — Addendum Note (Signed)
Addended by: Earnstine Regal on: 02/23/2021 09:52 AM   Modules accepted: Orders

## 2021-02-23 NOTE — Assessment & Plan Note (Signed)
Xanax prn rare use  Potential benefits of a long term opioids use as well as potential risks (i.e. addiction risk, apnea etc) and complications (i.e. Somnolence, constipation and others) were explained to the patient and were aknowledged.

## 2021-02-23 NOTE — Addendum Note (Signed)
Addended by: Boris Lown B on: 02/23/2021 09:44 AM   Modules accepted: Orders

## 2021-02-23 NOTE — Assessment & Plan Note (Addendum)
Surgery is pending MRI was ok CBC, CMET, TNR

## 2021-02-23 NOTE — Assessment & Plan Note (Signed)
Cont w/Irbesartan, Amlodipine 5 mg

## 2021-03-12 ENCOUNTER — Encounter (HOSPITAL_BASED_OUTPATIENT_CLINIC_OR_DEPARTMENT_OTHER): Payer: Self-pay | Admitting: Surgery

## 2021-03-12 ENCOUNTER — Other Ambulatory Visit: Payer: Self-pay

## 2021-03-22 ENCOUNTER — Encounter (HOSPITAL_BASED_OUTPATIENT_CLINIC_OR_DEPARTMENT_OTHER)
Admission: RE | Admit: 2021-03-22 | Discharge: 2021-03-22 | Disposition: A | Discharge status: Home / Self Care | Payer: PPO | Source: Ambulatory Visit | Attending: Surgery | Admitting: Surgery

## 2021-03-22 DIAGNOSIS — Z01810 Encounter for preprocedural cardiovascular examination: Secondary | ICD-10-CM | POA: Diagnosis not present

## 2021-03-22 NOTE — Progress Notes (Signed)

## 2021-03-30 ENCOUNTER — Ambulatory Visit (HOSPITAL_BASED_OUTPATIENT_CLINIC_OR_DEPARTMENT_OTHER): Payer: PPO | Admitting: Anesthesiology

## 2021-03-30 ENCOUNTER — Ambulatory Visit (HOSPITAL_BASED_OUTPATIENT_CLINIC_OR_DEPARTMENT_OTHER)
Admission: RE | Admit: 2021-03-30 | Discharge: 2021-03-30 | Disposition: A | Discharge status: Home / Self Care | Payer: PPO | Attending: Surgery | Admitting: Surgery

## 2021-03-30 ENCOUNTER — Encounter (HOSPITAL_BASED_OUTPATIENT_CLINIC_OR_DEPARTMENT_OTHER)
Admission: RE | Disposition: A | Discharge status: Home / Self Care | Payer: Self-pay | Source: Home / Self Care | Attending: Surgery

## 2021-03-30 ENCOUNTER — Encounter (HOSPITAL_BASED_OUTPATIENT_CLINIC_OR_DEPARTMENT_OTHER): Payer: Self-pay | Admitting: Surgery

## 2021-03-30 ENCOUNTER — Other Ambulatory Visit: Payer: Self-pay

## 2021-03-30 DIAGNOSIS — N6452 Nipple discharge: Secondary | ICD-10-CM | POA: Insufficient documentation

## 2021-03-30 DIAGNOSIS — Z836 Family history of other diseases of the respiratory system: Secondary | ICD-10-CM | POA: Diagnosis not present

## 2021-03-30 DIAGNOSIS — Z888 Allergy status to other drugs, medicaments and biological substances status: Secondary | ICD-10-CM | POA: Insufficient documentation

## 2021-03-30 DIAGNOSIS — Z79899 Other long term (current) drug therapy: Secondary | ICD-10-CM | POA: Diagnosis not present

## 2021-03-30 DIAGNOSIS — K219 Gastro-esophageal reflux disease without esophagitis: Secondary | ICD-10-CM | POA: Diagnosis not present

## 2021-03-30 DIAGNOSIS — Z8249 Family history of ischemic heart disease and other diseases of the circulatory system: Secondary | ICD-10-CM | POA: Insufficient documentation

## 2021-03-30 DIAGNOSIS — Z803 Family history of malignant neoplasm of breast: Secondary | ICD-10-CM | POA: Insufficient documentation

## 2021-03-30 DIAGNOSIS — N6011 Diffuse cystic mastopathy of right breast: Secondary | ICD-10-CM | POA: Diagnosis not present

## 2021-03-30 DIAGNOSIS — Z8261 Family history of arthritis: Secondary | ICD-10-CM | POA: Insufficient documentation

## 2021-03-30 DIAGNOSIS — Z823 Family history of stroke: Secondary | ICD-10-CM | POA: Insufficient documentation

## 2021-03-30 DIAGNOSIS — Z792 Long term (current) use of antibiotics: Secondary | ICD-10-CM | POA: Diagnosis not present

## 2021-03-30 DIAGNOSIS — I10 Essential (primary) hypertension: Secondary | ICD-10-CM | POA: Diagnosis not present

## 2021-03-30 DIAGNOSIS — F419 Anxiety disorder, unspecified: Secondary | ICD-10-CM | POA: Diagnosis not present

## 2021-03-30 DIAGNOSIS — Z809 Family history of malignant neoplasm, unspecified: Secondary | ICD-10-CM | POA: Diagnosis not present

## 2021-03-30 HISTORY — PX: BREAST DUCTAL SYSTEM EXCISION: SHX5242

## 2021-03-30 SURGERY — EXCISION DUCTAL SYSTEM BREAST
Anesthesia: General | Site: Breast | Laterality: Right

## 2021-03-30 MED ORDER — FENTANYL CITRATE (PF) 100 MCG/2ML IJ SOLN: 25.0000 ug | INTRAMUSCULAR | Status: DC | PRN

## 2021-03-30 MED ORDER — CEFAZOLIN SODIUM-DEXTROSE 2-4 GM/100ML-% IV SOLN
INTRAVENOUS | Status: AC
  Filled 2021-03-30: qty 100

## 2021-03-30 MED ORDER — ACETAMINOPHEN 500 MG PO TABS
1000.0000 mg | ORAL_TABLET | ORAL | Status: AC
  Administered 2021-03-30: 1000 mg via ORAL

## 2021-03-30 MED ORDER — PROPOFOL 10 MG/ML IV BOLUS
INTRAVENOUS | Status: DC | PRN
  Administered 2021-03-30: 150 mg via INTRAVENOUS

## 2021-03-30 MED ORDER — OXYCODONE HCL 5 MG PO TABS: 5.0000 mg | ORAL_TABLET | Freq: Once | ORAL | Status: DC | PRN

## 2021-03-30 MED ORDER — METHYLENE BLUE 0.5 % INJ SOLN
INTRAVENOUS | Status: AC
  Filled 2021-03-30: qty 10

## 2021-03-30 MED ORDER — BUPIVACAINE HCL (PF) 0.25 % IJ SOLN
INTRAMUSCULAR | Status: AC
  Filled 2021-03-30: qty 30

## 2021-03-30 MED ORDER — ACETAMINOPHEN 500 MG PO TABS
ORAL_TABLET | ORAL | Status: AC
  Filled 2021-03-30: qty 2

## 2021-03-30 MED ORDER — LACTATED RINGERS IV SOLN: INTRAVENOUS | Status: DC

## 2021-03-30 MED ORDER — SODIUM CHLORIDE (PF) 0.9 % IJ SOLN
INTRAMUSCULAR | Status: AC
  Filled 2021-03-30: qty 10

## 2021-03-30 MED ORDER — BUPIVACAINE HCL 0.25 % IJ SOLN
INTRAMUSCULAR | Status: DC | PRN
  Administered 2021-03-30: 10 mL

## 2021-03-30 MED ORDER — PHENYLEPHRINE HCL (PRESSORS) 10 MG/ML IV SOLN
INTRAVENOUS | Status: DC | PRN
  Administered 2021-03-30: 40 ug via INTRAVENOUS
  Administered 2021-03-30: 80 ug via INTRAVENOUS
  Administered 2021-03-30 (×2): 40 ug via INTRAVENOUS

## 2021-03-30 MED ORDER — ONDANSETRON HCL 4 MG/2ML IJ SOLN
INTRAMUSCULAR | Status: DC | PRN
  Administered 2021-03-30: 4 mg via INTRAVENOUS

## 2021-03-30 MED ORDER — ONDANSETRON HCL 4 MG/2ML IJ SOLN: 4.0000 mg | Freq: Once | INTRAMUSCULAR | Status: DC | PRN

## 2021-03-30 MED ORDER — OXYCODONE HCL 5 MG/5ML PO SOLN
5.0000 mg | Freq: Once | ORAL | Status: DC | PRN
Start: 2021-03-30 — End: 2021-03-30

## 2021-03-30 MED ORDER — FENTANYL CITRATE (PF) 100 MCG/2ML IJ SOLN
INTRAMUSCULAR | Status: DC | PRN
  Administered 2021-03-30 (×2): 50 ug via INTRAVENOUS

## 2021-03-30 MED ORDER — LIDOCAINE HCL (CARDIAC) PF 100 MG/5ML IV SOSY
PREFILLED_SYRINGE | INTRAVENOUS | Status: DC | PRN
  Administered 2021-03-30: 60 mg via INTRAVENOUS

## 2021-03-30 MED ORDER — CHLORHEXIDINE GLUCONATE CLOTH 2 % EX PADS: 6.0000 | MEDICATED_PAD | Freq: Once | CUTANEOUS | Status: DC

## 2021-03-30 MED ORDER — ACETAMINOPHEN 500 MG PO TABS: 1000.0000 mg | ORAL_TABLET | Freq: Once | ORAL | Status: AC

## 2021-03-30 MED ORDER — PROPOFOL 500 MG/50ML IV EMUL
INTRAVENOUS | Status: DC | PRN
  Administered 2021-03-30: 25 ug/kg/min via INTRAVENOUS

## 2021-03-30 MED ORDER — LIDOCAINE HCL (PF) 1 % IJ SOLN
INTRAMUSCULAR | Status: AC
  Filled 2021-03-30: qty 30

## 2021-03-30 MED ORDER — CEFAZOLIN SODIUM-DEXTROSE 2-4 GM/100ML-% IV SOLN
2.0000 g | INTRAVENOUS | Status: AC
  Administered 2021-03-30: 2 g via INTRAVENOUS

## 2021-03-30 SURGICAL SUPPLY — 42 items
APL PRP STRL LF DISP 70% ISPRP (MISCELLANEOUS) ×1
APL SKNCLS STERI-STRIP NONHPOA (GAUZE/BANDAGES/DRESSINGS) ×1
APPLIER CLIP 9.375 MED OPEN (MISCELLANEOUS) ×3
APR CLP MED 9.3 20 MLT OPN (MISCELLANEOUS) ×1
BENZOIN TINCTURE PRP APPL 2/3 (GAUZE/BANDAGES/DRESSINGS) ×3 IMPLANT
BLADE HEX COATED 2.75 (ELECTRODE) ×3 IMPLANT
BLADE SURG 15 STRL LF DISP TIS (BLADE) ×1 IMPLANT
BLADE SURG 15 STRL SS (BLADE) ×3
CANISTER SUCT 1200ML W/VALVE (MISCELLANEOUS) ×3 IMPLANT
CHLORAPREP W/TINT 26 (MISCELLANEOUS) ×3 IMPLANT
CLIP APPLIE 9.375 MED OPEN (MISCELLANEOUS) ×1 IMPLANT
CLOSURE WOUND 1/2 X4 (GAUZE/BANDAGES/DRESSINGS) ×1
COVER BACK TABLE 60X90IN (DRAPES) ×3 IMPLANT
COVER MAYO STAND STRL (DRAPES) ×3 IMPLANT
DECANTER SPIKE VIAL GLASS SM (MISCELLANEOUS) IMPLANT
DRAPE LAPAROTOMY TRNSV 102X78 (DRAPES) ×3 IMPLANT
DRAPE UTILITY XL STRL (DRAPES) ×3 IMPLANT
DRSG TEGADERM 4X4.75 (GAUZE/BANDAGES/DRESSINGS) ×3 IMPLANT
ELECT REM PT RETURN 9FT ADLT (ELECTROSURGICAL) ×3
ELECTRODE REM PT RTRN 9FT ADLT (ELECTROSURGICAL) ×1 IMPLANT
GAUZE SPONGE 4X4 12PLY STRL LF (GAUZE/BANDAGES/DRESSINGS) ×3 IMPLANT
GLOVE SURG ENC MOIS LTX SZ7 (GLOVE) ×3 IMPLANT
GLOVE SURG UNDER POLY LF SZ7.5 (GLOVE) ×3 IMPLANT
GOWN STRL REUS W/ TWL LRG LVL3 (GOWN DISPOSABLE) ×2 IMPLANT
GOWN STRL REUS W/TWL LRG LVL3 (GOWN DISPOSABLE) ×6
NDL HYPO 25X1 1.5 SAFETY (NEEDLE) ×1 IMPLANT
NEEDLE HYPO 25X1 1.5 SAFETY (NEEDLE) ×3 IMPLANT
NS IRRIG 1000ML POUR BTL (IV SOLUTION) ×3 IMPLANT
PACK BASIN DAY SURGERY FS (CUSTOM PROCEDURE TRAY) ×3 IMPLANT
PENCIL SMOKE EVACUATOR (MISCELLANEOUS) ×3 IMPLANT
SLEEVE SCD COMPRESS KNEE MED (STOCKING) ×3 IMPLANT
SPONGE INTESTINAL PEANUT (DISPOSABLE) IMPLANT
SPONGE T-LAP 4X18 ~~LOC~~+RFID (SPONGE) ×3 IMPLANT
STRIP CLOSURE SKIN 1/2X4 (GAUZE/BANDAGES/DRESSINGS) ×2 IMPLANT
SUT MNCRL AB 4-0 PS2 18 (SUTURE) ×3 IMPLANT
SUT SILK 0 TIES 10X30 (SUTURE) IMPLANT
SUT VIC AB 3-0 SH 27 (SUTURE) ×3
SUT VIC AB 3-0 SH 27X BRD (SUTURE) ×1 IMPLANT
SYR CONTROL 10ML LL (SYRINGE) ×3 IMPLANT
TUBE CONNECTING 20'X1/4 (TUBING) ×1
TUBE CONNECTING 20X1/4 (TUBING) ×2 IMPLANT
YANKAUER SUCT BULB TIP NO VENT (SUCTIONS) ×3 IMPLANT

## 2021-03-30 NOTE — H&P (Signed)
History of Present Illness  The patient is a 75 year old female who presents with a complaint of nipple discharge. Referred by Dr. Stann Mainland for right nipple discharge.   This is a 75 year old female who is status post left nipple duct excision in August 2015 for nipple discharge.  I excised the central nipple along with a cone of breast tissue.  Pathology revealed an intraductal papilloma.  The patient has been doing well with her annual mammograms.  3 months ago she began having persistent yellowish drainage coming from her right nipple.  She had an MRI earlier in the week that was unremarkable.  She is having a bit of tenderness right at the nipple.  She presents now to discuss possible surgical intervention.   CLINICAL DATA:  History of a breast excision for an intraductal papilloma. Patient believes that this was on the right side, but imaging and prior history indicates that this was on the left.   LABS:  No labs drawn at time of imaging.   EXAM: BILATERAL BREAST MRI WITH AND WITHOUT CONTRAST   TECHNIQUE: Multiplanar, multisequence MR images of both breasts were obtained prior to and following the intravenous administration of 7 ml of Gadavist   Three-dimensional MR images were rendered by post-processing of the original MR data on an independent workstation. The three-dimensional MR images were interpreted, and findings are reported in the following complete MRI report for this study. Three dimensional images were evaluated at the independent interpreting workstation using the DynaCAD thin client.   COMPARISON:  Prior exams including the previous breast MRI dated 08/27/2008.   FINDINGS: Breast composition: b. Scattered fibroglandular tissue.   Background parenchymal enhancement: Moderate.   Right breast: No mass or abnormal enhancement.   Left breast: No mass or abnormal enhancement. There is architectural distortion consistent with postsurgical scarring in the  retroareolar left breast.   Lymph nodes: No abnormal appearing lymph nodes.   Ancillary findings:  None.   IMPRESSION: 1. No MRI evidence of breast malignancy. 2. Benign postsurgical scarring in the retroareolar left breast.   RECOMMENDATION: Annual screening mammography.   BI-RADS CATEGORY  2: Benign.     Electronically Signed  By: Lajean Manes M.D.  On: 02/15/2021 13:34     Problem List/Past Medical  INTRADUCTAL PAPILLOMA OF BREAST, LEFT (D24.2)  DISCHARGE FROM RIGHT NIPPLE (F81.01)    Past Surgical History  Colon Polyp Removal - Colonoscopy  Hip Surgery  Right.   Diagnostic Studies History  Colonoscopy  1-5 years ago Mammogram  within last year Pap Smear  1-5 years ago   Allergies  Corticosteroids  Allergies Reconciled    Medication History  Amoxicillin  (500MG  Capsule, Oral) Active. Irbesartan  (300MG  Tablet, Oral) Active. Mupirocin  (2% Ointment, External) Active. Ofloxacin  (0.3% Solution, Ophthalmic) Active. amLODIPine Besylate-Valsartan  (10-160MG  Tablet, Oral) Active. Benazepril HCl  (40MG  Tablet, Oral daily) Active. Triamterene-HCTZ  (37.5-25MG  Tablet, Oral four times daily, as needed) Active. Medications Reconciled    Social History  Caffeine use  Coffee, Tea. No alcohol use  No drug use  Tobacco use  Never smoker.   Family History  Arthritis  Mother. Breast Cancer  Family Members In General, Mother. Cancer  Brother. Cerebrovascular Accident  Mother. Heart Disease  Father. Heart disease in female family member before age 59  Hypertension  Brother, Mother. Respiratory Condition  Father.   Pregnancy / Birth History  Age at menarche  27 years. Age of menopause  8-50 Contraceptive History  Oral contraceptives.  Gravida  2 Maternal age  40-30 Para  2 Regular periods    Other Problems Arthritis  Diverticulosis  High blood pressure  Lump In Breast        Vitals  Weight: 161 lb   Height: 59 in  Body Surface Area: 1.68 m   Body  Mass Index: 32.52 kg/m   Temp.: 97.5 F    Pulse: 96 (Regular)    P.OX: 100% (Room air) BP: 136/70(Sitting, Left Arm, Standard)               Physical Exam    The physical exam findings are as follows: Note: Constitutional: WDWN in NAD, conversant, no obvious deformities; resting comfortably Eyes: Pupils equal, round; sclera anicteric; moist conjunctiva; no lid lag HENT: Oral mucosa moist; good dentition Neck: No masses palpated, trachea midline; no thyromegaly Lungs: CTA bilaterally; normal respiratory effort CV: Regular rate and rhythm; no murmurs; extremities well-perfused with no edema Breasts: symmetric; no nipple retraction; well-healed left circumareolar incision; no left breast masses or lymphadenopathy Right central nipple shows an enlarged duct opening. Yellowish fluid is easily expressed. Mild tenderness in the retroareolar region. No other breast masses palpated. No lymphadenopathy Abd: +bowel sounds, soft, non-tender, no palpable organomegaly; no palpable hernias Musc: Normal gait; no apparent clubbing or cyanosis in extremities Lymphatic: No palpable cervical or axillary lymphadenopathy Skin: Warm, dry; no sign of jaundice Psychiatric - alert and oriented x 4; calm mood and affect       Assessment & Plan   DISCHARGE FROM RIGHT NIPPLE [phone])   Current Plans Schedule for Surgery - Right nipple duct excision.  The surgical procedure has been discussed with the patient.  Potential risks, benefits, alternative treatments, and expected outcomes have been explained.  All of the patient's questions at this time have been answered.  The likelihood of reaching the patient's treatment goal is good.  The patient understand the proposed surgical procedure and wishes to proceed.   Imogene Burn. Georgette Dover, MD, [facility]  General Surgery   03/30/2021 7:42 AM

## 2021-03-30 NOTE — Anesthesia Postprocedure Evaluation (Signed)
Anesthesia Post Note  Patient: Alexandra Scott  Procedure(s) Performed: RIGHT NIPPLE DUCT EXCISION (Right: Breast)     Patient location during evaluation: PACU Anesthesia Type: General Level of consciousness: awake and alert Pain management: pain level controlled Vital Signs Assessment: post-procedure vital signs reviewed and stable Respiratory status: spontaneous breathing, nonlabored ventilation and respiratory function stable Cardiovascular status: blood pressure returned to baseline and stable Postop Assessment: no apparent nausea or vomiting Anesthetic complications: no   No notable events documented.  Last Vitals:  Vitals:   03/30/21 1130 03/30/21 1145  BP: (!) 103/56 114/67  Pulse: 83 95  Resp: 13 (!) 24  Temp:    SpO2: 98% 95%    Last Pain:  Vitals:   03/30/21 1130  TempSrc:   PainSc: Asleep                 Merlinda Frederick

## 2021-03-30 NOTE — Transfer of Care (Signed)
Immediate Anesthesia Transfer of Care Note  Patient: Alexandra Scott  Procedure(s) Performed: RIGHT NIPPLE DUCT EXCISION (Right: Breast)  Patient Location: PACU  Anesthesia Type:General  Level of Consciousness: drowsy and patient cooperative  Airway & Oxygen Therapy: Patient Spontanous Breathing and Patient connected to face mask oxygen  Post-op Assessment: Report given to RN and Post -op Vital signs reviewed and stable  Post vital signs: Reviewed and stable  Last Vitals:  Vitals Value Taken Time  BP    Temp    Pulse 86 03/30/21 1109  Resp    SpO2 97 % 03/30/21 1109  Vitals shown include unvalidated device data.  Last Pain:  Vitals:   03/30/21 0920  TempSrc: Oral  PainSc: 0-No pain      Patients Stated Pain Goal: 1 (30/86/57 8469)  Complications: No notable events documented.

## 2021-03-30 NOTE — Anesthesia Preprocedure Evaluation (Addendum)
Anesthesia Evaluation  Patient identified by MRN, date of birth, ID band Patient awake    Reviewed: Allergy & Precautions, NPO status , Patient's Chart, lab work & pertinent test results  Airway Mallampati: II  TM Distance: >3 FB Neck ROM: Full    Dental no notable dental hx.    Pulmonary neg pulmonary ROS,    Pulmonary exam normal breath sounds clear to auscultation       Cardiovascular hypertension, negative cardio ROS Normal cardiovascular exam Rhythm:Regular Rate:Normal  Normal sinus rhythm Low voltage QRS Poor anterior R wave progressoin No significant change since last tracing Confirmed by Minus Breeding [phone]) on 03/22/2021 9:59:10 AM   Neuro/Psych PSYCHIATRIC DISORDERS Anxiety negative neurological ROS  negative psych ROS   GI/Hepatic negative GI ROS, Neg liver ROS, GERD  ,  Endo/Other  negative endocrine ROS  Renal/GU negative Renal ROS  negative genitourinary   Musculoskeletal negative musculoskeletal ROS (+) Arthritis , Osteoarthritis,    Abdominal   Peds negative pediatric ROS (+)  Hematology negative hematology ROS (+) anemia ,   Anesthesia Other Findings   Reproductive/Obstetrics negative OB ROS                            Anesthesia Physical Anesthesia Plan  ASA: 2  Anesthesia Plan: General   Post-op Pain Management:    Induction: Intravenous  PONV Risk Score and Plan: 3 and Treatment may vary due to age or medical condition, Ondansetron and Dexamethasone  Airway Management Planned: LMA  Additional Equipment: None  Intra-op Plan:   Post-operative Plan: Extubation in OR  Informed Consent: I have reviewed the patients History and Physical, chart, labs and discussed the procedure including the risks, benefits and alternatives for the proposed anesthesia with the patient or authorized representative who has indicated his/her understanding and acceptance.      Dental advisory given  Plan Discussed with: Anesthesiologist and CRNA  Anesthesia Plan Comments:        Anesthesia Quick Evaluation

## 2021-03-30 NOTE — Discharge Instructions (Addendum)
[place] Office Phone Number [phone]  BREAST BIOPSY/ PARTIAL MASTECTOMY: POST OP INSTRUCTIONS  Always review your discharge instruction sheet given to you by the facility where your surgery was performed.  IF YOU HAVE DISABILITY OR FAMILY LEAVE FORMS, YOU MUST BRING THEM TO THE OFFICE FOR PROCESSING.  DO NOT GIVE THEM TO YOUR DOCTOR.  A prescription for pain medication may be given to you upon discharge.  Take your pain medication as prescribed, if needed.  If narcotic pain medicine is not needed, then you may take acetaminophen (Tylenol) or ibuprofen (Advil) as needed. Take your usually prescribed medications unless otherwise directed If you need a refill on your pain medication, please contact your pharmacy.  They will contact our office to request authorization.  Prescriptions will not be filled after 5pm or on week-ends. You should eat very light the first 24 hours after surgery, such as soup, crackers, pudding, etc.  Resume your normal diet the day after surgery. Most patients will experience some swelling and bruising in the breast.  Ice packs and a good support bra will help.  Swelling and bruising can take several days to resolve.  It is common to experience some constipation if taking pain medication after surgery.  Increasing fluid intake and taking a stool softener will usually help or prevent this problem from occurring.  A mild laxative (Milk of Magnesia or Miralax) should be taken according to package directions if there are no bowel movements after 48 hours. Unless discharge instructions indicate otherwise, you may remove your bandages 24-48 hours after surgery, and you may shower at that time.  You may have steri-strips (small skin tapes) in place directly over the incision.  These strips should be left on the skin for 7-10 days.  If your surgeon used skin glue on the incision, you may shower in 24 hours.  The glue will flake off over the next 2-3 weeks.  Any  sutures or staples will be removed at the office during your follow-up visit. ACTIVITIES:  You may resume regular daily activities (gradually increasing) beginning the next day.  Wearing a good support bra or sports bra minimizes pain and swelling.  You may have sexual intercourse when it is comfortable. You may drive when you no longer are taking prescription pain medication, you can comfortably wear a seatbelt, and you can safely maneuver your car and apply brakes. RETURN TO WORK:  ______________________________________________________________________________________ Dennis Bast should see your doctor in the office for a follow-up appointment approximately two weeks after your surgery.  Your doctor's nurse will typically make your follow-up appointment when she calls you with your pathology report.  Expect your pathology report 2-3 business days after your surgery.  You may call to check if you do not hear from [place] after three days. OTHER INSTRUCTIONS: _______________________________________________________________________________________________ _____________________________________________________________________________________________________________________________________ _____________________________________________________________________________________________________________________________________ _____________________________________________________________________________________________________________________________________  WHEN TO CALL YOUR DOCTOR: Fever over 101.0 Nausea and/or vomiting. Extreme swelling or bruising. Continued bleeding from incision. Increased pain, redness, or drainage from the incision.  The clinic staff is available to answer your questions during regular business hours.  Please don't hesitate to call and ask to speak to one of the nurses for clinical concerns.  If you have a medical emergency, go to the nearest emergency room or call 911.  A surgeon from [facility] is always on call at the hospital.  For further questions, please visit [email or web address]   No Tylenol before 3:30pm if needed.  Post Anesthesia Home Care Instructions  Activity: Get plenty  of rest for the remainder of the day. A responsible individual must stay with you for 24 hours following the procedure.  For the next 24 hours, DO NOT: -Drive a car -Paediatric nurse -Drink alcoholic beverages -Take any medication unless instructed by your physician -Make any legal decisions or sign important papers.  Meals: Start with liquid foods such as gelatin or soup. Progress to regular foods as tolerated. Avoid greasy, spicy, heavy foods. If nausea and/or vomiting occur, drink only clear liquids until the nausea and/or vomiting subsides. Call your physician if vomiting continues.  Special Instructions/Symptoms: Your throat may feel dry or sore from the anesthesia or the breathing tube placed in your throat during surgery. If this causes discomfort, gargle with warm salt water. The discomfort should disappear within 24 hours.  If you had a scopolamine patch placed behind your ear for the management of post- operative nausea and/or vomiting:  1. The medication in the patch is effective for 72 hours, after which it should be removed.  Wrap patch in a tissue and discard in the trash. Wash hands thoroughly with soap and water. 2. You may remove the patch earlier than 72 hours if you experience unpleasant side effects which may include dry mouth, dizziness or visual disturbances. 3. Avoid touching the patch. Wash your hands with soap and water after contact with the patch.

## 2021-03-30 NOTE — Op Note (Signed)
Preop diagnosis: Right nipple discharge Postop diagnosis: Same Procedure performed: Right nipple duct excision Surgeon:Amana Bouska K Ardith Test Resident:  Dr. Lucas Mallow I was personally present during the key and critical portions of this procedure and immediately available throughout the entire procedure, as documented in my operative note. Anesthesia: General Indications: This is a 75 year old female who is status post left nipple duct excision 8 years ago for nipple discharge.  Excision revealed an intraductal papilloma.  Several months ago she began having persistent yellowish drainage coming from the right nipple.  MRI was unremarkable.  She presents now for nipple duct excision.  Description of procedure: The patient is brought to the operating room and placed in the supine position on the operating room table.  After an adequate level of general anesthesia was obtained, the right breast was prepped with ChloraPrep and draped in sterile fashion.  A timeout was taken to ensure the proper patient and proper procedure.  We examined the nipple.  We were able to express some clear yellow discharge.  The nipple duct was cannulated with a lacrimal duct probe.  We then made a circumareolar incision around the lower edge of the nipple.  We dissected posteriorly behind the nipple until we encountered the probe within the duct.  We grasped the duct and remove the probe.  We used this as the apex of our specimen.  We excised an inverted cone shaped specimen about 2 cm tall.  We sent this for pathologic examination.  We inspected carefully for hemostasis.  The wound was closed with 3-0 Vicryl 4-0 Monocryl.  Benzoin and Steri-Strips were applied.  The patient was then extubated and brought to the recovery room in stable condition.  All sponge, instrument, and needle counts are correct.  Imogene Burn. Georgette Dover, MD, [facility] Surgery  General Surgery   03/30/2021 11:02 AM

## 2021-03-30 NOTE — Anesthesia Procedure Notes (Signed)
Procedure Name: LMA Insertion Date/Time: 03/30/2021 10:27 AM Performed by: Signe Colt, CRNA Pre-anesthesia Checklist: Patient identified, Emergency Drugs available, Suction available and Patient being monitored Patient Re-evaluated:Patient Re-evaluated prior to induction Oxygen Delivery Method: Circle System Utilized Preoxygenation: Pre-oxygenation with 100% oxygen Induction Type: IV induction Ventilation: Mask ventilation without difficulty LMA: LMA inserted LMA Size: 4.0 Number of attempts: 1 Airway Equipment and Method: bite block Placement Confirmation: positive ETCO2 Tube secured with: Tape Dental Injury: Teeth and Oropharynx as per pre-operative assessment

## 2021-03-31 ENCOUNTER — Encounter (HOSPITAL_BASED_OUTPATIENT_CLINIC_OR_DEPARTMENT_OTHER): Payer: Self-pay | Admitting: Surgery

## 2021-03-31 LAB — SURGICAL PATHOLOGY

## 2021-06-01 DIAGNOSIS — Z1283 Encounter for screening for malignant neoplasm of skin: Secondary | ICD-10-CM | POA: Diagnosis not present

## 2021-06-01 DIAGNOSIS — B078 Other viral warts: Secondary | ICD-10-CM | POA: Diagnosis not present

## 2021-06-01 DIAGNOSIS — L570 Actinic keratosis: Secondary | ICD-10-CM | POA: Diagnosis not present

## 2021-06-01 DIAGNOSIS — X32XXXA Exposure to sunlight, initial encounter: Secondary | ICD-10-CM | POA: Diagnosis not present

## 2021-06-01 DIAGNOSIS — D3612 Benign neoplasm of peripheral nerves and autonomic nervous system, upper limb, including shoulder: Secondary | ICD-10-CM | POA: Diagnosis not present

## 2021-06-01 DIAGNOSIS — D225 Melanocytic nevi of trunk: Secondary | ICD-10-CM | POA: Diagnosis not present

## 2021-06-01 DIAGNOSIS — D485 Neoplasm of uncertain behavior of skin: Secondary | ICD-10-CM | POA: Diagnosis not present

## 2021-06-03 DIAGNOSIS — Z6832 Body mass index (BMI) 32.0-32.9, adult: Secondary | ICD-10-CM | POA: Diagnosis not present

## 2021-06-03 DIAGNOSIS — Z01419 Encounter for gynecological examination (general) (routine) without abnormal findings: Secondary | ICD-10-CM | POA: Diagnosis not present

## 2021-06-03 DIAGNOSIS — R309 Painful micturition, unspecified: Secondary | ICD-10-CM | POA: Diagnosis not present

## 2021-06-03 DIAGNOSIS — Z124 Encounter for screening for malignant neoplasm of cervix: Secondary | ICD-10-CM | POA: Diagnosis not present

## 2021-06-09 LAB — HM PAP SMEAR: HM Pap smear: NEGATIVE

## 2021-06-14 ENCOUNTER — Encounter: Payer: Self-pay | Admitting: Internal Medicine

## 2021-06-17 DIAGNOSIS — H5703 Miosis: Secondary | ICD-10-CM | POA: Diagnosis not present

## 2021-06-17 DIAGNOSIS — H0102B Squamous blepharitis left eye, upper and lower eyelids: Secondary | ICD-10-CM | POA: Diagnosis not present

## 2021-06-17 DIAGNOSIS — H02834 Dermatochalasis of left upper eyelid: Secondary | ICD-10-CM | POA: Diagnosis not present

## 2021-06-17 DIAGNOSIS — H11153 Pinguecula, bilateral: Secondary | ICD-10-CM | POA: Diagnosis not present

## 2021-06-17 DIAGNOSIS — H02831 Dermatochalasis of right upper eyelid: Secondary | ICD-10-CM | POA: Diagnosis not present

## 2021-06-17 DIAGNOSIS — H25813 Combined forms of age-related cataract, bilateral: Secondary | ICD-10-CM | POA: Diagnosis not present

## 2021-06-17 DIAGNOSIS — H04123 Dry eye syndrome of bilateral lacrimal glands: Secondary | ICD-10-CM | POA: Diagnosis not present

## 2021-06-17 DIAGNOSIS — H02055 Trichiasis without entropian left lower eyelid: Secondary | ICD-10-CM | POA: Diagnosis not present

## 2021-06-17 DIAGNOSIS — H0102A Squamous blepharitis right eye, upper and lower eyelids: Secondary | ICD-10-CM | POA: Diagnosis not present

## 2021-07-14 DIAGNOSIS — M25511 Pain in right shoulder: Secondary | ICD-10-CM | POA: Diagnosis not present

## 2021-07-14 DIAGNOSIS — M25811 Other specified joint disorders, right shoulder: Secondary | ICD-10-CM | POA: Diagnosis not present

## 2021-08-12 ENCOUNTER — Telehealth: Payer: Self-pay | Admitting: Internal Medicine

## 2021-08-12 NOTE — Telephone Encounter (Signed)
Returning a call from patient, unable to reach her. So I have left a voicemail for her to call me back in regards to message she left yesterday about her AWV appointment.

## 2021-08-16 ENCOUNTER — Ambulatory Visit: Payer: PPO

## 2021-08-17 ENCOUNTER — Ambulatory Visit (INDEPENDENT_AMBULATORY_CARE_PROVIDER_SITE_OTHER): Payer: PPO

## 2021-08-17 DIAGNOSIS — Z0000 Encounter for general adult medical examination without abnormal findings: Secondary | ICD-10-CM

## 2021-08-17 NOTE — Progress Notes (Addendum)
I connected with Alexandra Scott today by telephone and verified that I am speaking with the correct person using two identifiers. Location patient: home Location provider: work Persons participating in the virtual visit: patient, provider.   I discussed the limitations, risks, security and privacy concerns of performing an evaluation and management service by telephone and the availability of in person appointments. I also discussed with the patient that there may be a patient responsible charge related to this service. The patient expressed understanding and verbally consented to this telephonic visit.    Interactive audio and video telecommunications were attempted between this provider and patient, however failed, due to patient having technical difficulties OR patient did not have access to video capability.  We continued and completed visit with audio only.  Some vital signs may be absent or patient reported.   Time Spent with patient on telephone encounter: 40 minutes  Subjective:   Alexandra Scott is a 75 y.o. female who presents for Medicare Annual (Subsequent) preventive examination.  Review of Systems     Cardiac Risk Factors include: advanced age (>60men, >50 women);dyslipidemia;family history of premature cardiovascular disease;hypertension     Objective:    There were no vitals filed for this visit. There is no height or weight on file to calculate BMI.  Advanced Directives 08/17/2021 03/30/2021 03/12/2021 04/23/2020 10/08/2018 02/12/2018 02/06/2017  Does Patient Have a Medical Advance Directive? No No No No No No No  Does patient want to make changes to medical advance directive? No - Patient declined - - - - - -  Would patient like information on creating a medical advance directive? - No - Patient declined No - Patient declined No - Patient declined Yes (ED - Information included in AVS) No - Patient declined Yes (ED - Information included in AVS)    Current Medications  (verified) Outpatient Encounter Medications as of 08/17/2021  Medication Sig   amLODipine (NORVASC) 5 MG tablet Take 1 tablet (5 mg total) by mouth daily.   aspirin 81 MG EC tablet Take 81 mg by mouth at bedtime.   BIOTIN PO Take 1 tablet by mouth at bedtime.   Calcium Carbonate-Vitamin D3 600-400 MG-UNIT TABS Take 1 tablet by mouth 2 (two) times daily.    Cholecalciferol (VITAMIN D3) 50 MCG (2000 UT) capsule Take 1 capsule (2,000 Units total) by mouth daily.   irbesartan (AVAPRO) 300 MG tablet Take 1 tablet (300 mg total) by mouth daily.   Magnesium 250 MG TABS Take 250 mg by mouth daily.    Multiple Vitamins-Minerals (TH COMPLETE MULTI 50+ PO) Take by mouth.   Omega-3 Fatty Acids (FISH OIL) 1000 MG CAPS Take 1,000 mg by mouth 2 (two) times daily. Reported on 01/11/2016   Polyethyl Glycol-Propyl Glycol (SYSTANE OP) Place 1 drop into both eyes 2 (two) times daily as needed (dry eyes).   Turmeric 500 MG TABS Take 500 mg by mouth 2 (two) times daily.    vitamin C (ASCORBIC ACID) 500 MG tablet Take 500 mg by mouth at bedtime.    No facility-administered encounter medications on file as of 08/17/2021.    Allergies (verified) Augmentin [amoxicillin-pot clavulanate], Cortisone, Hydrocortisone, Maxzide [hydrochlorothiazide w-triamterene], and Prednisone   History: Past Medical History:  Diagnosis Date   Breast cyst 2008   Left / SER   Colon polyps    Constipation    GERD (gastroesophageal reflux disease)    uses OTC   HTN (hypertension)    LBP (low back pain)    ?  Spinal stenosis   Osteoarthritis    Osteoarthritis of hip 2011   Right/Dr Fields   Past Surgical History:  Procedure Laterality Date   BREAST DUCTAL SYSTEM EXCISION Left 05/22/2014   Procedure: LEFT NIPPLE DUCT EXCISION;  Surgeon: Imogene Burn. Georgette Dover, MD;  Location: [place];  Service: General;  Laterality: Left;   BREAST DUCTAL SYSTEM EXCISION Right 03/30/2021   Procedure: RIGHT NIPPLE DUCT EXCISION;  Surgeon: Donnie Mesa, MD;   Location: [place];  Service: General;  Laterality: Right;   COLONOSCOPY     COLONOSCOPY W/ BIOPSIES AND POLYPECTOMY     JOINT REPLACEMENT  7/12   R THR   POLYPECTOMY     Family History  Problem Relation Age of Onset   Colon cancer Maternal Uncle    Hypertension Other    Breast cancer Other    Esophageal cancer Neg Hx    Rectal cancer Neg Hx    Stomach cancer Neg Hx    Social History   Socioeconomic History   Marital status: Married    Spouse name: Not on file   Number of children: Not on file   Years of education: Not on file   Highest education level: Not on file  Occupational History   Occupation: Museum/gallery conservator: self employed  Tobacco Use   Smoking status: Never   Smokeless tobacco: Never  Vaping Use   Vaping Use: Never used  Substance and Sexual Activity   Alcohol use: No    Alcohol/week: 0.0 standard drinks   Drug use: No   Sexual activity: Not Currently    Birth control/protection: Post-menopausal  Other Topics Concern   Not on file  Social History Narrative   Not on file   Social Determinants of Health   Financial Resource Strain: Low Risk    Difficulty of Paying Living Expenses: Not hard at all  Food Insecurity: No Food Insecurity   Worried About Charity fundraiser in the Last Year: Never true   [place] in the Last Year: Never true  Transportation Needs: No Transportation Needs   Lack of Transportation (Medical): No   Lack of Transportation (Non-Medical): No  Physical Activity: Sufficiently Active   Days of Exercise per Week: 5 days   Minutes of Exercise per Session: 30 min  Stress: No Stress Concern Present   Feeling of Stress : Not at all  Social Connections: Socially Integrated   Frequency of Communication with Friends and Family: More than three times a week   Frequency of Social Gatherings with Friends and Family: More than three times a week   Attends Religious Services: More than 4 times per year    Active Member of [employer] or Organizations: Yes   Attends Music therapist: More than 4 times per year   Marital Status: Married    Tobacco Counseling Counseling given: Not Answered   Clinical Intake:  Pre-visit preparation completed: Yes  Pain : No/denies pain     Nutritional Risks: None Diabetes: No  How often do you need to have someone help you when you read instructions, pamphlets, or other written materials from your doctor or pharmacy?: 1 - Never What is the last grade level you completed in school?: HSG  Diabetic? no  Interpreter Needed?: No  Information entered by :: Lisette Abu, LPN   Activities of Daily Living In your present state of health, do you have any difficulty performing the following activities: 08/17/2021 03/30/2021  Hearing? N N  Vision? N N  Difficulty concentrating or making decisions? N N  Walking or climbing stairs? N Y  Dressing or bathing? N N  Doing errands, shopping? N -  Preparing Food and eating ? N -  Using the Toilet? N -  In the past six months, have you accidently leaked urine? N -  Do you have problems with loss of bowel control? N -  Managing your Medications? N -  Managing your Finances? N -  Housekeeping or managing your Housekeeping? N -  Some recent data might be hidden    Patient Care Team: Plotnikov, Evie Lacks, MD as PCP - General Barbaraann Rondo, MD (Obstetrics and Gynecology) Ladene Artist, MD (Gastroenterology) Gaynelle Arabian, MD (Orthopedic Surgery) Donnie Mesa, MD as Consulting Physician (General Surgery)  Indicate any recent Medical Services you may have received from other than [facility] providers in the past year (date may be approximate).     Assessment:   This is a routine wellness examination for Hayley.  Hearing/Vision screen Hearing Screening - Comments:: Patient denied any hearing difficulty.   No hearing aids.  Vision Screening - Comments:: Patient wears corrective  glasses/contacts.  Eye exam done annually by: Dr. Clent Jacks.  Dietary issues and exercise activities discussed: Current Exercise Habits: Home exercise routine, Type of exercise: Other - see comments (leg exercises twice a day), Time (Minutes): 30, Frequency (Times/Week): 5, Weekly Exercise (Minutes/Week): 150, Intensity: Mild, Exercise limited by: None identified   Goals Addressed   None   Depression Screen PHQ 2/9 Scores 08/17/2021 04/23/2020 08/14/2019 02/12/2018 02/06/2017 06/30/2015  PHQ - 2 Score 0 0 1 1 0 0  PHQ- 9 Score - - - 2 - -    Fall Risk Fall Risk  08/17/2021 04/23/2020 05/22/2019 02/12/2018 02/06/2017  Falls in the past year? 0 0 1 Yes No  Number falls in past yr: 0 0 1 1 -  Injury with Fall? 0 0 1 No -  Risk for fall due to : No Fall Risks Impaired balance/gait Orthopedic patient - -  Follow up Falls evaluation completed Falls evaluation completed - Falls prevention discussed -    FALL RISK PREVENTION PERTAINING TO THE HOME:  Any stairs in or around the home? No  If so, are there any without handrails? No  Home free of loose throw rugs in walkways, pet beds, electrical cords, etc? Yes  Adequate lighting in your home to reduce risk of falls? Yes   ASSISTIVE DEVICES UTILIZED TO PREVENT FALLS:  Life alert? No  Use of a cane, walker or w/c? Yes (use on uneven ground/pavement) Grab bars in the bathroom? No  Shower chair or bench in shower? Yes  Elevated toilet seat or a handicapped toilet? Yes   TIMED UP AND GO:  Was the test performed? No .  Length of time to ambulate 10 feet: n/a sec.   Gait steady and fast without use of assistive device  Cognitive Function: Normal cognitive status assessed by direct observation by this Nurse Health Advisor. No abnormalities found.       6CIT Screen 04/23/2020  What Year? 0 points  What month? 0 points  What time? 0 points  Count back from 20 0 points  Months in reverse 0 points  Repeat phrase 0 points  Total Score 0     Immunizations Immunization History  Administered Date(s) Administered   Fluad Quad(high Dose 65+) 05/22/2019, 08/14/2020, 07/20/2021   Influenza,inj,Quad PF,6+ Mos 06/30/2014, 06/30/2015  Influenza-Unspecified 06/27/2015   Pneumococcal Conjugate-13 01/11/2016   Pneumococcal Polysaccharide-23 02/06/2018   Td 11/19/2010   Tdap 02/23/2021    TDAP status: Up to date  Flu Vaccine status: Up to date  Pneumococcal vaccine status: Up to date  Covid-19 vaccine status: Declined, Education has been provided regarding the importance of this vaccine but patient still declined. Advised may receive this vaccine at local pharmacy or Health Dept.or vaccine clinic. Aware to provide a copy of the vaccination record if obtained from local pharmacy or Health Dept. Verbalized acceptance and understanding.  Qualifies for Shingles Vaccine? Yes   Zostavax completed No   Shingrix Completed?: No.    Education has been provided regarding the importance of this vaccine. Patient has been advised to call insurance company to determine out of pocket expense if they have not yet received this vaccine. Advised may also receive vaccine at local pharmacy or Health Dept. Verbalized acceptance and understanding.  Screening Tests Health Maintenance  Topic Date Due   Hepatitis C Screening  Never done   Zoster Vaccines- Shingrix (1 of 2) Never done   TETANUS/TDAP  02/24/2031   Pneumonia Vaccine 27+ Years old  Completed   INFLUENZA VACCINE  Completed   DEXA SCAN  Completed   HPV VACCINES  Aged Out   COLONOSCOPY (Pts 45-44yrs Insurance coverage will need to be confirmed)  Discontinued   COVID-19 Vaccine  Discontinued    Health Maintenance  Health Maintenance Due  Topic Date Due   Hepatitis C Screening  Never done   Zoster Vaccines- Shingrix (1 of 2) Never done    Colorectal cancer screening: Type of screening: Colonoscopy. Completed 12/23/2020. Repeat every 0 years  Mammogram status: Completed 11/23/2020.  Repeat every year  Bone Density status: Completed 06/30/2020. Results reflect: Bone density results: NORMAL. Repeat every 5 years.  Lung Cancer Screening: (Low Dose CT Chest recommended if Age 36-80 years, 30 pack-year currently smoking OR have quit w/in 15years.) does not qualify.   Lung Cancer Screening Referral: no  Additional Screening:  Hepatitis C Screening: does not qualify; Completed no  Vision Screening: Recommended annual ophthalmology exams for early detection of glaucoma and other disorders of the eye. Is the patient up to date with their annual eye exam?  Yes  Who is the provider or what is the name of the office in which the patient attends annual eye exams? Clent Jacks, MD. If pt is not established with a provider, would they like to be referred to a provider to establish care? No .   Dental Screening: Recommended annual dental exams for proper oral hygiene  Community Resource Referral / Chronic Care Management: CRR required this visit?  No   CCM required this visit?  No      Plan:     I have personally reviewed and noted the following in the patient's chart:   Medical and social history Use of alcohol, tobacco or illicit drugs  Current medications and supplements including opioid prescriptions.  Functional ability and status Nutritional status Physical activity Advanced directives List of other physicians Hospitalizations, surgeries, and ER visits in previous 12 months Vitals Screenings to include cognitive, depression, and falls Referrals and appointments  In addition, I have reviewed and discussed with patient certain preventive protocols, quality metrics, and best practice recommendations. A written personalized care plan for preventive services as well as general preventive health recommendations were provided to patient.     Sheral Flow, LPN   07/37/1062   Nurse Notes:  Patient is cogitatively intact. There were no vitals filed for this  visit. There is no height or weight on file to calculate BMI. Hearing Screening - Comments:: Patient denied any hearing difficulty.   No hearing aids.  Vision Screening - Comments:: Patient wears corrective glasses/contacts.  Eye exam done annually by: Dr. Clent Jacks.

## 2021-08-25 ENCOUNTER — Ambulatory Visit: Payer: PPO | Admitting: Internal Medicine

## 2021-08-27 ENCOUNTER — Ambulatory Visit: Payer: PPO | Admitting: Internal Medicine

## 2021-08-27 ENCOUNTER — Encounter: Payer: Self-pay | Admitting: Internal Medicine

## 2021-08-27 ENCOUNTER — Ambulatory Visit: Payer: PPO

## 2021-08-27 ENCOUNTER — Ambulatory Visit (INDEPENDENT_AMBULATORY_CARE_PROVIDER_SITE_OTHER): Payer: PPO | Admitting: Internal Medicine

## 2021-08-27 ENCOUNTER — Other Ambulatory Visit: Payer: Self-pay

## 2021-08-27 VITALS — BP 138/70 | HR 101 | Temp 98.3°F | Ht 60.0 in | Wt 164.4 lb

## 2021-08-27 DIAGNOSIS — R5382 Chronic fatigue, unspecified: Secondary | ICD-10-CM

## 2021-08-27 DIAGNOSIS — R29898 Other symptoms and signs involving the musculoskeletal system: Secondary | ICD-10-CM | POA: Diagnosis not present

## 2021-08-27 DIAGNOSIS — B351 Tinea unguium: Secondary | ICD-10-CM

## 2021-08-27 DIAGNOSIS — E559 Vitamin D deficiency, unspecified: Secondary | ICD-10-CM | POA: Diagnosis not present

## 2021-08-27 DIAGNOSIS — M5440 Lumbago with sciatica, unspecified side: Secondary | ICD-10-CM | POA: Diagnosis not present

## 2021-08-27 DIAGNOSIS — F419 Anxiety disorder, unspecified: Secondary | ICD-10-CM

## 2021-08-27 DIAGNOSIS — N6452 Nipple discharge: Secondary | ICD-10-CM

## 2021-08-27 DIAGNOSIS — I10 Essential (primary) hypertension: Secondary | ICD-10-CM

## 2021-08-27 DIAGNOSIS — R202 Paresthesia of skin: Secondary | ICD-10-CM

## 2021-08-27 LAB — CBC WITH DIFFERENTIAL/PLATELET
Basophils Absolute: 0 10*3/uL (ref 0.0–0.1)
Basophils Relative: 0.5 % (ref 0.0–3.0)
Eosinophils Absolute: 0.3 10*3/uL (ref 0.0–0.7)
Eosinophils Relative: 3.5 % (ref 0.0–5.0)
HCT: 37.8 % (ref 36.0–46.0)
Hemoglobin: 12.8 g/dL (ref 12.0–15.0)
Lymphocytes Relative: 22.1 % (ref 12.0–46.0)
Lymphs Abs: 1.7 10*3/uL (ref 0.7–4.0)
MCHC: 33.8 g/dL (ref 30.0–36.0)
MCV: 92 fl (ref 78.0–100.0)
Monocytes Absolute: 0.8 10*3/uL (ref 0.1–1.0)
Monocytes Relative: 10.6 % (ref 3.0–12.0)
Neutro Abs: 4.9 10*3/uL (ref 1.4–7.7)
Neutrophils Relative %: 63.3 % (ref 43.0–77.0)
Platelets: 248 10*3/uL (ref 150.0–400.0)
RBC: 4.11 Mil/uL (ref 3.87–5.11)
RDW: 13 % (ref 11.5–15.5)
WBC: 7.8 10*3/uL (ref 4.0–10.5)

## 2021-08-27 LAB — COMPREHENSIVE METABOLIC PANEL
ALT: 43 U/L — ABNORMAL HIGH (ref 0–35)
AST: 38 U/L — ABNORMAL HIGH (ref 0–37)
Albumin: 4.3 g/dL (ref 3.5–5.2)
Alkaline Phosphatase: 74 U/L (ref 39–117)
BUN: 20 mg/dL (ref 6–23)
CO2: 30 mEq/L (ref 19–32)
Calcium: 9.7 mg/dL (ref 8.4–10.5)
Chloride: 100 mEq/L (ref 96–112)
Creatinine, Ser: 0.8 mg/dL (ref 0.40–1.20)
GFR: 72.27 mL/min (ref >60.00)
Glucose, Bld: 103 mg/dL — ABNORMAL HIGH (ref 70–99)
Potassium: 3.9 mEq/L (ref 3.5–5.1)
Sodium: 138 mEq/L (ref 135–145)
Total Bilirubin: 0.7 mg/dL (ref 0.2–1.2)
Total Protein: 7.3 g/dL (ref 6.0–8.3)

## 2021-08-27 LAB — VITAMIN D 25 HYDROXY (VIT D DEFICIENCY, FRACTURES): VITD: 49.76 ng/mL (ref 30.00–100.00)

## 2021-08-27 LAB — TSH: TSH: 2.61 u[IU]/mL (ref 0.35–5.50)

## 2021-08-27 LAB — VITAMIN B12: Vitamin B-12: 1264 pg/mL — ABNORMAL HIGH (ref 211–911)

## 2021-08-27 LAB — CK: Total CK: 89 U/L (ref 7–177)

## 2021-08-27 MED ORDER — CICLOPIROX 8 % EX SOLN: Freq: Every day | CUTANEOUS | 1 refills | Status: DC

## 2021-08-27 NOTE — Assessment & Plan Note (Signed)
New R big toenail discoloration Start Penlac

## 2021-08-27 NOTE — Assessment & Plan Note (Signed)
Chronic LBP  - a little better - pt saw Dr Vertell Limber - has not had surgery or injections; not recommended

## 2021-08-27 NOTE — Assessment & Plan Note (Signed)
Cont w/Irbesartan , Amlodipine 5 mg

## 2021-08-27 NOTE — Assessment & Plan Note (Signed)
Start walking 

## 2021-08-27 NOTE — Assessment & Plan Note (Signed)
Stable Xanax prn rare use  Potential benefits of a long term opioids use as well as potential risks (i.e. addiction risk, apnea etc) and complications (i.e. Somnolence, constipation and others) were explained to the patient and were aknowledged.

## 2021-08-27 NOTE — Assessment & Plan Note (Signed)
Likely deconditioning related. Start walking

## 2021-08-27 NOTE — Assessment & Plan Note (Signed)
S/p lumpectomy R - no cancer

## 2021-08-27 NOTE — Progress Notes (Signed)
Subjective:  Patient ID: Alexandra Scott, female    DOB: 10-19-1945  Age: 75 y.o. MRN: [id number]  CC: Follow-up (6 month f/u)   HPI Alexandra Scott presents for fatigue, OA, HTN f/u F/u LBP - spinal stenosis C/o R big toenail discoloration  Outpatient Medications Prior to Visit  Medication Sig Dispense Refill   amLODipine (NORVASC) 5 MG tablet Take 1 tablet (5 mg total) by mouth daily. 90 tablet 3   aspirin 81 MG EC tablet Take 81 mg by mouth at bedtime.     BIOTIN PO Take 1 tablet by mouth at bedtime.     Calcium Carbonate-Vitamin D3 600-400 MG-UNIT TABS Take 1 tablet by mouth 2 (two) times daily.      Cholecalciferol (VITAMIN D3) 50 MCG (2000 UT) capsule Take 1 capsule (2,000 Units total) by mouth daily. 100 capsule 3   irbesartan (AVAPRO) 300 MG tablet Take 1 tablet (300 mg total) by mouth daily. 90 tablet 3   Magnesium 250 MG TABS Take 250 mg by mouth daily.      Multiple Vitamins-Minerals (TH COMPLETE MULTI 50+ PO) Take by mouth.     Omega-3 Fatty Acids (FISH OIL) 1000 MG CAPS Take 1,000 mg by mouth 2 (two) times daily. Reported on 01/11/2016     Polyethyl Glycol-Propyl Glycol (SYSTANE OP) Place 1 drop into both eyes 2 (two) times daily as needed (dry eyes).     Turmeric 500 MG TABS Take 500 mg by mouth 2 (two) times daily.      vitamin C (ASCORBIC ACID) 500 MG tablet Take 500 mg by mouth at bedtime.      No facility-administered medications prior to visit.    ROS: Review of Systems  Constitutional:  Positive for fatigue. Negative for activity change, appetite change, chills and unexpected weight change.  HENT:  Negative for congestion, mouth sores and sinus pressure.   Eyes:  Negative for visual disturbance.  Respiratory:  Negative for cough and chest tightness.   Gastrointestinal:  Negative for abdominal pain and nausea.  Genitourinary:  Negative for difficulty urinating, frequency and vaginal pain.  Musculoskeletal:  Positive for arthralgias, back pain and gait  problem.  Skin:  Negative for pallor and rash.  Neurological:  Negative for dizziness, tremors, weakness, numbness and headaches.  Psychiatric/Behavioral:  Negative for confusion, sleep disturbance and suicidal ideas.    Objective:  BP 138/70 (BP Location: Left Arm)   Pulse (!) 101   Temp 98.3 F (36.8 C) (Oral)   Ht 5' (1.524 m)   Wt 164 lb 6.4 oz (74.6 kg)   SpO2 95%   BMI 32.11 kg/m   BP Readings from Last 3 Encounters:  08/27/21 138/70  03/30/21 120/67  02/23/21 (!) 142/80    Wt Readings from Last 3 Encounters:  08/27/21 164 lb 6.4 oz (74.6 kg)  03/30/21 162 lb 4.1 oz (73.6 kg)  02/23/21 159 lb 3.2 oz (72.2 kg)    Physical Exam Constitutional:      General: She is not in acute distress.    Appearance: She is well-developed. She is obese.  HENT:     Head: Normocephalic.     Right Ear: External ear normal.     Left Ear: External ear normal.     Nose: Nose normal.  Eyes:     General:        Right eye: No discharge.        Left eye: No discharge.     Conjunctiva/sclera: Conjunctivae normal.  Pupils: Pupils are equal, round, and reactive to light.  Neck:     Thyroid: No thyromegaly.     Vascular: No JVD.     Trachea: No tracheal deviation.  Cardiovascular:     Rate and Rhythm: Normal rate and regular rhythm.     Heart sounds: Normal heart sounds.  Pulmonary:     Effort: No respiratory distress.     Breath sounds: No stridor. No wheezing.  Abdominal:     General: Bowel sounds are normal. There is no distension.     Palpations: Abdomen is soft. There is no mass.     Tenderness: There is no abdominal tenderness. There is no guarding or rebound.  Musculoskeletal:        General: No tenderness.     Cervical back: Normal range of motion and neck supple. No rigidity.  Lymphadenopathy:     Cervical: No cervical adenopathy.  Skin:    Findings: No erythema or rash.  Neurological:     Cranial Nerves: No cranial nerve deficit.     Motor: No abnormal muscle  tone.     Coordination: Coordination normal.     Deep Tendon Reflexes: Reflexes normal.  Psychiatric:        Behavior: Behavior normal.        Thought Content: Thought content normal.        Judgment: Judgment normal.  R big toenail discoloration  Lab Results  Component Value Date   WBC 7.3 02/23/2021   HGB 12.8 02/23/2021   HCT 37.5 02/23/2021   PLT 250.0 02/23/2021   GLUCOSE 106 (H) 02/23/2021   CHOL 147 02/07/2018   TRIG 70.0 02/07/2018   HDL 44.30 02/07/2018   LDLDIRECT 150.6 10/15/2007   LDLCALC 89 02/07/2018   ALT 25 02/23/2021   AST 24 02/23/2021   NA 136 02/23/2021   K 4.0 02/23/2021   CL 101 02/23/2021   CREATININE 0.79 02/23/2021   BUN 24 (H) 02/23/2021   CO2 26 02/23/2021   TSH 3.05 04/23/2020   INR 1.0 02/23/2021   HGBA1C 5.4 04/23/2020    No results found.  Assessment & Plan:   Problem List Items Addressed This Visit     Anxiety disorder    Stable Xanax prn rare use  Potential benefits of a long term opioids use as well as potential risks (i.e. addiction risk, apnea etc) and complications (i.e. Somnolence, constipation and others) were explained to the patient and were aknowledged.      Discharge from right nipple    S/p lumpectomy R - no cancer      Essential hypertension    Cont w/Irbesartan , Amlodipine 5 mg      Fatigue    Likely deconditioning related. Start walking      Leg weakness, bilateral - Primary    Start walking      Relevant Orders   CK   Vitamin B12   CBC with Differential/Platelet   Comprehensive metabolic panel   TSH   LOW BACK PAIN    Chronic LBP  - a little better - pt saw Dr Vertell Limber - has not had surgery or injections; not recommended       Onychomycosis    New R big toenail discoloration Start Penlac       Relevant Medications   ciclopirox (PENLAC) 8 % solution   Paresthesia   Relevant Orders   Vitamin B12   Other Visit Diagnoses     Vitamin D deficiency  Relevant Orders   VITAMIN D 25  Hydroxy (Vit-D Deficiency, Fractures)         Meds ordered this encounter  Medications   ciclopirox (PENLAC) 8 % solution    Sig: Apply topically at bedtime. Apply over nail and surrounding skin daily    Dispense:  6.6 mL    Refill:  1      Follow-up: Return in about 6 months (around 02/25/2022) for Wellness Exam.  Walker Kehr, MD

## 2021-08-29 ENCOUNTER — Other Ambulatory Visit: Payer: Self-pay | Admitting: Internal Medicine

## 2021-08-29 DIAGNOSIS — R748 Abnormal levels of other serum enzymes: Secondary | ICD-10-CM | POA: Insufficient documentation

## 2021-09-07 DIAGNOSIS — H02055 Trichiasis without entropian left lower eyelid: Secondary | ICD-10-CM | POA: Diagnosis not present

## 2021-09-14 ENCOUNTER — Ambulatory Visit
Admission: RE | Admit: 2021-09-14 | Discharge: 2021-09-14 | Disposition: A | Discharge status: Home / Self Care | Payer: PPO | Source: Ambulatory Visit | Attending: Internal Medicine | Admitting: Internal Medicine

## 2021-09-14 DIAGNOSIS — K760 Fatty (change of) liver, not elsewhere classified: Secondary | ICD-10-CM | POA: Diagnosis not present

## 2021-09-14 DIAGNOSIS — R748 Abnormal levels of other serum enzymes: Secondary | ICD-10-CM

## 2021-09-21 DIAGNOSIS — D485 Neoplasm of uncertain behavior of skin: Secondary | ICD-10-CM | POA: Diagnosis not present

## 2021-09-21 DIAGNOSIS — C4441 Basal cell carcinoma of skin of scalp and neck: Secondary | ICD-10-CM | POA: Diagnosis not present

## 2021-11-02 DIAGNOSIS — C4441 Basal cell carcinoma of skin of scalp and neck: Secondary | ICD-10-CM | POA: Diagnosis not present

## 2021-11-29 DIAGNOSIS — Z1231 Encounter for screening mammogram for malignant neoplasm of breast: Secondary | ICD-10-CM | POA: Diagnosis not present

## 2021-12-14 DIAGNOSIS — C4441 Basal cell carcinoma of skin of scalp and neck: Secondary | ICD-10-CM | POA: Diagnosis not present

## 2022-02-28 ENCOUNTER — Encounter: Payer: Self-pay | Admitting: Internal Medicine

## 2022-02-28 ENCOUNTER — Ambulatory Visit (INDEPENDENT_AMBULATORY_CARE_PROVIDER_SITE_OTHER): Payer: PPO | Admitting: Internal Medicine

## 2022-02-28 VITALS — BP 110/78 | HR 86 | Temp 98.3°F | Ht 60.0 in | Wt 166.0 lb

## 2022-02-28 DIAGNOSIS — L659 Nonscarring hair loss, unspecified: Secondary | ICD-10-CM | POA: Diagnosis not present

## 2022-02-28 DIAGNOSIS — R202 Paresthesia of skin: Secondary | ICD-10-CM | POA: Diagnosis not present

## 2022-02-28 DIAGNOSIS — F419 Anxiety disorder, unspecified: Secondary | ICD-10-CM | POA: Diagnosis not present

## 2022-02-28 DIAGNOSIS — N644 Mastodynia: Secondary | ICD-10-CM | POA: Insufficient documentation

## 2022-02-28 DIAGNOSIS — M4316 Spondylolisthesis, lumbar region: Secondary | ICD-10-CM | POA: Insufficient documentation

## 2022-02-28 DIAGNOSIS — R600 Localized edema: Secondary | ICD-10-CM | POA: Diagnosis not present

## 2022-02-28 DIAGNOSIS — M8580 Other specified disorders of bone density and structure, unspecified site: Secondary | ICD-10-CM | POA: Insufficient documentation

## 2022-02-28 DIAGNOSIS — I10 Essential (primary) hypertension: Secondary | ICD-10-CM

## 2022-02-28 DIAGNOSIS — N859 Noninflammatory disorder of uterus, unspecified: Secondary | ICD-10-CM | POA: Insufficient documentation

## 2022-02-28 DIAGNOSIS — M419 Scoliosis, unspecified: Secondary | ICD-10-CM | POA: Insufficient documentation

## 2022-02-28 DIAGNOSIS — M48061 Spinal stenosis, lumbar region without neurogenic claudication: Secondary | ICD-10-CM | POA: Insufficient documentation

## 2022-02-28 DIAGNOSIS — R1900 Intra-abdominal and pelvic swelling, mass and lump, unspecified site: Secondary | ICD-10-CM | POA: Insufficient documentation

## 2022-02-28 DIAGNOSIS — E669 Obesity, unspecified: Secondary | ICD-10-CM | POA: Insufficient documentation

## 2022-02-28 DIAGNOSIS — M4120 Other idiopathic scoliosis, site unspecified: Secondary | ICD-10-CM | POA: Insufficient documentation

## 2022-02-28 LAB — COMPREHENSIVE METABOLIC PANEL
ALT: 50 U/L — ABNORMAL HIGH (ref 0–35)
AST: 43 U/L — ABNORMAL HIGH (ref 0–37)
Albumin: 4.4 g/dL (ref 3.5–5.2)
Alkaline Phosphatase: 80 U/L (ref 39–117)
BUN: 16 mg/dL (ref 6–23)
CO2: 28 mEq/L (ref 19–32)
Calcium: 9.8 mg/dL (ref 8.4–10.5)
Chloride: 99 mEq/L (ref 96–112)
Creatinine, Ser: 0.79 mg/dL (ref 0.40–1.20)
GFR: 73.11 mL/min (ref >60.00)
Glucose, Bld: 110 mg/dL — ABNORMAL HIGH (ref 70–99)
Potassium: 4.3 mEq/L (ref 3.5–5.1)
Sodium: 135 mEq/L (ref 135–145)
Total Bilirubin: 0.8 mg/dL (ref 0.2–1.2)
Total Protein: 7.7 g/dL (ref 6.0–8.3)

## 2022-02-28 LAB — URINALYSIS, ROUTINE W REFLEX MICROSCOPIC
Bilirubin Urine: NEGATIVE
Ketones, ur: NEGATIVE
Nitrite: NEGATIVE
RBC / HPF: NONE SEEN (ref 0–?)
Specific Gravity, Urine: 1.01 (ref 1.000–1.030)
Total Protein, Urine: NEGATIVE
Urine Glucose: NEGATIVE
Urobilinogen, UA: 0.2 (ref 0.0–1.0)
pH: 7.5 (ref 5.0–8.0)

## 2022-02-28 LAB — TSH: TSH: 2.73 u[IU]/mL (ref 0.35–5.50)

## 2022-02-28 MED ORDER — FUROSEMIDE 20 MG PO TABS
20.0000 mg | ORAL_TABLET | Freq: Every day | ORAL | 3 refills | Status: AC | PRN
Start: 2022-02-28 — End: ?

## 2022-02-28 NOTE — Progress Notes (Signed)
Subjective:  Patient ID: Alexandra Scott, female    DOB: 1946-08-26  Age: 76 y.o. MRN: [id number]  CC: No chief complaint on file.   HPI Alexandra Scott presents for HTN, fatigue, OA C/o L pinky tip is tingly x 3 months  Outpatient Medications Prior to Visit  Medication Sig Dispense Refill   amLODipine (NORVASC) 5 MG tablet Take 1 tablet (5 mg total) by mouth daily. 90 tablet 3   aspirin 81 MG EC tablet Take 81 mg by mouth at bedtime.     BIOTIN PO Take 1 tablet by mouth at bedtime.     Calcium Carbonate-Vitamin D3 600-400 MG-UNIT TABS Take 1 tablet by mouth 2 (two) times daily.      Cholecalciferol (VITAMIN D3) 50 MCG (2000 UT) capsule Take 1 capsule (2,000 Units total) by mouth daily. 100 capsule 3   ciclopirox (PENLAC) 8 % solution Apply topically at bedtime. Apply over nail and surrounding skin daily 6.6 mL 1   irbesartan (AVAPRO) 300 MG tablet Take 1 tablet (300 mg total) by mouth daily. 90 tablet 3   Magnesium 250 MG TABS Take 250 mg by mouth daily.      Multiple Vitamins-Minerals (TH COMPLETE MULTI 50+ PO) Take by mouth.     Omega-3 Fatty Acids (FISH OIL) 1000 MG CAPS Take 1,000 mg by mouth 2 (two) times daily. Reported on 01/11/2016     Polyethyl Glycol-Propyl Glycol (SYSTANE OP) Place 1 drop into both eyes 2 (two) times daily as needed (dry eyes).     Turmeric 500 MG TABS Take 500 mg by mouth 2 (two) times daily.      vitamin C (ASCORBIC ACID) 500 MG tablet Take 500 mg by mouth at bedtime.      No facility-administered medications prior to visit.    ROS: Review of Systems  Constitutional:  Positive for fatigue. Negative for activity change, appetite change, chills and unexpected weight change.  HENT:  Negative for congestion, mouth sores and sinus pressure.   Eyes:  Negative for visual disturbance.  Respiratory:  Negative for cough and chest tightness.   Cardiovascular:  Positive for leg swelling.  Gastrointestinal:  Negative for abdominal pain and nausea.   Genitourinary:  Negative for difficulty urinating, frequency and vaginal pain.  Musculoskeletal:  Positive for arthralgias and gait problem. Negative for back pain.  Skin:  Negative for pallor and rash.  Neurological:  Negative for dizziness, tremors, weakness, numbness and headaches.  Psychiatric/Behavioral:  Negative for confusion and sleep disturbance.    Objective:  BP 110/78 (BP Location: Left Arm, Patient Position: Sitting, Cuff Size: Large)   Pulse 86   Temp 98.3 F (36.8 C) (Oral)   Ht 5' (1.524 m)   Wt 166 lb (75.3 kg)   SpO2 97%   BMI 32.42 kg/m   BP Readings from Last 3 Encounters:  02/28/22 110/78  08/27/21 138/70  03/30/21 120/67    Wt Readings from Last 3 Encounters:  02/28/22 166 lb (75.3 kg)  08/27/21 164 lb 6.4 oz (74.6 kg)  03/30/21 162 lb 4.1 oz (73.6 kg)    Physical Exam Constitutional:      General: She is not in acute distress.    Appearance: She is well-developed. She is obese.  HENT:     Head: Normocephalic.     Right Ear: External ear normal.     Left Ear: External ear normal.     Nose: Nose normal.  Eyes:     General:  Right eye: No discharge.        Left eye: No discharge.     Conjunctiva/sclera: Conjunctivae normal.     Pupils: Pupils are equal, round, and reactive to light.  Neck:     Thyroid: No thyromegaly.     Vascular: No JVD.     Trachea: No tracheal deviation.  Cardiovascular:     Rate and Rhythm: Normal rate and regular rhythm.     Heart sounds: Normal heart sounds.  Pulmonary:     Effort: No respiratory distress.     Breath sounds: No stridor. No wheezing.  Abdominal:     General: Bowel sounds are normal. There is no distension.     Palpations: Abdomen is soft. There is no mass.     Tenderness: There is no abdominal tenderness. There is no guarding or rebound.  Musculoskeletal:        General: Tenderness present.     Cervical back: Normal range of motion and neck supple. No rigidity.     Right lower leg: Edema  present.     Left lower leg: Edema present.  Lymphadenopathy:     Cervical: No cervical adenopathy.  Skin:    Findings: No erythema or rash.  Neurological:     Cranial Nerves: No cranial nerve deficit.     Motor: No abnormal muscle tone.     Coordination: Coordination normal.     Deep Tendon Reflexes: Reflexes normal.  Psychiatric:        Behavior: Behavior normal.        Thought Content: Thought content normal.        Judgment: Judgment normal.  Trace edema B  Lab Results  Component Value Date   WBC 7.8 08/27/2021   HGB 12.8 08/27/2021   HCT 37.8 08/27/2021   PLT 248.0 08/27/2021   GLUCOSE 103 (H) 08/27/2021   CHOL 147 02/07/2018   TRIG 70.0 02/07/2018   HDL 44.30 02/07/2018   LDLDIRECT 150.6 10/15/2007   LDLCALC 89 02/07/2018   ALT 43 (H) 08/27/2021   AST 38 (H) 08/27/2021   NA 138 08/27/2021   K 3.9 08/27/2021   CL 100 08/27/2021   CREATININE 0.80 08/27/2021   BUN 20 08/27/2021   CO2 30 08/27/2021   TSH 2.61 08/27/2021   INR 1.0 02/23/2021   HGBA1C 5.4 04/23/2020    US Abdomen Limited RUQ (LIVER/GB)  Result Date: 09/14/2021 CLINICAL DATA:  Elevated liver function tests.  Thanks EXAM: ULTRASOUND ABDOMEN LIMITED RIGHT UPPER QUADRANT COMPARISON:  CT 01/23/2017 FINDINGS: Gallbladder: No gallstones or wall thickening visualized. No sonographic Murphy sign noted by sonographer. Common bile duct: Diameter: 2.8 mm Liver: Diffusely increased liver echogenicity. Portal vein is patent on color Doppler imaging with normal direction of blood flow towards the liver. Other: None. IMPRESSION: Hepatic steatosis. Unremarkable gallbladder and biliary ducts. Electronically Signed   By: Maurine Simmering M.D.   On: 09/14/2021 15:35    Assessment & Plan:   Problem List Items Addressed This Visit     Anxiety disorder    Xanax prn rare use  Potential benefits of a long term opioids use as well as potential risks (i.e. addiction risk, apnea etc) and complications (i.e. Somnolence,  constipation and others) were explained to the patient and were aknowledged.       Edema - Primary    Worse Added a low dose Lasix      Relevant Orders   Comprehensive metabolic panel   TSH   Urinalysis  Essential hypertension    Cont w/Irbesartan , Amlodipine 5 mg Check CMET      Relevant Medications   furosemide (LASIX) 20 MG tablet   Hair loss    Try Rogaine for women      Relevant Orders   Comprehensive metabolic panel   TSH   Paresthesia    New -  L pinky - change the way you hold the ipad      Relevant Orders   Comprehensive metabolic panel   TSH      Meds ordered this encounter  Medications   furosemide (LASIX) 20 MG tablet    Sig: Take 1-2 tablets (20-40 mg total) by mouth daily as needed.    Dispense:  30 tablet    Refill:  3      Follow-up: Return in about 3 months (around 05/31/2022) for a follow-up visit.  Walker Kehr, MD

## 2022-02-28 NOTE — Assessment & Plan Note (Addendum)
Try Rogaine for women

## 2022-02-28 NOTE — Assessment & Plan Note (Signed)
Xanax prn rare use  Potential benefits of a long term opioids use as well as potential risks (i.e. addiction risk, apnea etc) and complications (i.e. Somnolence, constipation and others) were explained to the patient and were aknowledged.

## 2022-02-28 NOTE — Assessment & Plan Note (Signed)
New -  L pinky - change the way you hold the ipad

## 2022-02-28 NOTE — Assessment & Plan Note (Signed)
Worse Added a low dose Lasix

## 2022-02-28 NOTE — Patient Instructions (Addendum)
Change the way you hold the ipad  Try Rogaine for women

## 2022-02-28 NOTE — Assessment & Plan Note (Addendum)
Cont w/Irbesartan , Amlodipine 5 mg Check CMET

## 2022-03-01 ENCOUNTER — Telehealth: Payer: Self-pay | Admitting: Internal Medicine

## 2022-03-01 ENCOUNTER — Other Ambulatory Visit: Payer: Self-pay | Admitting: Internal Medicine

## 2022-03-01 MED ORDER — CIPROFLOXACIN HCL 250 MG PO TABS: 250.0000 mg | ORAL_TABLET | Freq: Two times a day (BID) | ORAL | 0 refills | Status: DC

## 2022-03-01 NOTE — Telephone Encounter (Signed)
Patient received message that she needs to cut back on tylenol, but she is not taking anything with tylenol in it.  Please advise.

## 2022-03-02 NOTE — Telephone Encounter (Signed)
Please contact the pt regarding this message as she called again this morning to follow- up.

## 2022-03-02 NOTE — Telephone Encounter (Signed)
Spoke with the patient and she stated that she is not taking tylenol. She is wondering could it be coming from her taking Biotin for her brittle nails.

## 2022-03-07 NOTE — Telephone Encounter (Signed)
Yes, there is a minor possibility.  Please stop biotin.  Thank you

## 2022-03-07 NOTE — Telephone Encounter (Signed)
Spoke with the patient and she verbalized understanding. No questions or concerns.  

## 2022-03-08 ENCOUNTER — Other Ambulatory Visit: Payer: Self-pay | Admitting: Internal Medicine

## 2022-03-15 DIAGNOSIS — L570 Actinic keratosis: Secondary | ICD-10-CM | POA: Diagnosis not present

## 2022-03-15 DIAGNOSIS — Z85828 Personal history of other malignant neoplasm of skin: Secondary | ICD-10-CM | POA: Diagnosis not present

## 2022-03-15 DIAGNOSIS — Z08 Encounter for follow-up examination after completed treatment for malignant neoplasm: Secondary | ICD-10-CM | POA: Diagnosis not present

## 2022-03-15 DIAGNOSIS — X32XXXD Exposure to sunlight, subsequent encounter: Secondary | ICD-10-CM | POA: Diagnosis not present

## 2022-04-07 ENCOUNTER — Other Ambulatory Visit: Payer: Self-pay | Admitting: Internal Medicine

## 2022-04-21 ENCOUNTER — Telehealth: Payer: Self-pay | Admitting: *Deleted

## 2022-04-21 ENCOUNTER — Other Ambulatory Visit: Payer: Self-pay | Admitting: Internal Medicine

## 2022-04-21 NOTE — Telephone Encounter (Signed)
Rec'd determination med was APPROVED. Effective 04/21/22 through 04/22/2023. Faxing approval to [facility].//lb

## 2022-04-21 NOTE — Telephone Encounter (Signed)
Pt was on cover-my-meds need PA on Penlac Sol 8%. Submitted PA w/ (Key: [id number]) Rec'd msg PA sent to [employer]...Johny Chess

## 2022-05-23 DIAGNOSIS — H0102A Squamous blepharitis right eye, upper and lower eyelids: Secondary | ICD-10-CM | POA: Diagnosis not present

## 2022-05-23 DIAGNOSIS — H0102B Squamous blepharitis left eye, upper and lower eyelids: Secondary | ICD-10-CM | POA: Diagnosis not present

## 2022-05-23 DIAGNOSIS — H16143 Punctate keratitis, bilateral: Secondary | ICD-10-CM | POA: Diagnosis not present

## 2022-05-23 DIAGNOSIS — H04123 Dry eye syndrome of bilateral lacrimal glands: Secondary | ICD-10-CM | POA: Diagnosis not present

## 2022-05-23 DIAGNOSIS — H02055 Trichiasis without entropian left lower eyelid: Secondary | ICD-10-CM | POA: Diagnosis not present

## 2022-06-07 ENCOUNTER — Encounter: Payer: Self-pay | Admitting: Internal Medicine

## 2022-06-07 ENCOUNTER — Ambulatory Visit (INDEPENDENT_AMBULATORY_CARE_PROVIDER_SITE_OTHER): Payer: PPO | Admitting: Internal Medicine

## 2022-06-07 VITALS — BP 120/68 | HR 83 | Temp 98.7°F | Ht 60.0 in | Wt 167.4 lb

## 2022-06-07 DIAGNOSIS — I10 Essential (primary) hypertension: Secondary | ICD-10-CM | POA: Diagnosis not present

## 2022-06-07 DIAGNOSIS — R7989 Other specified abnormal findings of blood chemistry: Secondary | ICD-10-CM | POA: Diagnosis not present

## 2022-06-07 DIAGNOSIS — F419 Anxiety disorder, unspecified: Secondary | ICD-10-CM | POA: Diagnosis not present

## 2022-06-07 DIAGNOSIS — R600 Localized edema: Secondary | ICD-10-CM | POA: Diagnosis not present

## 2022-06-07 DIAGNOSIS — R739 Hyperglycemia, unspecified: Secondary | ICD-10-CM

## 2022-06-07 LAB — COMPREHENSIVE METABOLIC PANEL
ALT: 55 U/L — ABNORMAL HIGH (ref 0–35)
AST: 60 U/L — ABNORMAL HIGH (ref 0–37)
Albumin: 4.3 g/dL (ref 3.5–5.2)
Alkaline Phosphatase: 75 U/L (ref 39–117)
BUN: 17 mg/dL (ref 6–23)
CO2: 30 mEq/L (ref 19–32)
Calcium: 10.1 mg/dL (ref 8.4–10.5)
Chloride: 100 mEq/L (ref 96–112)
Creatinine, Ser: 0.84 mg/dL (ref 0.40–1.20)
GFR: 67.79 mL/min (ref >60.00)
Glucose, Bld: 102 mg/dL — ABNORMAL HIGH (ref 70–99)
Potassium: 4.2 mEq/L (ref 3.5–5.1)
Sodium: 137 mEq/L (ref 135–145)
Total Bilirubin: 1 mg/dL (ref 0.2–1.2)
Total Protein: 7.7 g/dL (ref 6.0–8.3)

## 2022-06-07 LAB — HEMOGLOBIN A1C: Hgb A1c MFr Bld: 5.9 % (ref 4.6–6.5)

## 2022-06-07 NOTE — Assessment & Plan Note (Signed)
Low dose Lasix prn Cont w/Irbesartan , Amlodipine 5 mg

## 2022-06-07 NOTE — Assessment & Plan Note (Signed)
Continue w/Xanax prn rare use  Potential benefits of a long term opioids use as well as potential risks (i.e. addiction risk, apnea etc) and complications (i.e. Somnolence, constipation and others) were explained to the patient and were aknowledged.

## 2022-06-07 NOTE — Assessment & Plan Note (Signed)
Fatty liver on [place] in 2022 Repeat LFTs, loose wt

## 2022-06-07 NOTE — Progress Notes (Signed)
Subjective:  Patient ID: Alexandra Scott, female    DOB: Dec 07, 1945  Age: 76 y.o. MRN: [id number]  CC: Follow-up   HPI Alexandra Scott presents for anxiety, fatigue, OA, HTN  Outpatient Medications Prior to Visit  Medication Sig Dispense Refill   amLODipine (NORVASC) 5 MG tablet Take 1 tablet by mouth once daily 90 tablet 1   aspirin 81 MG EC tablet Take 81 mg by mouth at bedtime.     BIOTIN PO Take 1 tablet by mouth at bedtime.     Calcium Carbonate-Vitamin D3 600-400 MG-UNIT TABS Take 1 tablet by mouth 2 (two) times daily.      Cholecalciferol (VITAMIN D3) 50 MCG (2000 UT) capsule Take 1 capsule (2,000 Units total) by mouth daily. 100 capsule 3   ciclopirox (PENLAC) 8 % solution APPLY TOPICALLY AT BEDIME. APPLY OVER NAIL AND SURROUNDING SKIN DAILY 7 mL 0   ciprofloxacin (CIPRO) 250 MG tablet Take 1 tablet (250 mg total) by mouth 2 (two) times daily. 10 tablet 0   furosemide (LASIX) 20 MG tablet Take 1-2 tablets (20-40 mg total) by mouth daily as needed. 30 tablet 3   irbesartan (AVAPRO) 300 MG tablet Take 1 tablet by mouth once daily 90 tablet 1   Magnesium 250 MG TABS Take 250 mg by mouth daily.      Multiple Vitamins-Minerals (TH COMPLETE MULTI 50+ PO) Take by mouth.     Omega-3 Fatty Acids (FISH OIL) 1000 MG CAPS Take 1,000 mg by mouth 2 (two) times daily. Reported on 01/11/2016     Polyethyl Glycol-Propyl Glycol (SYSTANE OP) Place 1 drop into both eyes 2 (two) times daily as needed (dry eyes).     Turmeric 500 MG TABS Take 500 mg by mouth 2 (two) times daily.      vitamin C (ASCORBIC ACID) 500 MG tablet Take 500 mg by mouth at bedtime.      No facility-administered medications prior to visit.    ROS: Review of Systems  Constitutional:  Positive for fatigue. Negative for activity change, appetite change, chills and unexpected weight change.  HENT:  Negative for congestion, mouth sores and sinus pressure.   Eyes:  Negative for visual disturbance.  Respiratory:  Negative  for cough and chest tightness.   Gastrointestinal:  Negative for abdominal pain and nausea.  Genitourinary:  Negative for difficulty urinating, frequency and vaginal pain.  Musculoskeletal:  Positive for arthralgias and gait problem. Negative for back pain.  Skin:  Negative for pallor and rash.  Neurological:  Negative for dizziness, tremors, weakness, numbness and headaches.  Psychiatric/Behavioral:  Negative for confusion and sleep disturbance.     Objective:  BP 120/68 (BP Location: Left Arm)   Pulse 83   Temp 98.7 F (37.1 C) (Oral)   Ht 5' (1.524 m)   Wt 167 lb 6.4 oz (75.9 kg)   SpO2 97%   BMI 32.69 kg/m   BP Readings from Last 3 Encounters:  06/07/22 120/68  02/28/22 110/78  08/27/21 138/70    Wt Readings from Last 3 Encounters:  06/07/22 167 lb 6.4 oz (75.9 kg)  02/28/22 166 lb (75.3 kg)  08/27/21 164 lb 6.4 oz (74.6 kg)    Physical Exam Constitutional:      General: She is not in acute distress.    Appearance: She is well-developed. She is obese.  HENT:     Head: Normocephalic.     Right Ear: External ear normal.     Left Ear: External ear  normal.     Nose: Nose normal.  Eyes:     General:        Right eye: No discharge.        Left eye: No discharge.     Conjunctiva/sclera: Conjunctivae normal.     Pupils: Pupils are equal, round, and reactive to light.  Neck:     Thyroid: No thyromegaly.     Vascular: No JVD.     Trachea: No tracheal deviation.  Cardiovascular:     Rate and Rhythm: Normal rate and regular rhythm.     Heart sounds: Normal heart sounds.  Pulmonary:     Effort: No respiratory distress.     Breath sounds: No stridor. No wheezing.  Abdominal:     General: Bowel sounds are normal. There is no distension.     Palpations: Abdomen is soft. There is no mass.     Tenderness: There is no abdominal tenderness. There is no guarding or rebound.  Musculoskeletal:        General: Tenderness present.     Cervical back: Normal range of motion  and neck supple. No rigidity.  Lymphadenopathy:     Cervical: No cervical adenopathy.  Skin:    Findings: No erythema or rash.  Neurological:     Mental Status: She is oriented to person, place, and time.     Cranial Nerves: No cranial nerve deficit.     Motor: No abnormal muscle tone.     Coordination: Coordination normal.     Gait: Gait abnormal.     Deep Tendon Reflexes: Reflexes normal.  Psychiatric:        Behavior: Behavior normal.        Thought Content: Thought content normal.        Judgment: Judgment normal.   Antalgic gait Trace edema B ankles   Lab Results  Component Value Date   WBC 7.8 08/27/2021   HGB 12.8 08/27/2021   HCT 37.8 08/27/2021   PLT 248.0 08/27/2021   GLUCOSE 102 (H) 06/07/2022   CHOL 147 02/07/2018   TRIG 70.0 02/07/2018   HDL 44.30 02/07/2018   LDLDIRECT 150.6 10/15/2007   LDLCALC 89 02/07/2018   ALT 55 (H) 06/07/2022   AST 60 (H) 06/07/2022   NA 137 06/07/2022   K 4.2 06/07/2022   CL 100 06/07/2022   CREATININE 0.84 06/07/2022   BUN 17 06/07/2022   CO2 30 06/07/2022   TSH 2.73 02/28/2022   INR 1.0 02/23/2021   HGBA1C 5.9 06/07/2022    US Abdomen Limited RUQ (LIVER/GB)  Result Date: 09/14/2021 CLINICAL DATA:  Elevated liver function tests.  Thanks EXAM: ULTRASOUND ABDOMEN LIMITED RIGHT UPPER QUADRANT COMPARISON:  CT 01/23/2017 FINDINGS: Gallbladder: No gallstones or wall thickening visualized. No sonographic Murphy sign noted by sonographer. Common bile duct: Diameter: 2.8 mm Liver: Diffusely increased liver echogenicity. Portal vein is patent on color Doppler imaging with normal direction of blood flow towards the liver. Other: None. IMPRESSION: Hepatic steatosis. Unremarkable gallbladder and biliary ducts. Electronically Signed   By: Maurine Simmering M.D.   On: 09/14/2021 15:35    Assessment & Plan:   Problem List Items Addressed This Visit     Anxiety disorder    Continue w/Xanax prn rare use  Potential benefits of a long term  opioids use as well as potential risks (i.e. addiction risk, apnea etc) and complications (i.e. Somnolence, constipation and others) were explained to the patient and were aknowledged.      Edema  Mild, chronic Lasix prn      Relevant Orders   Comprehensive metabolic panel (Completed)   Hemoglobin A1c (Completed)   Elevated LFTs - Primary    Fatty liver on [place] in 2022 Repeat LFTs, loose wt      Relevant Orders   Comprehensive metabolic panel (Completed)   Essential hypertension    Low dose Lasix prn Cont w/Irbesartan , Amlodipine 5 mg      Hyperglycemia   Relevant Orders   Hemoglobin A1c (Completed)      No orders of the defined types were placed in this encounter.     Follow-up: Return in about 3 months (around 09/06/2022) for a follow-up visit.  Walker Kehr, MD

## 2022-06-07 NOTE — Assessment & Plan Note (Signed)
Mild, chronic Lasix prn

## 2022-06-08 ENCOUNTER — Encounter: Payer: Self-pay | Admitting: Internal Medicine

## 2022-06-08 DIAGNOSIS — K760 Fatty (change of) liver, not elsewhere classified: Secondary | ICD-10-CM | POA: Insufficient documentation

## 2022-06-14 DIAGNOSIS — Z6832 Body mass index (BMI) 32.0-32.9, adult: Secondary | ICD-10-CM | POA: Diagnosis not present

## 2022-06-14 DIAGNOSIS — Z01419 Encounter for gynecological examination (general) (routine) without abnormal findings: Secondary | ICD-10-CM | POA: Diagnosis not present

## 2022-06-21 DIAGNOSIS — H02834 Dermatochalasis of left upper eyelid: Secondary | ICD-10-CM | POA: Diagnosis not present

## 2022-06-21 DIAGNOSIS — H5703 Miosis: Secondary | ICD-10-CM | POA: Diagnosis not present

## 2022-06-21 DIAGNOSIS — H0102B Squamous blepharitis left eye, upper and lower eyelids: Secondary | ICD-10-CM | POA: Diagnosis not present

## 2022-06-21 DIAGNOSIS — Z1283 Encounter for screening for malignant neoplasm of skin: Secondary | ICD-10-CM | POA: Diagnosis not present

## 2022-06-21 DIAGNOSIS — H04123 Dry eye syndrome of bilateral lacrimal glands: Secondary | ICD-10-CM | POA: Diagnosis not present

## 2022-06-21 DIAGNOSIS — H02831 Dermatochalasis of right upper eyelid: Secondary | ICD-10-CM | POA: Diagnosis not present

## 2022-06-21 DIAGNOSIS — L304 Erythema intertrigo: Secondary | ICD-10-CM | POA: Diagnosis not present

## 2022-06-21 DIAGNOSIS — X32XXXD Exposure to sunlight, subsequent encounter: Secondary | ICD-10-CM | POA: Diagnosis not present

## 2022-06-21 DIAGNOSIS — H11153 Pinguecula, bilateral: Secondary | ICD-10-CM | POA: Diagnosis not present

## 2022-06-21 DIAGNOSIS — L570 Actinic keratosis: Secondary | ICD-10-CM | POA: Diagnosis not present

## 2022-06-21 DIAGNOSIS — D225 Melanocytic nevi of trunk: Secondary | ICD-10-CM | POA: Diagnosis not present

## 2022-06-21 DIAGNOSIS — H0102A Squamous blepharitis right eye, upper and lower eyelids: Secondary | ICD-10-CM | POA: Diagnosis not present

## 2022-06-21 DIAGNOSIS — H25813 Combined forms of age-related cataract, bilateral: Secondary | ICD-10-CM | POA: Diagnosis not present

## 2022-08-04 DIAGNOSIS — M545 Low back pain, unspecified: Secondary | ICD-10-CM | POA: Diagnosis not present

## 2022-08-04 DIAGNOSIS — M25561 Pain in right knee: Secondary | ICD-10-CM | POA: Diagnosis not present

## 2022-08-04 DIAGNOSIS — Z96641 Presence of right artificial hip joint: Secondary | ICD-10-CM | POA: Diagnosis not present

## 2022-08-09 ENCOUNTER — Telehealth: Payer: Self-pay | Admitting: Internal Medicine

## 2022-08-09 NOTE — Telephone Encounter (Signed)
Left message for patient to call back to schedule Medicare Annual Wellness Visit   Last AWV  08/17/21  Please schedule at anytime with [facility] if patient calls the office back.      Any questions, please call me at [phone]

## 2022-08-15 DIAGNOSIS — M6281 Muscle weakness (generalized): Secondary | ICD-10-CM | POA: Diagnosis not present

## 2022-08-15 DIAGNOSIS — M545 Low back pain, unspecified: Secondary | ICD-10-CM | POA: Diagnosis not present

## 2022-08-23 DIAGNOSIS — M545 Low back pain, unspecified: Secondary | ICD-10-CM | POA: Diagnosis not present

## 2022-08-23 DIAGNOSIS — M6281 Muscle weakness (generalized): Secondary | ICD-10-CM | POA: Diagnosis not present

## 2022-08-26 DIAGNOSIS — M545 Low back pain, unspecified: Secondary | ICD-10-CM | POA: Diagnosis not present

## 2022-08-26 DIAGNOSIS — M6281 Muscle weakness (generalized): Secondary | ICD-10-CM | POA: Diagnosis not present

## 2022-08-30 DIAGNOSIS — M545 Low back pain, unspecified: Secondary | ICD-10-CM | POA: Diagnosis not present

## 2022-08-30 DIAGNOSIS — M6281 Muscle weakness (generalized): Secondary | ICD-10-CM | POA: Diagnosis not present

## 2022-08-31 ENCOUNTER — Other Ambulatory Visit: Payer: Self-pay | Admitting: Internal Medicine

## 2022-09-02 DIAGNOSIS — M545 Low back pain, unspecified: Secondary | ICD-10-CM | POA: Diagnosis not present

## 2022-09-02 DIAGNOSIS — M6281 Muscle weakness (generalized): Secondary | ICD-10-CM | POA: Diagnosis not present

## 2022-09-05 DIAGNOSIS — M545 Low back pain, unspecified: Secondary | ICD-10-CM | POA: Diagnosis not present

## 2022-09-05 DIAGNOSIS — M6281 Muscle weakness (generalized): Secondary | ICD-10-CM | POA: Diagnosis not present

## 2022-09-06 ENCOUNTER — Ambulatory Visit (INDEPENDENT_AMBULATORY_CARE_PROVIDER_SITE_OTHER): Payer: PPO | Admitting: Internal Medicine

## 2022-09-06 ENCOUNTER — Encounter: Payer: Self-pay | Admitting: Internal Medicine

## 2022-09-06 VITALS — BP 130/72 | HR 85 | Temp 97.7°F | Ht 60.0 in | Wt 165.6 lb

## 2022-09-06 DIAGNOSIS — R29898 Other symptoms and signs involving the musculoskeletal system: Secondary | ICD-10-CM

## 2022-09-06 DIAGNOSIS — R739 Hyperglycemia, unspecified: Secondary | ICD-10-CM | POA: Diagnosis not present

## 2022-09-06 DIAGNOSIS — B351 Tinea unguium: Secondary | ICD-10-CM | POA: Diagnosis not present

## 2022-09-06 DIAGNOSIS — R7989 Other specified abnormal findings of blood chemistry: Secondary | ICD-10-CM

## 2022-09-06 LAB — COMPREHENSIVE METABOLIC PANEL
ALT: 61 U/L — ABNORMAL HIGH (ref 0–35)
AST: 59 U/L — ABNORMAL HIGH (ref 0–37)
Albumin: 4.4 g/dL (ref 3.5–5.2)
Alkaline Phosphatase: 72 U/L (ref 39–117)
BUN: 20 mg/dL (ref 6–23)
CO2: 29 mEq/L (ref 19–32)
Calcium: 9.8 mg/dL (ref 8.4–10.5)
Chloride: 101 mEq/L (ref 96–112)
Creatinine, Ser: 0.8 mg/dL (ref 0.40–1.20)
GFR: 71.75 mL/min (ref >60.00)
Glucose, Bld: 109 mg/dL — ABNORMAL HIGH (ref 70–99)
Potassium: 4.1 mEq/L (ref 3.5–5.1)
Sodium: 138 mEq/L (ref 135–145)
Total Bilirubin: 0.8 mg/dL (ref 0.2–1.2)
Total Protein: 7.4 g/dL (ref 6.0–8.3)

## 2022-09-06 LAB — HEMOGLOBIN A1C: Hgb A1c MFr Bld: 5.9 % (ref 4.6–6.5)

## 2022-09-06 NOTE — Progress Notes (Signed)
Subjective:  Patient ID: Alexandra Scott, female    DOB: [birth year]  Age: 76 y.o. MRN: [id number]  CC: Follow-up (3 month f/u)   HPI KYERA FELAN presents for [employer], OA, HTN  Outpatient Medications Prior to Visit  Medication Sig Dispense Refill   amLODipine (NORVASC) 5 MG tablet Take 1 tablet by mouth once daily 90 tablet 1   aspirin 81 MG EC tablet Take 81 mg by mouth at bedtime.     BIOTIN PO Take 1 tablet by mouth at bedtime.     Calcium Carbonate-Vitamin D3 600-400 MG-UNIT TABS Take 1 tablet by mouth 2 (two) times daily.      Cholecalciferol (VITAMIN D3) 50 MCG (2000 UT) capsule Take 1 capsule (2,000 Units total) by mouth daily. 100 capsule 3   ciclopirox (PENLAC) 8 % solution APPLY TOPICALLY AT BEDIME. APPLY OVER NAIL AND SURROUNDING SKIN DAILY 7 mL 0   furosemide (LASIX) 20 MG tablet Take 1-2 tablets (20-40 mg total) by mouth daily as needed. 30 tablet 3   irbesartan (AVAPRO) 300 MG tablet Take 1 tablet by mouth once daily 90 tablet 1   Magnesium 250 MG TABS Take 250 mg by mouth daily.      Multiple Vitamins-Minerals (TH COMPLETE MULTI 50+ PO) Take by mouth.     Omega-3 Fatty Acids (FISH OIL) 1000 MG CAPS Take 1,000 mg by mouth 2 (two) times daily. Reported on 01/11/2016     Polyethyl Glycol-Propyl Glycol (SYSTANE OP) Place 1 drop into both eyes 2 (two) times daily as needed (dry eyes).     Turmeric 500 MG TABS Take 500 mg by mouth 2 (two) times daily.      vitamin C (ASCORBIC ACID) 500 MG tablet Take 500 mg by mouth at bedtime.      ciprofloxacin (CIPRO) 250 MG tablet Take 1 tablet (250 mg total) by mouth 2 (two) times daily. (Patient not taking: Reported on 09/06/2022) 10 tablet 0   No facility-administered medications prior to visit.    ROS: Review of Systems  Constitutional:  Negative for activity change, appetite change, chills, fatigue and unexpected weight change.  HENT:  Negative for congestion, mouth sores and sinus pressure.   Eyes:  Negative for visual  disturbance.  Respiratory:  Negative for cough and chest tightness.   Gastrointestinal:  Negative for abdominal pain and nausea.  Genitourinary:  Negative for difficulty urinating, frequency and vaginal pain.  Musculoskeletal:  Negative for back pain and gait problem.  Skin:  Negative for pallor and rash.  Neurological:  Negative for dizziness, tremors, weakness, numbness and headaches.  Psychiatric/Behavioral:  Negative for confusion and sleep disturbance.     Objective:  BP 130/72 (BP Location: Left Arm)   Pulse 85   Temp 97.7 F (36.5 C) (Oral)   Ht 5' (1.524 m)   Wt 165 lb 9.6 oz (75.1 kg)   SpO2 98%   BMI 32.34 kg/m   BP Readings from Last 3 Encounters:  09/06/22 130/72  06/07/22 120/68  02/28/22 110/78    Wt Readings from Last 3 Encounters:  09/06/22 165 lb 9.6 oz (75.1 kg)  06/07/22 167 lb 6.4 oz (75.9 kg)  02/28/22 166 lb (75.3 kg)    Physical Exam Constitutional:      General: She is not in acute distress.    Appearance: She is well-developed. She is obese.  HENT:     Head: Normocephalic.     Right Ear: External ear normal.     Left  Ear: External ear normal.     Nose: Nose normal.  Eyes:     General:        Right eye: No discharge.        Left eye: No discharge.     Conjunctiva/sclera: Conjunctivae normal.     Pupils: Pupils are equal, round, and reactive to light.  Neck:     Thyroid: No thyromegaly.     Vascular: No JVD.     Trachea: No tracheal deviation.  Cardiovascular:     Rate and Rhythm: Normal rate and regular rhythm.     Heart sounds: Normal heart sounds.  Pulmonary:     Effort: No respiratory distress.     Breath sounds: No stridor. No wheezing.  Abdominal:     General: Bowel sounds are normal. There is no distension.     Palpations: Abdomen is soft. There is no mass.     Tenderness: There is no abdominal tenderness. There is no guarding or rebound.  Musculoskeletal:        General: No tenderness.     Cervical back: Normal range of  motion and neck supple. No rigidity.  Lymphadenopathy:     Cervical: No cervical adenopathy.  Skin:    Findings: No erythema or rash.  Neurological:     Cranial Nerves: No cranial nerve deficit.     Motor: No abnormal muscle tone.     Coordination: Coordination normal.     Deep Tendon Reflexes: Reflexes normal.  Psychiatric:        Behavior: Behavior normal.        Thought Content: Thought content normal.        Judgment: Judgment normal.     Lab Results  Component Value Date   WBC 7.8 08/27/2021   HGB 12.8 08/27/2021   HCT 37.8 08/27/2021   PLT 248.0 08/27/2021   GLUCOSE 102 (H) 06/07/2022   CHOL 147 02/07/2018   TRIG 70.0 02/07/2018   HDL 44.30 02/07/2018   LDLDIRECT 150.6 10/15/2007   LDLCALC 89 02/07/2018   ALT 55 (H) 06/07/2022   AST 60 (H) 06/07/2022   NA 137 06/07/2022   K 4.2 06/07/2022   CL 100 06/07/2022   CREATININE 0.84 06/07/2022   BUN 17 06/07/2022   CO2 30 06/07/2022   TSH 2.73 02/28/2022   INR 1.0 02/23/2021   HGBA1C 5.9 06/07/2022    US Abdomen Limited RUQ (LIVER/GB)  Result Date: 09/14/2021 CLINICAL DATA:  Elevated liver function tests.  Thanks EXAM: ULTRASOUND ABDOMEN LIMITED RIGHT UPPER QUADRANT COMPARISON:  CT 01/23/2017 FINDINGS: Gallbladder: No gallstones or wall thickening visualized. No sonographic Murphy sign noted by sonographer. Common bile duct: Diameter: 2.8 mm Liver: Diffusely increased liver echogenicity. Portal vein is patent on color Doppler imaging with normal direction of blood flow towards the liver. Other: None. IMPRESSION: Hepatic steatosis. Unremarkable gallbladder and biliary ducts. Electronically Signed   By: Maurine Simmering M.D.   On: 09/14/2021 15:35    Assessment & Plan:   Problem List Items Addressed This Visit     Onychomycosis - Primary    Better - slow improvement Cont w/Penlac Oral Rx options were discussed      Leg weakness, bilateral    F/u ortho PT offered      Hyperglycemia    Mild Loose wt       Relevant Orders   Comprehensive metabolic panel   Hemoglobin A1c   Elevated LFTs    Wt Readings from Last 3 Encounters:  09/06/22  165 lb 9.6 oz (75.1 kg)  06/07/22 167 lb 6.4 oz (75.9 kg)  02/28/22 166 lb (75.3 kg)  Cont w/wt loss Milk thistle po suggested      Relevant Orders   Comprehensive metabolic panel   Hemoglobin A1c      No orders of the defined types were placed in this encounter.     Follow-up: Return in about 3 months (around 12/06/2022) for a follow-up visit.  Walker Kehr, MD

## 2022-09-06 NOTE — Assessment & Plan Note (Signed)
F/u ortho PT offered

## 2022-09-06 NOTE — Assessment & Plan Note (Signed)
Mild Loose wt

## 2022-09-06 NOTE — Assessment & Plan Note (Signed)
Better - slow improvement Cont w/Penlac Oral Rx options were discussed

## 2022-09-06 NOTE — Assessment & Plan Note (Signed)
Wt Readings from Last 3 Encounters:  09/06/22 165 lb 9.6 oz (75.1 kg)  06/07/22 167 lb 6.4 oz (75.9 kg)  02/28/22 166 lb (75.3 kg)  Cont w/wt loss Milk thistle po suggested

## 2022-09-09 DIAGNOSIS — M6281 Muscle weakness (generalized): Secondary | ICD-10-CM | POA: Diagnosis not present

## 2022-09-09 DIAGNOSIS — M545 Low back pain, unspecified: Secondary | ICD-10-CM | POA: Diagnosis not present

## 2022-09-14 DIAGNOSIS — M545 Low back pain, unspecified: Secondary | ICD-10-CM | POA: Diagnosis not present

## 2022-09-14 DIAGNOSIS — M6281 Muscle weakness (generalized): Secondary | ICD-10-CM | POA: Diagnosis not present

## 2022-09-16 DIAGNOSIS — M6281 Muscle weakness (generalized): Secondary | ICD-10-CM | POA: Diagnosis not present

## 2022-09-16 DIAGNOSIS — M545 Low back pain, unspecified: Secondary | ICD-10-CM | POA: Diagnosis not present

## 2022-09-20 DIAGNOSIS — M6281 Muscle weakness (generalized): Secondary | ICD-10-CM | POA: Diagnosis not present

## 2022-09-20 DIAGNOSIS — M545 Low back pain, unspecified: Secondary | ICD-10-CM | POA: Diagnosis not present

## 2022-09-27 DIAGNOSIS — M545 Low back pain, unspecified: Secondary | ICD-10-CM | POA: Diagnosis not present

## 2022-09-27 DIAGNOSIS — M6281 Muscle weakness (generalized): Secondary | ICD-10-CM | POA: Diagnosis not present

## 2022-09-30 DIAGNOSIS — M6281 Muscle weakness (generalized): Secondary | ICD-10-CM | POA: Diagnosis not present

## 2022-09-30 DIAGNOSIS — M545 Low back pain, unspecified: Secondary | ICD-10-CM | POA: Diagnosis not present

## 2022-10-04 DIAGNOSIS — M545 Low back pain, unspecified: Secondary | ICD-10-CM | POA: Diagnosis not present

## 2022-10-04 DIAGNOSIS — M6281 Muscle weakness (generalized): Secondary | ICD-10-CM | POA: Diagnosis not present

## 2022-10-07 ENCOUNTER — Other Ambulatory Visit: Payer: Self-pay | Admitting: Internal Medicine

## 2022-10-07 DIAGNOSIS — M6281 Muscle weakness (generalized): Secondary | ICD-10-CM | POA: Diagnosis not present

## 2022-10-07 DIAGNOSIS — M545 Low back pain, unspecified: Secondary | ICD-10-CM | POA: Diagnosis not present

## 2022-10-11 DIAGNOSIS — M6281 Muscle weakness (generalized): Secondary | ICD-10-CM | POA: Diagnosis not present

## 2022-10-11 DIAGNOSIS — M545 Low back pain, unspecified: Secondary | ICD-10-CM | POA: Diagnosis not present

## 2022-10-14 DIAGNOSIS — M545 Low back pain, unspecified: Secondary | ICD-10-CM | POA: Diagnosis not present

## 2022-10-14 DIAGNOSIS — M6281 Muscle weakness (generalized): Secondary | ICD-10-CM | POA: Diagnosis not present

## 2022-10-18 DIAGNOSIS — M6281 Muscle weakness (generalized): Secondary | ICD-10-CM | POA: Diagnosis not present

## 2022-10-18 DIAGNOSIS — M545 Low back pain, unspecified: Secondary | ICD-10-CM | POA: Diagnosis not present

## 2022-10-20 DIAGNOSIS — M6281 Muscle weakness (generalized): Secondary | ICD-10-CM | POA: Diagnosis not present

## 2022-10-20 DIAGNOSIS — M545 Low back pain, unspecified: Secondary | ICD-10-CM | POA: Diagnosis not present

## 2022-10-21 ENCOUNTER — Other Ambulatory Visit: Payer: Self-pay | Admitting: Internal Medicine

## 2022-10-25 DIAGNOSIS — M6281 Muscle weakness (generalized): Secondary | ICD-10-CM | POA: Diagnosis not present

## 2022-10-25 DIAGNOSIS — M545 Low back pain, unspecified: Secondary | ICD-10-CM | POA: Diagnosis not present

## 2022-10-27 DIAGNOSIS — M545 Low back pain, unspecified: Secondary | ICD-10-CM | POA: Diagnosis not present

## 2022-10-27 DIAGNOSIS — M6281 Muscle weakness (generalized): Secondary | ICD-10-CM | POA: Diagnosis not present

## 2022-12-05 DIAGNOSIS — Z1231 Encounter for screening mammogram for malignant neoplasm of breast: Secondary | ICD-10-CM | POA: Diagnosis not present

## 2022-12-06 ENCOUNTER — Encounter: Payer: Self-pay | Admitting: Internal Medicine

## 2022-12-06 ENCOUNTER — Ambulatory Visit (INDEPENDENT_AMBULATORY_CARE_PROVIDER_SITE_OTHER): Payer: PPO | Admitting: Internal Medicine

## 2022-12-06 ENCOUNTER — Ambulatory Visit (INDEPENDENT_AMBULATORY_CARE_PROVIDER_SITE_OTHER): Payer: PPO

## 2022-12-06 VITALS — BP 130/70 | HR 85 | Temp 98.6°F | Ht 60.0 in | Wt 166.0 lb

## 2022-12-06 DIAGNOSIS — M5440 Lumbago with sciatica, unspecified side: Secondary | ICD-10-CM

## 2022-12-06 DIAGNOSIS — Z0000 Encounter for general adult medical examination without abnormal findings: Secondary | ICD-10-CM | POA: Diagnosis not present

## 2022-12-06 DIAGNOSIS — F419 Anxiety disorder, unspecified: Secondary | ICD-10-CM

## 2022-12-06 DIAGNOSIS — R140 Abdominal distension (gaseous): Secondary | ICD-10-CM | POA: Diagnosis not present

## 2022-12-06 DIAGNOSIS — K760 Fatty (change of) liver, not elsewhere classified: Secondary | ICD-10-CM

## 2022-12-06 DIAGNOSIS — F439 Reaction to severe stress, unspecified: Secondary | ICD-10-CM

## 2022-12-06 LAB — COMPREHENSIVE METABOLIC PANEL
ALT: 46 U/L — ABNORMAL HIGH (ref 0–35)
AST: 48 U/L — ABNORMAL HIGH (ref 0–37)
Albumin: 4.3 g/dL (ref 3.5–5.2)
Alkaline Phosphatase: 72 U/L (ref 39–117)
BUN: 15 mg/dL (ref 6–23)
CO2: 30 mEq/L (ref 19–32)
Calcium: 10.1 mg/dL (ref 8.4–10.5)
Chloride: 99 mEq/L (ref 96–112)
Creatinine, Ser: 0.71 mg/dL (ref 0.40–1.20)
GFR: 82.66 mL/min (ref >60.00)
Glucose, Bld: 108 mg/dL — ABNORMAL HIGH (ref 70–99)
Potassium: 4.2 mEq/L (ref 3.5–5.1)
Sodium: 138 mEq/L (ref 135–145)
Total Bilirubin: 0.9 mg/dL (ref 0.2–1.2)
Total Protein: 7.5 g/dL (ref 6.0–8.3)

## 2022-12-06 MED ORDER — METRONIDAZOLE 500 MG PO TABS: 500.0000 mg | ORAL_TABLET | Freq: Three times a day (TID) | ORAL | 0 refills | Status: AC

## 2022-12-06 MED ORDER — ALIGN 4 MG PO CAPS: 1.0000 | ORAL_CAPSULE | Freq: Every day | ORAL | 1 refills | Status: AC

## 2022-12-06 NOTE — Patient Instructions (Signed)
[name] , Thank you for taking time to come for your Medicare Wellness Visit. I appreciate your ongoing commitment to your health goals. Please review the following plan we discussed and let me know if I can assist you in the future.   These are the goals we discussed:  Goals      My healthcare goal for 2024 is to maintain my current health status by continuing to eat healthy, stay independent, physically and socially active.        This is a list of the screening recommended for you and due dates:  Health Maintenance  Topic Date Due   Hepatitis C Screening: USPSTF Recommendation to screen - Ages 32-79 yo.  Never done   Zoster (Shingles) Vaccine (1 of 2) Never done   Medicare Annual Wellness Visit  12/06/2023   DTaP/Tdap/Td vaccine (3 - Td or Tdap) 02/24/2031   Pneumonia Vaccine  Completed   Flu Shot  Completed   DEXA scan (bone density measurement)  Completed   HPV Vaccine  Aged Out   Colon Cancer Screening  Discontinued   COVID-19 Vaccine  Discontinued    Advanced directives: No  Conditions/risks identified: Yes  Next appointment: Follow up in one year for your annual wellness visit.   Preventive Care 40 Years and Older, Female Preventive care refers to lifestyle choices and visits with your health care provider that can promote health and wellness. What does preventive care include? A yearly physical exam. This is also called an annual well check. Dental exams once or twice a year. Routine eye exams. Ask your health care provider how often you should have your eyes checked. Personal lifestyle choices, including: Daily care of your teeth and gums. Regular physical activity. Eating a healthy diet. Avoiding tobacco and drug use. Limiting alcohol use. Practicing safe sex. Taking low-dose aspirin every day. Taking vitamin and mineral supplements as recommended by your health care provider. What happens during an annual well check? The services and screenings done by  your health care provider during your annual well check will depend on your age, overall health, lifestyle risk factors, and family history of disease. Counseling  Your health care provider may ask you questions about your: Alcohol use. Tobacco use. Drug use. Emotional well-being. Home and relationship well-being. Sexual activity. Eating habits. History of falls. Memory and ability to understand (cognition). Work and work Statistician. Reproductive health. Screening  You may have the following tests or measurements: Height, weight, and BMI. Blood pressure. Lipid and cholesterol levels. These may be checked every 5 years, or more frequently if you are over 81 years old. Skin check. Lung cancer screening. You may have this screening every year starting at age 43 if you have a 30-pack-year history of smoking and currently smoke or have quit within the past 15 years. Fecal occult blood test (FOBT) of the stool. You may have this test every year starting at age 78. Flexible sigmoidoscopy or colonoscopy. You may have a sigmoidoscopy every 5 years or a colonoscopy every 10 years starting at age 13. Hepatitis C blood test. Hepatitis B blood test. Sexually transmitted disease (STD) testing. Diabetes screening. This is done by checking your blood sugar (glucose) after you have not eaten for a while (fasting). You may have this done every 1-3 years. Bone density scan. This is done to screen for osteoporosis. You may have this done starting at age 51. Mammogram. This may be done every 1-2 years. Talk to your health care provider about how  often you should have regular mammograms. Talk with your health care provider about your test results, treatment options, and if necessary, the need for more tests. Vaccines  Your health care provider may recommend certain vaccines, such as: Influenza vaccine. This is recommended every year. Tetanus, diphtheria, and acellular pertussis (Tdap, Td) vaccine. You  may need a Td booster every 10 years. Zoster vaccine. You may need this after age 16. Pneumococcal 13-valent conjugate (PCV13) vaccine. One dose is recommended after age 51. Pneumococcal polysaccharide (PPSV23) vaccine. One dose is recommended after age 57. Talk to your health care provider about which screenings and vaccines you need and how often you need them. This information is not intended to replace advice given to you by your health care provider. Make sure you discuss any questions you have with your health care provider. Document Released: 10/09/2015 Document Revised: 06/01/2016 Document Reviewed: 07/14/2015 Elsevier Interactive Patient Education  2017 [employer] Prevention in the Home Falls can cause injuries. They can happen to people of all ages. There are many things you can do to make your home safe and to help prevent falls. What can I do on the outside of my home? Regularly fix the edges of walkways and driveways and fix any cracks. Remove anything that might make you trip as you walk through a door, such as a raised step or threshold. Trim any bushes or trees on the path to your home. Use bright outdoor lighting. Clear any walking paths of anything that might make someone trip, such as rocks or tools. Regularly check to see if handrails are loose or broken. Make sure that both sides of any steps have handrails. Any raised decks and porches should have guardrails on the edges. Have any leaves, snow, or ice cleared regularly. Use sand or salt on walking paths during winter. Clean up any spills in your garage right away. This includes oil or grease spills. What can I do in the bathroom? Use night lights. Install grab bars by the toilet and in the tub and shower. Do not use towel bars as grab bars. Use non-skid mats or decals in the tub or shower. If you need to sit down in the shower, use a plastic, non-slip stool. Keep the floor dry. Clean up any water that spills  on the floor as soon as it happens. Remove soap buildup in the tub or shower regularly. Attach bath mats securely with double-sided non-slip rug tape. Do not have throw rugs and other things on the floor that can make you trip. What can I do in the bedroom? Use night lights. Make sure that you have a light by your bed that is easy to reach. Do not use any sheets or blankets that are too big for your bed. They should not hang down onto the floor. Have a firm chair that has side arms. You can use this for support while you get dressed. Do not have throw rugs and other things on the floor that can make you trip. What can I do in the kitchen? Clean up any spills right away. Avoid walking on wet floors. Keep items that you use a lot in easy-to-reach places. If you need to reach something above you, use a strong step stool that has a grab bar. Keep electrical cords out of the way. Do not use floor polish or wax that makes floors slippery. If you must use wax, use non-skid floor wax. Do not have throw rugs and other things  on the floor that can make you trip. What can I do with my stairs? Do not leave any items on the stairs. Make sure that there are handrails on both sides of the stairs and use them. Fix handrails that are broken or loose. Make sure that handrails are as long as the stairways. Check any carpeting to make sure that it is firmly attached to the stairs. Fix any carpet that is loose or worn. Avoid having throw rugs at the top or bottom of the stairs. If you do have throw rugs, attach them to the floor with carpet tape. Make sure that you have a light switch at the top of the stairs and the bottom of the stairs. If you do not have them, ask someone to add them for you. What else can I do to help prevent falls? Wear shoes that: Do not have high heels. Have rubber bottoms. Are comfortable and fit you well. Are closed at the toe. Do not wear sandals. If you use a stepladder: Make  sure that it is fully opened. Do not climb a closed stepladder. Make sure that both sides of the stepladder are locked into place. Ask someone to hold it for you, if possible. Clearly mark and make sure that you can see: Any grab bars or handrails. First and last steps. Where the edge of each step is. Use tools that help you move around (mobility aids) if they are needed. These include: Canes. Walkers. Scooters. Crutches. Turn on the lights when you go into a dark area. Replace any light bulbs as soon as they burn out. Set up your furniture so you have a clear path. Avoid moving your furniture around. If any of your floors are uneven, fix them. If there are any pets around you, be aware of where they are. Review your medicines with your doctor. Some medicines can make you feel dizzy. This can increase your chance of falling. Ask your doctor what other things that you can do to help prevent falls. This information is not intended to replace advice given to you by your health care provider. Make sure you discuss any questions you have with your health care provider. Document Released: 07/09/2009 Document Revised: 02/18/2016 Document Reviewed: 10/17/2014 Elsevier Interactive Patient Education  2017 [employer].

## 2022-12-06 NOTE — Progress Notes (Signed)
Subjective:  Patient ID: Alexandra Scott, female    DOB: 11-Jun-1946  Age: 77 y.o. MRN: [id number]  CC: Follow-up (3 MNTH FOLLOW UP)   HPI Alexandra Scott presents for belching, gassy x 3 moths F/u OA, HTN, R hip pain  Husband fell and broke his femur in 08/2022  Outpatient Medications Prior to Visit  Medication Sig Dispense Refill   amLODipine (NORVASC) 5 MG tablet Take 1 tablet by mouth once daily 90 tablet 1   aspirin 81 MG EC tablet Take 81 mg by mouth at bedtime.     BIOTIN PO Take 1 tablet by mouth at bedtime.     Calcium Carbonate-Vitamin D3 600-400 MG-UNIT TABS Take 1 tablet by mouth 2 (two) times daily.      Cholecalciferol (VITAMIN D3) 50 MCG (2000 UT) capsule Take 1 capsule (2,000 Units total) by mouth daily. 100 capsule 3   ciclopirox (PENLAC) 8 % solution APPLY 1 APPLICATION OF SOLUTION TOPICALLY AT BEDTIME. APPLY OVER NAIL AND SURROUNDING SKIN DAILY 7 mL 0   furosemide (LASIX) 20 MG tablet Take 1-2 tablets (20-40 mg total) by mouth daily as needed. 30 tablet 3   irbesartan (AVAPRO) 300 MG tablet Take 1 tablet by mouth once daily 90 tablet 1   Magnesium 250 MG TABS Take 250 mg by mouth daily.      Multiple Vitamins-Minerals (TH COMPLETE MULTI 50+ PO) Take by mouth.     Omega-3 Fatty Acids (FISH OIL) 1000 MG CAPS Take 1,000 mg by mouth 2 (two) times daily. Reported on 01/11/2016     Polyethyl Glycol-Propyl Glycol (SYSTANE OP) Place 1 drop into both eyes 2 (two) times daily as needed (dry eyes).     Turmeric 500 MG TABS Take 500 mg by mouth 2 (two) times daily.      vitamin C (ASCORBIC ACID) 500 MG tablet Take 500 mg by mouth at bedtime.      No facility-administered medications prior to visit.    ROS: Review of Systems  Constitutional:  Negative for activity change, appetite change, chills, fatigue and unexpected weight change.  HENT:  Negative for congestion, mouth sores and sinus pressure.   Eyes:  Negative for visual disturbance.  Respiratory:  Negative for  cough and chest tightness.   Gastrointestinal:  Positive for abdominal distention. Negative for abdominal pain, blood in stool, nausea and vomiting.  Genitourinary:  Negative for difficulty urinating, frequency and vaginal pain.  Musculoskeletal:  Positive for arthralgias. Negative for back pain and gait problem.  Skin:  Negative for pallor and rash.  Neurological:  Negative for dizziness, tremors, weakness, numbness and headaches.  Psychiatric/Behavioral:  Negative for confusion, sleep disturbance and suicidal ideas.     Objective:  BP 130/70 (BP Location: Left Arm, Patient Position: Sitting, Cuff Size: Normal)   Pulse 85   Temp 98.6 F (37 C) (Oral)   Ht 5' (1.524 m)   Wt 166 lb (75.3 kg)   SpO2 95%   BMI 32.42 kg/m   BP Readings from Last 3 Encounters:  12/06/22 130/70  12/06/22 130/70  09/06/22 130/72    Wt Readings from Last 3 Encounters:  12/06/22 166 lb (75.3 kg)  12/06/22 166 lb (75.3 kg)  09/06/22 165 lb 9.6 oz (75.1 kg)    Physical Exam Constitutional:      General: She is not in acute distress.    Appearance: She is well-developed.  HENT:     Head: Normocephalic.     Right Ear: External ear  normal.     Left Ear: External ear normal.     Nose: Nose normal.  Eyes:     General:        Right eye: No discharge.        Left eye: No discharge.     Conjunctiva/sclera: Conjunctivae normal.     Pupils: Pupils are equal, round, and reactive to light.  Neck:     Thyroid: No thyromegaly.     Vascular: No JVD.     Trachea: No tracheal deviation.  Cardiovascular:     Rate and Rhythm: Normal rate and regular rhythm.     Heart sounds: Normal heart sounds.  Pulmonary:     Effort: No respiratory distress.     Breath sounds: No stridor. No wheezing.  Abdominal:     General: Bowel sounds are normal. There is no distension.     Palpations: Abdomen is soft. There is no mass.     Tenderness: There is no abdominal tenderness. There is no guarding or rebound.   Musculoskeletal:        General: No tenderness.     Cervical back: Normal range of motion and neck supple. No rigidity.  Lymphadenopathy:     Cervical: No cervical adenopathy.  Skin:    Findings: No erythema or rash.  Neurological:     Cranial Nerves: No cranial nerve deficit.     Motor: No abnormal muscle tone.     Coordination: Coordination normal.     Gait: Gait abnormal.     Deep Tendon Reflexes: Reflexes normal.  Psychiatric:        Behavior: Behavior normal.        Thought Content: Thought content normal.        Judgment: Judgment normal.     Lab Results  Component Value Date   WBC 7.8 08/27/2021   HGB 12.8 08/27/2021   HCT 37.8 08/27/2021   PLT 248.0 08/27/2021   GLUCOSE 109 (H) 09/06/2022   CHOL 147 02/07/2018   TRIG 70.0 02/07/2018   HDL 44.30 02/07/2018   LDLDIRECT 150.6 10/15/2007   LDLCALC 89 02/07/2018   ALT 61 (H) 09/06/2022   AST 59 (H) 09/06/2022   NA 138 09/06/2022   K 4.1 09/06/2022   CL 101 09/06/2022   CREATININE 0.80 09/06/2022   BUN 20 09/06/2022   CO2 29 09/06/2022   TSH 2.73 02/28/2022   INR 1.0 02/23/2021   HGBA1C 5.9 09/06/2022    US Abdomen Limited RUQ (LIVER/GB)  Result Date: 09/14/2021 CLINICAL DATA:  Elevated liver function tests.  Thanks EXAM: ULTRASOUND ABDOMEN LIMITED RIGHT UPPER QUADRANT COMPARISON:  CT 01/23/2017 FINDINGS: Gallbladder: No gallstones or wall thickening visualized. No sonographic Murphy sign noted by sonographer. Common bile duct: Diameter: 2.8 mm Liver: Diffusely increased liver echogenicity. Portal vein is patent on color Doppler imaging with normal direction of blood flow towards the liver. Other: None. IMPRESSION: Hepatic steatosis. Unremarkable gallbladder and biliary ducts. Electronically Signed   By: Maurine Simmering M.D.   On: 09/14/2021 15:35    Assessment & Plan:   Problem List Items Addressed This Visit       Digestive   Fatty liver - Primary    Repeat LFTs, loose wt, cut back on sweets.  We can use  diabetic medicines like metformin, Actos, Rybelsus to help to improve  fatty liver. On milk thistle capsules      Relevant Orders   Comprehensive metabolic panel     Other   Well adult  exam   Stress at home    Husband fell and broke his femur in 08/2022 GD with leukemia since 2017 at 77 yo, now is 9      Meteorism    S/p colonoscopy 2022 Flagyl x 10 d, then Align x 2-4 weeks      Relevant Orders   Comprehensive metabolic panel   LOW BACK PAIN    On Vit D      Anxiety disorder    Continue w/Xanax prn rare use  Potential benefits of a long term opioids use as well as potential risks (i.e. addiction risk, apnea etc) and complications (i.e. Somnolence, constipation and others) were explained to the patient and were aknowledged.         Meds ordered this encounter  Medications   metroNIDAZOLE (FLAGYL) 500 MG tablet    Sig: Take 1 tablet (500 mg total) by mouth 3 (three) times daily.    Dispense:  30 tablet    Refill:  0   Probiotic Product (ALIGN) 4 MG CAPS    Sig: Take 1 capsule (4 mg total) by mouth daily.    Dispense:  30 capsule    Refill:  1      Follow-up: Return in about 4 months (around 04/07/2023) for a follow-up visit.  Walker Kehr, MD

## 2022-12-06 NOTE — Assessment & Plan Note (Signed)
Repeat LFTs, loose wt, cut back on sweets.  We can use diabetic medicines like metformin, Actos, Rybelsus to help to improve  fatty liver. On milk thistle capsules

## 2022-12-06 NOTE — Progress Notes (Addendum)
Subjective:   Alexandra Scott is a 77 y.o. female who presents for Medicare Annual (Subsequent) preventive examination.  Review of Systems     Cardiac Risk Factors include: advanced age (>74mn, >>39women);dyslipidemia;family history of premature cardiovascular disease;hypertension;obesity (BMI >30kg/m2)     Objective:    Today's Vitals   12/06/22 1000  BP: 130/70  Pulse: 85  Temp: 98.6 F (37 C)  TempSrc: Oral  SpO2: 95%  Weight: 166 lb (75.3 kg)  Height: 5' (1.524 m)  PainSc: 0-No pain   Body mass index is 32.42 kg/m.     12/06/2022   10:03 AM 08/17/2021    1:04 PM 03/30/2021    9:18 AM 03/12/2021   11:00 AM 04/23/2020    9:20 AM 10/08/2018    9:57 AM 02/12/2018    8:48 AM  Advanced Directives  Does Patient Have a Medical Advance Directive? No No No No No No No  Does patient want to make changes to medical advance directive?  No - Patient declined       Would patient like information on creating a medical advance directive? No - Patient declined  No - Patient declined No - Patient declined No - Patient declined Yes (ED - Information included in AVS) No - Patient declined    Current Medications (verified) Outpatient Encounter Medications as of 12/06/2022  Medication Sig   amLODipine (NORVASC) 5 MG tablet Take 1 tablet by mouth once daily   aspirin 81 MG EC tablet Take 81 mg by mouth at bedtime.   BIOTIN PO Take 1 tablet by mouth at bedtime.   Calcium Carbonate-Vitamin D3 600-400 MG-UNIT TABS Take 1 tablet by mouth 2 (two) times daily.    Cholecalciferol (VITAMIN D3) 50 MCG (2000 UT) capsule Take 1 capsule (2,000 Units total) by mouth daily.   ciclopirox (PENLAC) 8 % solution APPLY 1 APPLICATION OF SOLUTION TOPICALLY AT BEDTIME. APPLY OVER NAIL AND SURROUNDING SKIN DAILY   furosemide (LASIX) 20 MG tablet Take 1-2 tablets (20-40 mg total) by mouth daily as needed.   irbesartan (AVAPRO) 300 MG tablet Take 1 tablet by mouth once daily   Magnesium 250 MG TABS Take 250 mg  by mouth daily.    Multiple Vitamins-Minerals (TH COMPLETE MULTI 50+ PO) Take by mouth.   Omega-3 Fatty Acids (FISH OIL) 1000 MG CAPS Take 1,000 mg by mouth 2 (two) times daily. Reported on 01/11/2016   Polyethyl Glycol-Propyl Glycol (SYSTANE OP) Place 1 drop into both eyes 2 (two) times daily as needed (dry eyes).   Turmeric 500 MG TABS Take 500 mg by mouth 2 (two) times daily.    vitamin C (ASCORBIC ACID) 500 MG tablet Take 500 mg by mouth at bedtime.    No facility-administered encounter medications on file as of 12/06/2022.    Allergies (verified) Augmentin [amoxicillin-pot clavulanate], Cortisone, Hydrocortisone, Maxzide [hydrochlorothiazide w-triamterene], and Prednisone   History: Past Medical History:  Diagnosis Date   Breast cyst 09/26/2006   Left / SER   Colon polyps    Constipation    GERD (gastroesophageal reflux disease)    uses OTC   LBP (low back pain)    ? Spinal stenosis   Osteoarthritis    Osteoarthritis of hip 09/26/2009   Right/Dr Fields   Past Surgical History:  Procedure Laterality Date   BREAST DUCTAL SYSTEM EXCISION Left 05/22/2014   Procedure: LEFT NIPPLE DUCT EXCISION;  Surgeon: MImogene Burn TGeorgette Dover MD;  [facility] [facility]  Service: General;  Laterality: Left;  BREAST DUCTAL SYSTEM EXCISION Right 03/30/2021   Procedure: RIGHT NIPPLE DUCT EXCISION;  Surgeon: Donnie Mesa, MD;  Location: [place];  Service: General;  Laterality: Right;   COLONOSCOPY     COLONOSCOPY W/ BIOPSIES AND POLYPECTOMY     JOINT REPLACEMENT  7/12   R THR   POLYPECTOMY     Family History  Problem Relation Age of Onset   Colon cancer Maternal Uncle    Hypertension Other    Breast cancer Other    Esophageal cancer Neg Hx    Rectal cancer Neg Hx    Stomach cancer Neg Hx    Social History   Socioeconomic History   Marital status: Married    Spouse name: Not on file   Number of children: Not on file   Years of education: Not on file   Highest education level:  Not on file  Occupational History   Occupation: Museum/gallery conservator: self employed  Tobacco Use   Smoking status: Never   Smokeless tobacco: Never  Vaping Use   Vaping Use: Never used  Substance and Sexual Activity   Alcohol use: No    Alcohol/week: 0.0 standard drinks of alcohol   Drug use: No   Sexual activity: Not Currently    Birth control/protection: Post-menopausal  Other Topics Concern   Not on file  Social History Narrative   Not on file   Social Determinants of Health   Financial Resource Strain: Low Risk  (12/06/2022)   Overall Financial Resource Strain (CARDIA)    Difficulty of Paying Living Expenses: Not hard at all  Food Insecurity: No Food Insecurity (12/06/2022)   Hunger Vital Sign    Worried About Running Out of Food in the Last Year: Never true    Ran Out of Food in the Last Year: Never true  Transportation Needs: No Transportation Needs (12/06/2022)   PRAPARE - Hydrologist (Medical): No    Lack of Transportation (Non-Medical): No  Physical Activity: Sufficiently Active (12/06/2022)   Exercise Vital Sign    Days of Exercise per Week: 5 days    Minutes of Exercise per Session: 30 min  Stress: No Stress Concern Present (12/06/2022)   [place]    Feeling of Stress : Not at all  Social Connections: [place] (12/06/2022)   Social Connection and Isolation Panel [NHANES]    Frequency of Communication with Friends and Family: More than three times a week    Frequency of Social Gatherings with Friends and Family: More than three times a week    Attends Religious Services: More than 4 times per year    Active Member of [employer] or Organizations: Yes    Attends Music therapist: More than 4 times per year    Marital Status: Married    Tobacco Counseling Counseling given: Not Answered   Clinical Intake:  Pre-visit preparation completed:  Yes  Pain : No/denies pain Pain Score: 0-No pain     BMI - recorded: 32.42 Nutritional Status: BMI > 30  Obese Nutritional Risks: None Diabetes: No  How often do you need to have someone help you when you read instructions, pamphlets, or other written materials from your doctor or pharmacy?: 1 - Never What is the last grade level you completed in school?: HSG  Diabetic? No  Interpreter Needed?: No  Information entered by :: Lisette Abu, LPN.   Activities of  Daily Living    12/06/2022   10:04 AM  In your present state of health, do you have any difficulty performing the following activities:  Hearing? 0  Vision? 0  Difficulty concentrating or making decisions? 0  Walking or climbing stairs? 0  Dressing or bathing? 0  Doing errands, shopping? 0  Preparing Food and eating ? N  Using the Toilet? N  In the past six months, have you accidently leaked urine? N  Do you have problems with loss of bowel control? N  Managing your Medications? N  Managing your Finances? N  Housekeeping or managing your Housekeeping? N    Patient Care Team: Plotnikov, Evie Lacks, MD as PCP - General Barbaraann Rondo, MD (Obstetrics and Gynecology) Ladene Artist, MD (Gastroenterology) Gaynelle Arabian, MD (Orthopedic Surgery) Donnie Mesa, MD as Consulting Physician (General Surgery)  Indicate any recent Medical Services you may have received from other than [facility] providers in the past year (date may be approximate).     Assessment:   This is a routine wellness examination for Jacquetta.  Hearing/Vision screen Hearing Screening - Comments:: Denies hearing difficulties   Vision Screening - Comments:: Wears rx glasses - up to date with routine eye exams with [facility]   Dietary issues and exercise activities discussed: Current Exercise Habits: Home exercise routine, Type of exercise: walking;Other - see comments (chair exercises, PT exercises for the back), Time (Minutes): 30,  Frequency (Times/Week): 5, Weekly Exercise (Minutes/Week): 150, Intensity: Mild, Exercise limited by: orthopedic condition(s);respiratory conditions(s);neurologic condition(s)   Goals Addressed             This Visit's Progress    My healthcare goal for 2024 is to maintain my current health status by continuing to eat healthy, stay independent, physically and socially active.        Depression Screen    12/06/2022   10:02 AM 12/06/2022    9:01 AM 09/06/2022    9:38 AM 06/07/2022    9:41 AM 08/17/2021    1:09 PM 04/23/2020    9:20 AM 08/14/2019    9:17 AM  PHQ 2/9 Scores  PHQ - 2 Score 0 0 0 0 0 0 1  PHQ- 9 Score 0  0 2       Fall Risk    12/06/2022    9:01 AM 09/06/2022    9:38 AM 06/07/2022    9:41 AM 08/17/2021    1:06 PM 04/23/2020    9:20 AM  Fall Risk   Falls in the past year? 0 0 0 0 0  Number falls in past yr: 0 0 0 0 0  Injury with Fall? 0 0 0 0 0  Risk for fall due to : No Fall Risks No Fall Risks No Fall Risks No Fall Risks Impaired balance/gait  Follow up Falls evaluation completed   Falls evaluation completed Falls evaluation completed    FALL RISK PREVENTION PERTAINING TO THE HOME:  Any stairs in or around the home? No  If so, are there any without handrails? No  Home free of loose throw rugs in walkways, pet beds, electrical cords, etc? Yes  Adequate lighting in your home to reduce risk of falls? Yes   ASSISTIVE DEVICES UTILIZED TO PREVENT FALLS:  Life alert? No  Use of a cane, walker or w/c? No  Grab bars in the bathroom? No  Shower chair or bench in shower? Yes  Elevated toilet seat or a handicapped toilet? Yes   TIMED UP  AND GO:  Was the test performed? Yes .  Length of time to ambulate 10 feet: 8 sec.   Gait steady and fast without use of assistive device  Cognitive Function:        12/06/2022   10:05 AM 04/23/2020    9:22 AM  6CIT Screen  What Year? 0 points 0 points  What month? 0 points 0 points  What time? 0 points 0 points   Count back from 20 0 points 0 points  Months in reverse 0 points 0 points  Repeat phrase 0 points 0 points  Total Score 0 points 0 points    Immunizations Immunization History  Administered Date(s) Administered   Fluad Quad(high Dose 65+) 05/22/2019, 08/14/2020, 07/20/2021   Influenza, High Dose Seasonal PF 07/10/2022   Influenza,inj,Quad PF,6+ Mos 06/30/2014, 06/30/2015   Influenza-Unspecified 06/27/2015   Pneumococcal Conjugate-13 01/11/2016   Pneumococcal Polysaccharide-23 02/06/2018   Td 11/19/2010   Tdap 02/23/2021    TDAP status: Up to date  Flu Vaccine status: Up to date  Pneumococcal vaccine status: Up to date  Covid-19 vaccine status: Completed vaccines  Qualifies for Shingles Vaccine? Yes   Zostavax completed No   Shingrix Completed?: No.    Education has been provided regarding the importance of this vaccine. Patient has been advised to call insurance company to determine out of pocket expense if they have not yet received this vaccine. Advised may also receive vaccine at local pharmacy or Health Dept. Verbalized acceptance and understanding.  Screening Tests Health Maintenance  Topic Date Due   Hepatitis C Screening  Never done   Zoster Vaccines- Shingrix (1 of 2) Never done   Medicare Annual Wellness (AWV)  12/06/2023   DTaP/Tdap/Td (3 - Td or Tdap) 02/24/2031   Pneumonia Vaccine 27+ Years old  Completed   INFLUENZA VACCINE  Completed   DEXA SCAN  Completed   HPV VACCINES  Aged Out   COLONOSCOPY (Pts 45-77yr Insurance coverage will need to be confirmed)  Discontinued   COVID-19 Vaccine  Discontinued    Health Maintenance  Health Maintenance Due  Topic Date Due   Hepatitis C Screening  Never done   Zoster Vaccines- Shingrix (1 of 2) Never done    Colorectal cancer screening: No longer required.   Mammogram status: Completed 12/05/2022. Repeat every year  Bone Density status: Completed 06/30/2020. Results reflect: Bone density results: NORMAL.  Repeat every 5 years.  Lung Cancer Screening: (Low Dose CT Chest recommended if Age 77-80years, 30 pack-year currently smoking OR have quit w/in 15years.) does not qualify.   Lung Cancer Screening Referral: no  Additional Screening:  Hepatitis C Screening: does qualify; Completed no  Vision Screening: Recommended annual ophthalmology exams for early detection of glaucoma and other disorders of the eye. Is the patient up to date with their annual eye exam?  Yes  Who is the provider or what is the name of the office in which the patient attends annual [facility] exams[facility] If pt is not established with a provider, would they like to be referred to a provider to establish care? No .   Dental Screening: Recommended annual dental exams for proper oral hygiene  Community Resource Referral / Chronic Care Management: CRR required this visit?  No   CCM required this visit?  No      Plan:     I have personally reviewed and noted the following in the patient's chart:   Medical and social history Use of alcohol,  tobacco or illicit drugs  Current medications and supplements including opioid prescriptions. Patient is not currently taking opioid prescriptions. Functional ability and status Nutritional status Physical activity Advanced directives List of other physicians Hospitalizations, surgeries, and ER visits in previous 12 months Vitals Screenings to include cognitive, depression, and falls Referrals and appointments  In addition, I have reviewed and discussed with patient certain preventive protocols, quality metrics, and best practice recommendations. A written personalized care plan for preventive services as well as general preventive health recommendations were provided to patient.     Sheral Flow, LPN   [id number]   Nurse Notes: Normal cognitive status assessed by direct observation by this Nurse Health Advisor. No abnormalities found.      Medical screening  examination/treatment/procedure(s) were performed by non-physician practitioner and as supervising physician I was immediately available for consultation/collaboration.  I agree with above. Lew Dawes, MD

## 2022-12-06 NOTE — Assessment & Plan Note (Addendum)
Husband fell and broke his femur in 08/2022 GD with leukemia since 2017 at 77 yo, now is 42

## 2022-12-06 NOTE — Assessment & Plan Note (Signed)
On Vit D 

## 2022-12-06 NOTE — Assessment & Plan Note (Signed)
Continue w/Xanax prn rare use  Potential benefits of a long term opioids use as well as potential risks (i.e. addiction risk, apnea etc) and complications (i.e. Somnolence, constipation and others) were explained to the patient and were aknowledged. 

## 2022-12-06 NOTE — Assessment & Plan Note (Signed)
S/p colonoscopy 2022 Flagyl x 10 d, then Align x 2-4 weeks

## 2022-12-09 DIAGNOSIS — R921 Mammographic calcification found on diagnostic imaging of breast: Secondary | ICD-10-CM | POA: Diagnosis not present

## 2022-12-09 LAB — HM MAMMOGRAPHY

## 2022-12-12 ENCOUNTER — Encounter: Payer: Self-pay | Admitting: Internal Medicine

## 2023-02-22 ENCOUNTER — Other Ambulatory Visit: Payer: Self-pay | Admitting: Internal Medicine

## 2023-03-09 ENCOUNTER — Other Ambulatory Visit: Payer: Self-pay | Admitting: Internal Medicine

## 2023-03-29 ENCOUNTER — Other Ambulatory Visit: Payer: Self-pay | Admitting: Internal Medicine

## 2023-04-10 ENCOUNTER — Ambulatory Visit: Payer: PPO | Admitting: Internal Medicine

## 2023-04-10 ENCOUNTER — Encounter: Payer: Self-pay | Admitting: Internal Medicine

## 2023-04-10 VITALS — BP 110/60 | HR 93 | Temp 98.3°F | Ht 60.0 in | Wt 164.0 lb

## 2023-04-10 DIAGNOSIS — F439 Reaction to severe stress, unspecified: Secondary | ICD-10-CM

## 2023-04-10 DIAGNOSIS — D649 Anemia, unspecified: Secondary | ICD-10-CM

## 2023-04-10 DIAGNOSIS — M1990 Unspecified osteoarthritis, unspecified site: Secondary | ICD-10-CM

## 2023-04-10 DIAGNOSIS — R600 Localized edema: Secondary | ICD-10-CM

## 2023-04-10 DIAGNOSIS — I10 Essential (primary) hypertension: Secondary | ICD-10-CM

## 2023-04-10 DIAGNOSIS — E871 Hypo-osmolality and hyponatremia: Secondary | ICD-10-CM

## 2023-04-10 DIAGNOSIS — R0609 Other forms of dyspnea: Secondary | ICD-10-CM

## 2023-04-10 DIAGNOSIS — R739 Hyperglycemia, unspecified: Secondary | ICD-10-CM | POA: Diagnosis not present

## 2023-04-10 DIAGNOSIS — R7989 Other specified abnormal findings of blood chemistry: Secondary | ICD-10-CM

## 2023-04-10 LAB — COMPREHENSIVE METABOLIC PANEL
ALT: 60 U/L — ABNORMAL HIGH (ref 0–35)
AST: 55 U/L — ABNORMAL HIGH (ref 0–37)
Albumin: 4.6 g/dL (ref 3.5–5.2)
Alkaline Phosphatase: 68 U/L (ref 39–117)
BUN: 15 mg/dL (ref 6–23)
CO2: 29 mEq/L (ref 19–32)
Calcium: 10.3 mg/dL (ref 8.4–10.5)
Chloride: 99 mEq/L (ref 96–112)
Creatinine, Ser: 0.8 mg/dL (ref 0.40–1.20)
GFR: 71.45 mL/min (ref >60.00)
Glucose, Bld: 124 mg/dL — ABNORMAL HIGH (ref 70–99)
Potassium: 4.4 mEq/L (ref 3.5–5.1)
Sodium: 136 mEq/L (ref 135–145)
Total Bilirubin: 0.8 mg/dL (ref 0.2–1.2)
Total Protein: 7.8 g/dL (ref 6.0–8.3)

## 2023-04-10 LAB — CBC WITH DIFFERENTIAL/PLATELET
Basophils Absolute: 0.1 10*3/uL (ref 0.0–0.1)
Basophils Relative: 0.8 % (ref 0.0–3.0)
Eosinophils Absolute: 0.2 10*3/uL (ref 0.0–0.7)
Eosinophils Relative: 2.4 % (ref 0.0–5.0)
HCT: 39.2 % (ref 36.0–46.0)
Hemoglobin: 13.2 g/dL (ref 12.0–15.0)
Lymphocytes Relative: 23.7 % (ref 12.0–46.0)
Lymphs Abs: 1.6 10*3/uL (ref 0.7–4.0)
MCHC: 33.6 g/dL (ref 30.0–36.0)
MCV: 91.9 fl (ref 78.0–100.0)
Monocytes Absolute: 0.6 10*3/uL (ref 0.1–1.0)
Monocytes Relative: 8.9 % (ref 3.0–12.0)
Neutro Abs: 4.4 10*3/uL (ref 1.4–7.7)
Neutrophils Relative %: 64.2 % (ref 43.0–77.0)
Platelets: 265 10*3/uL (ref 150.0–400.0)
RBC: 4.27 Mil/uL (ref 3.87–5.11)
RDW: 13.2 % (ref 11.5–15.5)
WBC: 6.8 10*3/uL (ref 4.0–10.5)

## 2023-04-10 LAB — HEMOGLOBIN A1C: Hgb A1c MFr Bld: 5.8 % (ref 4.6–6.5)

## 2023-04-10 MED ORDER — AMLODIPINE BESYLATE 2.5 MG PO TABS: 2.5000 mg | ORAL_TABLET | Freq: Every day | ORAL | 11 refills | Status: DC

## 2023-04-10 NOTE — Assessment & Plan Note (Signed)
Low dose Lasix prn Cont w/Irbesartan , Amlodipine 5 mg

## 2023-04-10 NOTE — Progress Notes (Signed)
Subjective:  Patient ID: Alexandra Scott, female    DOB: 03-Feb-1946  Age: 77 y.o. MRN: [id number]  CC: Follow-up (4 mnth f/u, feels flutter in chest when laying on rt side)   HPI Alexandra Scott presents for HTN, dyslipidemia Watering plants w/jugs of water every day [facility] Screening was OK overall  Outpatient Medications Prior to Visit  Medication Sig Dispense Refill   aspirin 81 MG EC tablet Take 81 mg by mouth at bedtime.     BIOTIN PO Take 1 tablet by mouth at bedtime.     Calcium Carbonate-Vitamin D3 600-400 MG-UNIT TABS Take 1 tablet by mouth 2 (two) times daily.      Cholecalciferol (VITAMIN D3) 50 MCG (2000 UT) capsule Take 1 capsule (2,000 Units total) by mouth daily. 100 capsule 3   ciclopirox (PENLAC) 8 % solution APPLY ONE APPLICATION OF SOLUTION TOPICALLY AT BEDTIME. APPLY OVER NAIL AND SURROUNDING SKIN DAILY 7 mL 0   furosemide (LASIX) 20 MG tablet Take 1-2 tablets (20-40 mg total) by mouth daily as needed. 30 tablet 3   irbesartan (AVAPRO) 300 MG tablet Take 1 tablet by mouth once daily 90 tablet 1   Magnesium 250 MG TABS Take 250 mg by mouth daily.      metroNIDAZOLE (FLAGYL) 500 MG tablet Take 1 tablet (500 mg total) by mouth 3 (three) times daily. 30 tablet 0   Multiple Vitamins-Minerals (TH COMPLETE MULTI 50+ PO) Take by mouth.     Omega-3 Fatty Acids (FISH OIL) 1000 MG CAPS Take 1,000 mg by mouth 2 (two) times daily. Reported on 01/11/2016     Polyethyl Glycol-Propyl Glycol (SYSTANE OP) Place 1 drop into both eyes 2 (two) times daily as needed (dry eyes).     Probiotic Product (ALIGN) 4 MG CAPS Take 1 capsule (4 mg total) by mouth daily. 30 capsule 1   Turmeric 500 MG TABS Take 500 mg by mouth 2 (two) times daily.      vitamin C (ASCORBIC ACID) 500 MG tablet Take 500 mg by mouth at bedtime.      amLODipine (NORVASC) 5 MG tablet Take 1 tablet (5 mg total) by mouth daily. Follow-up appt is due must see provider for future refills 30 tablet 0   No  facility-administered medications prior to visit.    ROS: Review of Systems  Constitutional:  Negative for activity change, appetite change, chills, fatigue and unexpected weight change.  HENT:  Negative for congestion, mouth sores and sinus pressure.   Eyes:  Negative for visual disturbance.  Respiratory:  Negative for cough and chest tightness.   Gastrointestinal:  Negative for abdominal pain and nausea.  Genitourinary:  Negative for difficulty urinating, frequency and vaginal pain.  Musculoskeletal:  Positive for arthralgias, back pain and gait problem.  Skin:  Negative for pallor and rash.  Neurological:  Negative for dizziness, tremors, weakness, numbness and headaches.  Psychiatric/Behavioral:  Negative for confusion and sleep disturbance.     Objective:  BP 110/60 (BP Location: Left Arm, Patient Position: Sitting, Cuff Size: Large)   Pulse 93   Temp 98.3 F (36.8 C) (Oral)   Ht 5' (1.524 m)   Wt 164 lb (74.4 kg)   SpO2 98%   BMI 32.03 kg/m   BP Readings from Last 3 Encounters:  04/10/23 110/60  12/06/22 130/70  12/06/22 130/70    Wt Readings from Last 3 Encounters:  04/10/23 164 lb (74.4 kg)  12/06/22 166 lb (75.3 kg)  12/06/22 166 lb (75.3  kg)    Physical Exam Constitutional:      General: She is not in acute distress.    Appearance: She is well-developed. She is obese.  HENT:     Head: Normocephalic.     Right Ear: External ear normal.     Left Ear: External ear normal.     Nose: Nose normal.  Eyes:     General:        Right eye: No discharge.        Left eye: No discharge.     Conjunctiva/sclera: Conjunctivae normal.     Pupils: Pupils are equal, round, and reactive to light.  Neck:     Thyroid: No thyromegaly.     Vascular: No JVD.     Trachea: No tracheal deviation.  Cardiovascular:     Rate and Rhythm: Normal rate and regular rhythm.     Heart sounds: Normal heart sounds.  Pulmonary:     Effort: No respiratory distress.     Breath sounds:  No stridor. No wheezing.  Abdominal:     General: Bowel sounds are normal. There is no distension.     Palpations: Abdomen is soft. There is no mass.     Tenderness: There is no abdominal tenderness. There is no guarding or rebound.  Musculoskeletal:        General: Tenderness present.     Cervical back: Normal range of motion and neck supple. No rigidity.  Lymphadenopathy:     Cervical: No cervical adenopathy.  Skin:    Findings: No erythema or rash.  Neurological:     Mental Status: She is oriented to person, place, and time.     Cranial Nerves: No cranial nerve deficit.     Motor: No abnormal muscle tone.     Coordination: Coordination normal.     Deep Tendon Reflexes: Reflexes normal.  Psychiatric:        Behavior: Behavior normal.        Thought Content: Thought content normal.        Judgment: Judgment normal.   Trace edema B  Lab Results  Component Value Date   WBC 7.8 08/27/2021   HGB 12.8 08/27/2021   HCT 37.8 08/27/2021   PLT 248.0 08/27/2021   GLUCOSE 108 (H) 12/06/2022   CHOL 147 02/07/2018   TRIG 70.0 02/07/2018   HDL 44.30 02/07/2018   LDLDIRECT 150.6 10/15/2007   LDLCALC 89 02/07/2018   ALT 46 (H) 12/06/2022   AST 48 (H) 12/06/2022   NA 138 12/06/2022   K 4.2 12/06/2022   CL 99 12/06/2022   CREATININE 0.71 12/06/2022   BUN 15 12/06/2022   CO2 30 12/06/2022   TSH 2.73 02/28/2022   INR 1.0 02/23/2021   HGBA1C 5.9 09/06/2022    US Abdomen Limited RUQ (LIVER/GB)  Result Date: 09/14/2021 CLINICAL DATA:  Elevated liver function tests.  Thanks EXAM: ULTRASOUND ABDOMEN LIMITED RIGHT UPPER QUADRANT COMPARISON:  CT 01/23/2017 FINDINGS: Gallbladder: No gallstones or wall thickening visualized. No sonographic Murphy sign noted by sonographer. Common bile duct: Diameter: 2.8 mm Liver: Diffusely increased liver echogenicity. Portal vein is patent on color Doppler imaging with normal direction of blood flow towards the liver. Other: None. IMPRESSION: Hepatic  steatosis. Unremarkable gallbladder and biliary ducts. Electronically Signed   By: Caprice Renshaw M.D.   On: 09/14/2021 15:35    Assessment & Plan:   Problem List Items Addressed This Visit     Essential hypertension - Primary    Low  dose Lasix prn Cont w/Irbesartan , Amlodipine 5 mg      Relevant Medications   amLODipine (NORVASC) 2.5 MG tablet   Osteoarthritis    Knees, hips, LS spine      Anemia    Monitor CBC      Relevant Orders   CBC with Differential/Platelet   Stress at home    Better      Hyponatremia    Monitor CMET      Edema    Reduce Norvasc to 2.5 mg/d      DOE (dyspnea on exertion)    Likely deconditioning related Cont w/wt loss effort      Elevated LFTs    Check LFTs      Hyperglycemia    Pt refused statins On fish oil      Relevant Orders   Comprehensive metabolic panel   Hemoglobin A1c      Meds ordered this encounter  Medications   amLODipine (NORVASC) 2.5 MG tablet    Sig: Take 1 tablet (2.5 mg total) by mouth daily.    Dispense:  30 tablet    Refill:  11      Follow-up: Return in about 3 months (around 07/11/2023) for a follow-up visit.  Sonda Primes, MD

## 2023-04-10 NOTE — Assessment & Plan Note (Signed)
Reduce Norvasc to 2.5 mg/d

## 2023-04-10 NOTE — Assessment & Plan Note (Signed)
Knees, hips, LS spine

## 2023-04-10 NOTE — Assessment & Plan Note (Signed)
Monitor CMET

## 2023-04-10 NOTE — Assessment & Plan Note (Signed)
 Better  

## 2023-04-10 NOTE — Assessment & Plan Note (Signed)
Pt refused statins On fish oil

## 2023-04-10 NOTE — Assessment & Plan Note (Signed)
Likely deconditioning related Cont w/wt loss effort

## 2023-04-10 NOTE — Assessment & Plan Note (Signed)
 Check LFTs 

## 2023-04-10 NOTE — Assessment & Plan Note (Signed)
Monitor CBC 

## 2023-06-21 DIAGNOSIS — Z01419 Encounter for gynecological examination (general) (routine) without abnormal findings: Secondary | ICD-10-CM | POA: Diagnosis not present

## 2023-06-28 DIAGNOSIS — H02831 Dermatochalasis of right upper eyelid: Secondary | ICD-10-CM | POA: Diagnosis not present

## 2023-06-28 DIAGNOSIS — H0102B Squamous blepharitis left eye, upper and lower eyelids: Secondary | ICD-10-CM | POA: Diagnosis not present

## 2023-06-28 DIAGNOSIS — H11153 Pinguecula, bilateral: Secondary | ICD-10-CM | POA: Diagnosis not present

## 2023-06-28 DIAGNOSIS — H25813 Combined forms of age-related cataract, bilateral: Secondary | ICD-10-CM | POA: Diagnosis not present

## 2023-06-28 DIAGNOSIS — H04123 Dry eye syndrome of bilateral lacrimal glands: Secondary | ICD-10-CM | POA: Diagnosis not present

## 2023-06-28 DIAGNOSIS — H5703 Miosis: Secondary | ICD-10-CM | POA: Diagnosis not present

## 2023-06-28 DIAGNOSIS — H0102A Squamous blepharitis right eye, upper and lower eyelids: Secondary | ICD-10-CM | POA: Diagnosis not present

## 2023-06-28 DIAGNOSIS — H02055 Trichiasis without entropian left lower eyelid: Secondary | ICD-10-CM | POA: Diagnosis not present

## 2023-06-28 DIAGNOSIS — H02834 Dermatochalasis of left upper eyelid: Secondary | ICD-10-CM | POA: Diagnosis not present

## 2023-07-12 ENCOUNTER — Encounter: Payer: Self-pay | Admitting: Internal Medicine

## 2023-07-12 ENCOUNTER — Ambulatory Visit: Payer: PPO | Admitting: Internal Medicine

## 2023-07-12 VITALS — BP 104/68 | HR 81 | Temp 98.6°F | Ht 60.0 in | Wt 163.0 lb

## 2023-07-12 DIAGNOSIS — R29898 Other symptoms and signs involving the musculoskeletal system: Secondary | ICD-10-CM | POA: Diagnosis not present

## 2023-07-12 DIAGNOSIS — R739 Hyperglycemia, unspecified: Secondary | ICD-10-CM

## 2023-07-12 DIAGNOSIS — M48061 Spinal stenosis, lumbar region without neurogenic claudication: Secondary | ICD-10-CM | POA: Diagnosis not present

## 2023-07-12 DIAGNOSIS — K760 Fatty (change of) liver, not elsewhere classified: Secondary | ICD-10-CM | POA: Diagnosis not present

## 2023-07-12 DIAGNOSIS — M5440 Lumbago with sciatica, unspecified side: Secondary | ICD-10-CM

## 2023-07-12 DIAGNOSIS — Z23 Encounter for immunization: Secondary | ICD-10-CM | POA: Diagnosis not present

## 2023-07-12 DIAGNOSIS — R5382 Chronic fatigue, unspecified: Secondary | ICD-10-CM | POA: Diagnosis not present

## 2023-07-12 LAB — COMPREHENSIVE METABOLIC PANEL
ALT: 67 U/L — ABNORMAL HIGH (ref 0–35)
AST: 67 U/L — ABNORMAL HIGH (ref 0–37)
Albumin: 4.5 g/dL (ref 3.5–5.2)
Alkaline Phosphatase: 71 U/L (ref 39–117)
BUN: 14 mg/dL (ref 6–23)
CO2: 30 meq/L (ref 19–32)
Calcium: 10 mg/dL (ref 8.4–10.5)
Chloride: 98 meq/L (ref 96–112)
Creatinine, Ser: 0.78 mg/dL (ref 0.40–1.20)
GFR: 73.53 mL/min (ref >60.00)
Glucose, Bld: 104 mg/dL — ABNORMAL HIGH (ref 70–99)
Potassium: 4.1 meq/L (ref 3.5–5.1)
Sodium: 136 meq/L (ref 135–145)
Total Bilirubin: 1.2 mg/dL (ref 0.2–1.2)
Total Protein: 7.7 g/dL (ref 6.0–8.3)

## 2023-07-12 LAB — CBC WITH DIFFERENTIAL/PLATELET
Basophils Absolute: 0 10*3/uL (ref 0.0–0.1)
Basophils Relative: 0.6 % (ref 0.0–3.0)
Eosinophils Absolute: 0.3 10*3/uL (ref 0.0–0.7)
Eosinophils Relative: 3.4 % (ref 0.0–5.0)
HCT: 41.1 % (ref 36.0–46.0)
Hemoglobin: 13.6 g/dL (ref 12.0–15.0)
Lymphocytes Relative: 27.1 % (ref 12.0–46.0)
Lymphs Abs: 2 10*3/uL (ref 0.7–4.0)
MCHC: 33.1 g/dL (ref 30.0–36.0)
MCV: 93.6 fL (ref 78.0–100.0)
Monocytes Absolute: 0.7 10*3/uL (ref 0.1–1.0)
Monocytes Relative: 9.9 % (ref 3.0–12.0)
Neutro Abs: 4.4 10*3/uL (ref 1.4–7.7)
Neutrophils Relative %: 59 % (ref 43.0–77.0)
Platelets: 252 10*3/uL (ref 150.0–400.0)
RBC: 4.39 Mil/uL (ref 3.87–5.11)
RDW: 13.3 % (ref 11.5–15.5)
WBC: 7.5 10*3/uL (ref 4.0–10.5)

## 2023-07-12 LAB — HEMOGLOBIN A1C: Hgb A1c MFr Bld: 5.7 % (ref 4.6–6.5)

## 2023-07-12 NOTE — Assessment & Plan Note (Signed)
Check CBC

## 2023-07-12 NOTE — Assessment & Plan Note (Signed)
Fatty liver Monitor LFTs

## 2023-07-12 NOTE — Progress Notes (Signed)
Subjective:  Patient ID: Alexandra Scott, female    DOB: 1946/01/15  Age: 77 y.o. MRN: [id number]  CC: Follow-up (3 mnth f/u)   HPI RYLAH FUKUDA presents for HTN, OA, anemia, edema  Outpatient Medications Prior to Visit  Medication Sig Dispense Refill   amLODipine (NORVASC) 2.5 MG tablet Take 1 tablet (2.5 mg total) by mouth daily. 30 tablet 11   aspirin 81 MG EC tablet Take 81 mg by mouth at bedtime.     BIOTIN PO Take 1 tablet by mouth at bedtime.     Calcium Carbonate-Vitamin D3 600-400 MG-UNIT TABS Take 1 tablet by mouth 2 (two) times daily.      Cholecalciferol (VITAMIN D3) 50 MCG (2000 UT) capsule Take 1 capsule (2,000 Units total) by mouth daily. 100 capsule 3   ciclopirox (PENLAC) 8 % solution APPLY ONE APPLICATION OF SOLUTION TOPICALLY AT BEDTIME. APPLY OVER NAIL AND SURROUNDING SKIN DAILY 7 mL 0   furosemide (LASIX) 20 MG tablet Take 1-2 tablets (20-40 mg total) by mouth daily as needed. 30 tablet 3   irbesartan (AVAPRO) 300 MG tablet Take 1 tablet by mouth once daily 90 tablet 1   Magnesium 250 MG TABS Take 250 mg by mouth daily.      metroNIDAZOLE (FLAGYL) 500 MG tablet Take 1 tablet (500 mg total) by mouth 3 (three) times daily. 30 tablet 0   Multiple Vitamins-Minerals (TH COMPLETE MULTI 50+ PO) Take by mouth.     Omega-3 Fatty Acids (FISH OIL) 1000 MG CAPS Take 1,000 mg by mouth 2 (two) times daily. Reported on 01/11/2016     Polyethyl Glycol-Propyl Glycol (SYSTANE OP) Place 1 drop into both eyes 2 (two) times daily as needed (dry eyes).     Probiotic Product (ALIGN) 4 MG CAPS Take 1 capsule (4 mg total) by mouth daily. 30 capsule 1   Turmeric 500 MG TABS Take 500 mg by mouth 2 (two) times daily.      vitamin C (ASCORBIC ACID) 500 MG tablet Take 500 mg by mouth at bedtime.      No facility-administered medications prior to visit.    ROS: Review of Systems  Constitutional:  Negative for activity change, appetite change, chills, fatigue and unexpected weight  change.  HENT:  Negative for congestion, mouth sores and sinus pressure.   Eyes:  Negative for visual disturbance.  Respiratory:  Negative for cough and chest tightness.   Cardiovascular:  Negative for leg swelling.  Gastrointestinal:  Negative for abdominal pain and nausea.  Genitourinary:  Negative for difficulty urinating, frequency and vaginal pain.  Musculoskeletal:  Positive for arthralgias and back pain. Negative for gait problem.  Skin:  Negative for color change, pallor and rash.  Neurological:  Negative for dizziness, tremors, weakness, numbness and headaches.  Psychiatric/Behavioral:  Negative for confusion and sleep disturbance.     Objective:  BP 104/68 (BP Location: Left Arm, Patient Position: Sitting, Cuff Size: Normal)   Pulse 81   Temp 98.6 F (37 C) (Oral)   Ht 5' (1.524 m)   Wt 163 lb (73.9 kg)   SpO2 94%   BMI 31.83 kg/m   BP Readings from Last 3 Encounters:  07/12/23 104/68  04/10/23 110/60  12/06/22 130/70    Wt Readings from Last 3 Encounters:  07/12/23 163 lb (73.9 kg)  04/10/23 164 lb (74.4 kg)  12/06/22 166 lb (75.3 kg)    Physical Exam Constitutional:      General: She is not in acute  distress.    Appearance: She is well-developed.  HENT:     Head: Normocephalic.     Right Ear: External ear normal.     Left Ear: External ear normal.     Nose: Nose normal.  Eyes:     General:        Right eye: No discharge.        Left eye: No discharge.     Conjunctiva/sclera: Conjunctivae normal.     Pupils: Pupils are equal, round, and reactive to light.  Neck:     Thyroid: No thyromegaly.     Vascular: No JVD.     Trachea: No tracheal deviation.  Cardiovascular:     Rate and Rhythm: Normal rate and regular rhythm.     Heart sounds: Normal heart sounds.  Pulmonary:     Effort: No respiratory distress.     Breath sounds: No stridor. No wheezing.  Abdominal:     General: Bowel sounds are normal. There is no distension.     Palpations: Abdomen  is soft. There is no mass.     Tenderness: There is no abdominal tenderness. There is no guarding or rebound.  Musculoskeletal:        General: No tenderness.     Cervical back: Normal range of motion and neck supple. No rigidity.  Lymphadenopathy:     Cervical: No cervical adenopathy.  Skin:    Findings: No erythema or rash.  Neurological:     Cranial Nerves: No cranial nerve deficit.     Motor: No abnormal muscle tone.     Coordination: Coordination normal.     Deep Tendon Reflexes: Reflexes normal.  Psychiatric:        Behavior: Behavior normal.        Thought Content: Thought content normal.        Judgment: Judgment normal.   LS w/pain  Lab Results  Component Value Date   WBC 6.8 04/10/2023   HGB 13.2 04/10/2023   HCT 39.2 04/10/2023   PLT 265.0 04/10/2023   GLUCOSE 124 (H) 04/10/2023   CHOL 147 02/07/2018   TRIG 70.0 02/07/2018   HDL 44.30 02/07/2018   LDLDIRECT 150.6 10/15/2007   LDLCALC 89 02/07/2018   ALT 60 (H) 04/10/2023   AST 55 (H) 04/10/2023   NA 136 04/10/2023   K 4.4 04/10/2023   CL 99 04/10/2023   CREATININE 0.80 04/10/2023   BUN 15 04/10/2023   CO2 29 04/10/2023   TSH 2.73 02/28/2022   INR 1.0 02/23/2021   HGBA1C 5.8 04/10/2023    US Abdomen Limited RUQ (LIVER/GB)  Result Date: 09/14/2021 CLINICAL DATA:  Elevated liver function tests.  Thanks EXAM: ULTRASOUND ABDOMEN LIMITED RIGHT UPPER QUADRANT COMPARISON:  CT 01/23/2017 FINDINGS: Gallbladder: No gallstones or wall thickening visualized. No sonographic Murphy sign noted by sonographer. Common bile duct: Diameter: 2.8 mm Liver: Diffusely increased liver echogenicity. Portal vein is patent on color Doppler imaging with normal direction of blood flow towards the liver. Other: None. IMPRESSION: Hepatic steatosis. Unremarkable gallbladder and biliary ducts. Electronically Signed   By: Caprice Renshaw M.D.   On: 09/14/2021 15:35    Assessment & Plan:   Problem List Items Addressed This Visit     LOW  BACK PAIN    On Vit D      Fatigue    Check CBC      Relevant Orders   CBC with Differential/Platelet   Leg weakness, bilateral    F/u Ortho - spinal  stenosis, OA PT offered Cont w/ walking      Degenerative lumbar spinal stenosis    Fatty liver Monitor LFTs      Hyperglycemia    Check A1c      Relevant Orders   Comprehensive metabolic panel   Hemoglobin A1c   Fatty liver   Relevant Orders   Comprehensive metabolic panel   CBC with Differential/Platelet   Hemoglobin A1c   Need for influenza vaccination - Primary   Relevant Orders   Flu Vaccine Trivalent High Dose (Fluad) (Completed)      No orders of the defined types were placed in this encounter.     Follow-up: Return in about 4 months (around 11/12/2023) for a follow-up visit.  Sonda Primes, MD

## 2023-07-12 NOTE — Assessment & Plan Note (Signed)
On Vit D 

## 2023-07-12 NOTE — Assessment & Plan Note (Signed)
F/u Ortho - spinal stenosis, OA PT offered Cont w/ walking

## 2023-07-12 NOTE — Assessment & Plan Note (Signed)
Check A1c 

## 2023-07-13 NOTE — Addendum Note (Signed)
Addended by: Tresa Garter on: 07/13/2023 07:10 AM   Modules accepted: Orders

## 2023-07-19 DIAGNOSIS — Z1283 Encounter for screening for malignant neoplasm of skin: Secondary | ICD-10-CM | POA: Diagnosis not present

## 2023-07-19 DIAGNOSIS — B351 Tinea unguium: Secondary | ICD-10-CM | POA: Diagnosis not present

## 2023-07-19 DIAGNOSIS — L570 Actinic keratosis: Secondary | ICD-10-CM | POA: Diagnosis not present

## 2023-07-19 DIAGNOSIS — D225 Melanocytic nevi of trunk: Secondary | ICD-10-CM | POA: Diagnosis not present

## 2023-07-19 DIAGNOSIS — X32XXXD Exposure to sunlight, subsequent encounter: Secondary | ICD-10-CM | POA: Diagnosis not present

## 2023-07-19 DIAGNOSIS — C44311 Basal cell carcinoma of skin of nose: Secondary | ICD-10-CM | POA: Diagnosis not present

## 2023-08-17 ENCOUNTER — Other Ambulatory Visit: Payer: Self-pay | Admitting: Internal Medicine

## 2023-08-27 ENCOUNTER — Other Ambulatory Visit: Payer: Self-pay | Admitting: Internal Medicine

## 2023-08-30 DIAGNOSIS — Z85828 Personal history of other malignant neoplasm of skin: Secondary | ICD-10-CM | POA: Diagnosis not present

## 2023-08-30 DIAGNOSIS — Z08 Encounter for follow-up examination after completed treatment for malignant neoplasm: Secondary | ICD-10-CM | POA: Diagnosis not present

## 2023-11-13 ENCOUNTER — Ambulatory Visit: Payer: PPO | Admitting: Internal Medicine

## 2023-11-15 ENCOUNTER — Ambulatory Visit: Payer: PPO | Admitting: Internal Medicine

## 2023-11-27 ENCOUNTER — Ambulatory Visit (INDEPENDENT_AMBULATORY_CARE_PROVIDER_SITE_OTHER): Payer: PPO

## 2023-11-27 VITALS — Ht 60.0 in | Wt 165.0 lb

## 2023-11-27 DIAGNOSIS — Z0000 Encounter for general adult medical examination without abnormal findings: Secondary | ICD-10-CM

## 2023-11-27 NOTE — Patient Instructions (Signed)
 Alexandra Scott , Thank you for taking time to come for your Medicare Wellness Visit. I appreciate your ongoing commitment to your health goals. Please review the following plan we discussed and let me know if I can assist you in the future.   Referrals/Orders/Follow-Ups/Clinician Recommendations: It was nice talking to you today.  You are due for a Hep C screening.  Remember to discuss the Cologuard, due to your family.  Keep up the good work.   This is a list of the screening recommended for you and due dates:  Health Maintenance  Topic Date Due   Hepatitis C Screening  Never done   Medicare Annual Wellness Visit  11/26/2024   DTaP/Tdap/Td vaccine (3 - Td or Tdap) 02/24/2031   Pneumonia Vaccine  Completed   Flu Shot  Completed   DEXA scan (bone density measurement)  Completed   HPV Vaccine  Aged Out   Colon Cancer Screening  Discontinued   COVID-19 Vaccine  Discontinued   Zoster (Shingles) Vaccine  Discontinued    Advanced directives: (Declined) Advance directive discussed with you today. Even though you declined this today, please call our office should you change your mind, and we can give you the proper paperwork for you to fill out.  Next Medicare Annual Wellness Visit scheduled for next year: Yes

## 2023-11-27 NOTE — Progress Notes (Signed)
 Subjective:   Alexandra Scott is a 78 y.o. who presents for a Medicare Wellness preventive visit.  Visit Complete: Virtual I connected with  Alexandra Scott on 11/27/23 by a audio enabled telemedicine application and verified that I am speaking with the correct person using two identifiers.  Patient Location: Home  Provider Location: Home Office  I discussed the limitations of evaluation and management by telemedicine. The patient expressed understanding and agreed to proceed.  Vital Signs: Because this visit was a virtual/telehealth visit, some criteria may be missing or patient reported. Any vitals not documented were not able to be obtained and vitals that have been documented are patient reported.  VideoDeclined- This patient declined Librarian, academic. Therefore the visit was completed with audio only.  AWV Questionnaire: No: Patient Medicare AWV questionnaire was not completed prior to this visit.  Cardiac Risk Factors include: Other (see comment);advanced age (>54men, >44 women);hypertension, Risk factor comments: Fatty liver     Objective:    Today's Vitals   11/27/23 1308  Weight: 165 lb (74.8 kg)  Height: 5' (1.524 m)   Body mass index is 32.22 kg/m.     11/27/2023    1:15 PM 12/06/2022   10:03 AM 08/17/2021    1:04 PM 03/30/2021    9:18 AM 03/12/2021   11:00 AM 04/23/2020    9:20 AM 10/08/2018    9:57 AM  Advanced Directives  Does Patient Have a Medical Advance Directive? No No No No No No No  Does patient want to make changes to medical advance directive?   No - Patient declined      Would patient like information on creating a medical advance directive?  No - Patient declined  No - Patient declined No - Patient declined No - Patient declined Yes (ED - Information included in AVS)    Current Medications (verified) Outpatient Encounter Medications as of 11/27/2023  Medication Sig   amLODipine (NORVASC) 2.5 MG tablet Take 1 tablet  (2.5 mg total) by mouth daily.   aspirin 81 MG EC tablet Take 81 mg by mouth at bedtime.   BIOTIN PO Take 1 tablet by mouth at bedtime.   Calcium Carbonate-Vitamin D3 600-400 MG-UNIT TABS Take 1 tablet by mouth 2 (two) times daily.    Cholecalciferol (VITAMIN D3) 50 MCG (2000 UT) capsule Take 1 capsule (2,000 Units total) by mouth daily.   ciclopirox (PENLAC) 8 % solution APPLY 1 APPLICATION OF SOLUTION TOPICALLY AT BEDTIME. APPLY OVER NAIL AND SURROUNDING SKIN DAILY.   furosemide (LASIX) 20 MG tablet Take 1-2 tablets (20-40 mg total) by mouth daily as needed.   irbesartan (AVAPRO) 300 MG tablet Take 1 tablet by mouth once daily   Magnesium 250 MG TABS Take 250 mg by mouth daily.    metroNIDAZOLE (FLAGYL) 500 MG tablet Take 1 tablet (500 mg total) by mouth 3 (three) times daily.   Multiple Vitamins-Minerals (TH COMPLETE MULTI 50+ PO) Take by mouth.   Omega-3 Fatty Acids (FISH OIL) 1000 MG CAPS Take 1,000 mg by mouth 2 (two) times daily. Reported on 01/11/2016   Polyethyl Glycol-Propyl Glycol (SYSTANE OP) Place 1 drop into both eyes 2 (two) times daily as needed (dry eyes).   Probiotic Product (ALIGN) 4 MG CAPS Take 1 capsule (4 mg total) by mouth daily.   Turmeric 500 MG TABS Take 500 mg by mouth 2 (two) times daily.    vitamin C (ASCORBIC ACID) 500 MG tablet Take 500 mg by mouth  at bedtime.    No facility-administered encounter medications on file as of 11/27/2023.    Allergies (verified) Augmentin [amoxicillin-pot clavulanate], Cortisone, Hydrocortisone, Maxzide [hydrochlorothiazide w-triamterene], and Prednisone   History: Past Medical History:  Diagnosis Date   Breast cyst 09/26/2006   Left / SER   Colon polyps    Constipation    GERD (gastroesophageal reflux disease)    uses OTC   LBP (low back pain)    ? Spinal stenosis   Osteoarthritis    Osteoarthritis of hip 09/26/2009   Right/Dr Fields   Past Surgical History:  Procedure Laterality Date   BREAST DUCTAL SYSTEM EXCISION  Left 05/22/2014   Procedure: LEFT NIPPLE DUCT EXCISION;  Surgeon: Wilmon Arms. Corliss Skains, MD;  Location: [facility] OR;  Service: General;  Laterality: Left;   BREAST DUCTAL SYSTEM EXCISION Right 03/30/2021   Procedure: RIGHT NIPPLE DUCT EXCISION;  Surgeon: Manus Rudd, MD;  Location: [facility];  Service: General;  Laterality: Right;   COLONOSCOPY     COLONOSCOPY W/ BIOPSIES AND POLYPECTOMY     JOINT REPLACEMENT  7/12   R THR   POLYPECTOMY     Family History  Problem Relation Age of Onset   Colon cancer Maternal Uncle    Hypertension Other    Breast cancer Other    Esophageal cancer Neg Hx    Rectal cancer Neg Hx    Stomach cancer Neg Hx    Social History   Socioeconomic History   Marital status: Married    Spouse name: Mayford Knife   Number of children: Not on file   Years of education: Not on file   Highest education level: Not on file  Occupational History   Occupation: Retired/Runs Garment/textile technologist: self employed  Tobacco Use   Smoking status: Never   Smokeless tobacco: Never  Vaping Use   Vaping status: Never Used  Substance and Sexual Activity   Alcohol use: No    Alcohol/week: 0.0 standard drinks of alcohol   Drug use: No   Sexual activity: Not Currently    Birth control/protection: Post-menopausal  Other Topics Concern   Not on file  Social History Narrative   Lives at home with husband/2025   Social Drivers of Health   Financial Resource Strain: Low Risk  (11/27/2023)   Overall Financial Resource Strain (CARDIA)    Difficulty of Paying Living Expenses: Not very hard  Food Insecurity: No Food Insecurity (11/27/2023)   Hunger Vital Sign    Worried About Running Out of Food in the Last Year: Never true    Ran Out of Food in the Last Year: Never true  Transportation Needs: No Transportation Needs (11/27/2023)   PRAPARE - Administrator, Civil Service (Medical): No    Lack of Transportation (Non-Medical): No  Physical Activity: Sufficiently Active  (11/27/2023)   Exercise Vital Sign    Days of Exercise per Week: 7 days    Minutes of Exercise per Session: 40 min  Stress: No Stress Concern Present (11/27/2023)   [employer] of Occupational Health - Occupational Stress Questionnaire    Feeling of Stress : Not at all  Social Connections: Socially Integrated (11/27/2023)   Social Connection and Isolation Panel [NHANES]    Frequency of Communication with Friends and Family: More than three times a week    Frequency of Social Gatherings with Friends and Family: More than three times a week    Attends Religious Services: More than 4 times per year  Active Member of Clubs or Organizations: Yes    Attends Banker Meetings: 1 to 4 times per year    Marital Status: Married    Tobacco Counseling Counseling given: Not Answered    Clinical Intake:  Pre-visit preparation completed: Yes  Pain : No/denies pain     BMI - recorded: 32.22 Nutritional Status: BMI > 30  Obese Nutritional Risks: None Diabetes: No  How often do you need to have someone help you when you read instructions, pamphlets, or other written materials from your doctor or pharmacy?: 1 - Never  Interpreter Needed?: No  Information entered by :: Lennis Rader, RMA   Activities of Daily Living     11/27/2023    1:08 PM 12/06/2022   10:04 AM  In your present state of health, do you have any difficulty performing the following activities:  Hearing? 0 0  Vision? 0 0  Difficulty concentrating or making decisions? 0 0  Walking or climbing stairs? 0 0  Dressing or bathing? 0 0  Doing errands, shopping? 0 0  Preparing Food and eating ? N N  Using the Toilet? N N  In the past six months, have you accidently leaked urine? N N  Do you have problems with loss of bowel control? N N  Managing your Medications? N N  Managing your Finances? N N  Housekeeping or managing your Housekeeping? N N    Patient Care Team: Plotnikov, Georgina Quint, MD as PCP -  General Luvenia Redden, MD (Obstetrics and Gynecology) Meryl Dare, MD (Inactive) (Gastroenterology) Ollen Gross, MD (Orthopedic Surgery) Manus Rudd, MD as Consulting Physician (General Surgery) [facility]  Indicate any recent Medical Services you may have received from other than [facility] providers in the past year (date may be approximate).     Assessment:   This is a routine wellness examination for Deardra.  Hearing/Vision screen Hearing Screening - Comments:: Denies hearing difficulties   Vision Screening - Comments:: Wears eyeglasses   Goals Addressed             This Visit's Progress    My healthcare goal for 2024 is to maintain my current health status by continuing to eat healthy, stay independent, physically and socially active.   On track      Depression Screen     11/27/2023    1:28 PM 07/12/2023    9:42 AM 04/10/2023    9:00 AM 12/06/2022   10:02 AM 12/06/2022    9:01 AM 09/06/2022    9:38 AM 06/07/2022    9:41 AM  PHQ 2/9 Scores  PHQ - 2 Score 0 0 0 0 0 0 0  PHQ- 9 Score 0   0  0 2    Fall Risk     11/27/2023    1:15 PM 07/12/2023    9:42 AM 04/10/2023    8:59 AM 12/06/2022    9:01 AM 09/06/2022    9:38 AM  Fall Risk   Falls in the past year? 1 0 0 0 0  Number falls in past yr: 0 0 0 0 0  Injury with Fall? 0 0 0 0 0  Risk for fall due to :  No Fall Risks No Fall Risks No Fall Risks No Fall Risks  Follow up Falls evaluation completed;Falls prevention discussed Falls evaluation completed Falls evaluation completed Falls evaluation completed     MEDICARE RISK AT HOME:  Medicare Risk at Home Any stairs in or around  the home?: Yes (3 in front and 3 in backyard.) If so, are there any without handrails?: Yes Home free of loose throw rugs in walkways, pet beds, electrical cords, etc?: Yes Adequate lighting in your home to reduce risk of falls?: Yes Life alert?: No Use of a cane, walker or w/c?: Yes (cane sometimes) Grab bars  in the bathroom?: No Shower chair or bench in shower?: Yes Elevated toilet seat or a handicapped toilet?: Yes  TIMED UP AND GO:  Was the test performed?  No  Cognitive Function: 6CIT completed        11/27/2023    1:09 PM 12/06/2022   10:05 AM 04/23/2020    9:22 AM  6CIT Screen  What Year? 0 points 0 points 0 points  What month? 0 points 0 points 0 points  What time? 0 points 0 points 0 points  Count back from 20 0 points 0 points 0 points  Months in reverse 0 points 0 points 0 points  Repeat phrase 0 points 0 points 0 points  Total Score 0 points 0 points 0 points    Immunizations Immunization History  Administered Date(s) Administered   Fluad Quad(high Dose 65+) 05/22/2019, 08/14/2020, 07/20/2021   Fluad Trivalent(High Dose 65+) 07/12/2023   Influenza, High Dose Seasonal PF 07/10/2022   Influenza,inj,Quad PF,6+ Mos 06/30/2014, 06/30/2015   Influenza-Unspecified 06/27/2015   Pneumococcal Conjugate-13 01/11/2016   Pneumococcal Polysaccharide-23 02/06/2018   Td 11/19/2010   Tdap 02/23/2021    Screening Tests Health Maintenance  Topic Date Due   Hepatitis C Screening  Never done   Medicare Annual Wellness (AWV)  11/26/2024   DTaP/Tdap/Td (3 - Td or Tdap) 02/24/2031   Pneumonia Vaccine 13+ Years old  Completed   INFLUENZA VACCINE  Completed   DEXA SCAN  Completed   HPV VACCINES  Aged Out   Colonoscopy  Discontinued   COVID-19 Vaccine  Discontinued   Zoster Vaccines- Shingrix  Discontinued    Health Maintenance  Health Maintenance Due  Topic Date Due   Hepatitis C Screening  Never done   Health Maintenance Items Addressed: Hepatitis C Screening, See Nurse Notes  Additional Screening:  Vision Screening: Recommended annual ophthalmology exams for early detection of glaucoma and other disorders of the eye.  Dental Screening: Recommended annual dental exams for proper oral hygiene  Community Resource Referral / Chronic Care Management: CRR required this  visit?  No   CCM required this visit?  No     Plan:     I have personally reviewed and noted the following in the patient's chart:   Medical and social history Use of alcohol, tobacco or illicit drugs  Current medications and supplements including opioid prescriptions. Patient is not currently taking opioid prescriptions. Functional ability and status Nutritional status Physical activity Advanced directives List of other physicians Hospitalizations, surgeries, and ER visits in previous 12 months Vitals Screenings to include cognitive, depression, and falls Referrals and appointments  In addition, I have reviewed and discussed with patient certain preventive protocols, quality metrics, and best practice recommendations. A written personalized care plan for preventive services as well as general preventive health recommendations were provided to patient.     Brantley Naser L Antonius Hartlage, CMA   11/27/2023   After Visit Summary: (MyChart) Due to this being a telephonic visit, the after visit summary with patients personalized plan was offered to patient via MyChart   Notes: Please refer to Routing Comments.

## 2023-11-29 ENCOUNTER — Encounter: Payer: Self-pay | Admitting: Internal Medicine

## 2023-11-29 ENCOUNTER — Ambulatory Visit: Payer: PPO | Admitting: Internal Medicine

## 2023-11-29 VITALS — BP 118/72 | HR 87 | Temp 98.1°F | Ht 60.0 in | Wt 168.0 lb

## 2023-11-29 DIAGNOSIS — R5382 Chronic fatigue, unspecified: Secondary | ICD-10-CM | POA: Diagnosis not present

## 2023-11-29 DIAGNOSIS — R29898 Other symptoms and signs involving the musculoskeletal system: Secondary | ICD-10-CM | POA: Diagnosis not present

## 2023-11-29 DIAGNOSIS — R739 Hyperglycemia, unspecified: Secondary | ICD-10-CM | POA: Diagnosis not present

## 2023-11-29 DIAGNOSIS — E669 Obesity, unspecified: Secondary | ICD-10-CM

## 2023-11-29 DIAGNOSIS — M5440 Lumbago with sciatica, unspecified side: Secondary | ICD-10-CM | POA: Diagnosis not present

## 2023-11-29 DIAGNOSIS — K760 Fatty (change of) liver, not elsewhere classified: Secondary | ICD-10-CM | POA: Diagnosis not present

## 2023-11-29 DIAGNOSIS — M810 Age-related osteoporosis without current pathological fracture: Secondary | ICD-10-CM | POA: Diagnosis not present

## 2023-11-29 DIAGNOSIS — M1711 Unilateral primary osteoarthritis, right knee: Secondary | ICD-10-CM

## 2023-11-29 LAB — COMPREHENSIVE METABOLIC PANEL
ALT: 70 U/L — ABNORMAL HIGH (ref 0–35)
AST: 66 U/L — ABNORMAL HIGH (ref 0–37)
Albumin: 4.3 g/dL (ref 3.5–5.2)
Alkaline Phosphatase: 70 U/L (ref 39–117)
BUN: 15 mg/dL (ref 6–23)
CO2: 31 meq/L (ref 19–32)
Calcium: 9.6 mg/dL (ref 8.4–10.5)
Chloride: 98 meq/L (ref 96–112)
Creatinine, Ser: 0.76 mg/dL (ref 0.40–1.20)
GFR: 75.65 mL/min (ref >60.00)
Glucose, Bld: 112 mg/dL — ABNORMAL HIGH (ref 70–99)
Potassium: 4.3 meq/L (ref 3.5–5.1)
Sodium: 135 meq/L (ref 135–145)
Total Bilirubin: 0.8 mg/dL (ref 0.2–1.2)
Total Protein: 7.4 g/dL (ref 6.0–8.3)

## 2023-11-29 LAB — HEMOGLOBIN A1C: Hgb A1c MFr Bld: 5.8 % (ref 4.6–6.5)

## 2023-11-29 NOTE — Assessment & Plan Note (Signed)
 Due to spinal stenosis, OA Cont w/ walking Wt loss discussed

## 2023-11-29 NOTE — Assessment & Plan Note (Signed)
 On Vit D Wt loss Not interested in Rx, PT etc

## 2023-11-29 NOTE — Patient Instructions (Signed)

## 2023-11-29 NOTE — Assessment & Plan Note (Addendum)
 On Vit D DEXA offered

## 2023-11-29 NOTE — Assessment & Plan Note (Signed)
Loose wt 

## 2023-11-29 NOTE — Assessment & Plan Note (Signed)
 BMI 32 Advised to stop drinking sweet iced tea Phentermine offered, Nutrition consult was offered

## 2023-11-29 NOTE — Progress Notes (Signed)
 Subjective:  Patient ID: Alexandra Scott, female    DOB: 1946/07/16  Age: 78 y.o. MRN: [id number]  CC: Medical Management of Chronic Issues (4 MNTH F/U)   HPI Alexandra Scott presents for HTN, LBP, OA C/o wt gain - sweet tea, bread  Outpatient Medications Prior to Visit  Medication Sig Dispense Refill   amLODipine (NORVASC) 2.5 MG tablet Take 1 tablet (2.5 mg total) by mouth daily. 30 tablet 11   aspirin 81 MG EC tablet Take 81 mg by mouth at bedtime.     BIOTIN PO Take 1 tablet by mouth at bedtime.     Calcium Carbonate-Vitamin D3 600-400 MG-UNIT TABS Take 1 tablet by mouth 2 (two) times daily.      Cholecalciferol (VITAMIN D3) 50 MCG (2000 UT) capsule Take 1 capsule (2,000 Units total) by mouth daily. 100 capsule 3   ciclopirox (PENLAC) 8 % solution APPLY 1 APPLICATION OF SOLUTION TOPICALLY AT BEDTIME. APPLY OVER NAIL AND SURROUNDING SKIN DAILY. 7 mL 0   furosemide (LASIX) 20 MG tablet Take 1-2 tablets (20-40 mg total) by mouth daily as needed. 30 tablet 3   irbesartan (AVAPRO) 300 MG tablet Take 1 tablet by mouth once daily 90 tablet 3   Magnesium 250 MG TABS Take 250 mg by mouth daily.      metroNIDAZOLE (FLAGYL) 500 MG tablet Take 1 tablet (500 mg total) by mouth 3 (three) times daily. 30 tablet 0   Multiple Vitamins-Minerals (TH COMPLETE MULTI 50+ PO) Take by mouth.     Omega-3 Fatty Acids (FISH OIL) 1000 MG CAPS Take 1,000 mg by mouth 2 (two) times daily. Reported on 01/11/2016     Polyethyl Glycol-Propyl Glycol (SYSTANE OP) Place 1 drop into both eyes 2 (two) times daily as needed (dry eyes).     Probiotic Product (ALIGN) 4 MG CAPS Take 1 capsule (4 mg total) by mouth daily. 30 capsule 1   Turmeric 500 MG TABS Take 500 mg by mouth 2 (two) times daily.      vitamin C (ASCORBIC ACID) 500 MG tablet Take 500 mg by mouth at bedtime.      No facility-administered medications prior to visit.    ROS: Review of Systems  Constitutional:  Positive for unexpected weight change.  Negative for activity change, appetite change, chills and fatigue.  HENT:  Negative for congestion, mouth sores and sinus pressure.   Eyes:  Negative for visual disturbance.  Respiratory:  Negative for cough and chest tightness.   Gastrointestinal:  Negative for abdominal pain and nausea.  Genitourinary:  Negative for difficulty urinating, frequency and vaginal pain.  Musculoskeletal:  Positive for back pain and gait problem.  Skin:  Negative for pallor and rash.  Neurological:  Negative for dizziness, tremors, weakness, numbness and headaches.  Psychiatric/Behavioral:  Negative for confusion, sleep disturbance and suicidal ideas. The patient is nervous/anxious.     Objective:  BP 118/72   Pulse 87   Temp 98.1 F (36.7 C) (Oral)   Ht 5' (1.524 m)   Wt 168 lb (76.2 kg)   SpO2 96%   BMI 32.81 kg/m   BP Readings from Last 3 Encounters:  11/29/23 118/72  07/12/23 104/68  04/10/23 110/60    Wt Readings from Last 3 Encounters:  11/29/23 168 lb (76.2 kg)  11/27/23 165 lb (74.8 kg)  07/12/23 163 lb (73.9 kg)    Physical Exam Constitutional:      General: She is not in acute distress.  Appearance: She is well-developed. She is obese.  HENT:     Head: Normocephalic.     Right Ear: External ear normal.     Left Ear: External ear normal.     Nose: Nose normal.  Eyes:     General:        Right eye: No discharge.        Left eye: No discharge.     Conjunctiva/sclera: Conjunctivae normal.     Pupils: Pupils are equal, round, and reactive to light.  Neck:     Thyroid: No thyromegaly.     Vascular: No JVD.     Trachea: No tracheal deviation.  Cardiovascular:     Rate and Rhythm: Normal rate and regular rhythm.     Heart sounds: Normal heart sounds.  Pulmonary:     Effort: No respiratory distress.     Breath sounds: No stridor. No wheezing.  Abdominal:     General: Bowel sounds are normal. There is no distension.     Palpations: Abdomen is soft. There is no mass.      Tenderness: There is no abdominal tenderness. There is no guarding or rebound.  Musculoskeletal:        General: Tenderness present.     Cervical back: Normal range of motion and neck supple. No rigidity.     Right lower leg: No edema.     Left lower leg: No edema.  Lymphadenopathy:     Cervical: No cervical adenopathy.  Skin:    Findings: No erythema or rash.  Neurological:     Mental Status: She is oriented to person, place, and time.     Cranial Nerves: No cranial nerve deficit.     Motor: No abnormal muscle tone.     Coordination: Coordination normal.     Gait: Gait abnormal.     Deep Tendon Reflexes: Reflexes normal.  Psychiatric:        Behavior: Behavior normal.        Thought Content: Thought content normal.        Judgment: Judgment normal.     Lab Results  Component Value Date   WBC 7.5 07/12/2023   HGB 13.6 07/12/2023   HCT 41.1 07/12/2023   PLT 252.0 07/12/2023   GLUCOSE 104 (H) 07/12/2023   CHOL 147 02/07/2018   TRIG 70.0 02/07/2018   HDL 44.30 02/07/2018   LDLDIRECT 150.6 10/15/2007   LDLCALC 89 02/07/2018   ALT 67 (H) 07/12/2023   AST 67 (H) 07/12/2023   NA 136 07/12/2023   K 4.1 07/12/2023   CL 98 07/12/2023   CREATININE 0.78 07/12/2023   BUN 14 07/12/2023   CO2 30 07/12/2023   TSH 2.73 02/28/2022   INR 1.0 02/23/2021   HGBA1C 5.7 07/12/2023    US Abdomen Limited RUQ (LIVER/GB) Result Date: 09/14/2021 CLINICAL DATA:  Elevated liver function tests.  Thanks EXAM: ULTRASOUND ABDOMEN LIMITED RIGHT UPPER QUADRANT COMPARISON:  CT 01/23/2017 FINDINGS: Gallbladder: No gallstones or wall thickening visualized. No sonographic Murphy sign noted by sonographer. Common bile duct: Diameter: 2.8 mm Liver: Diffusely increased liver echogenicity. Portal vein is patent on color Doppler imaging with normal direction of blood flow towards the liver. Other: None. IMPRESSION: Hepatic steatosis. Unremarkable gallbladder and biliary ducts. Electronically Signed   By: Caprice Renshaw M.D.   On: 09/14/2021 15:35    Assessment & Plan:   Problem List Items Addressed This Visit     LOW BACK PAIN   On Vit D  Wt loss Not interested in Rx, PT etc      Osteoporosis, post-menopausal   On Vit D DEXA offered      Leg weakness, bilateral   Due to spinal stenosis, OA Cont w/ walking Wt loss discussed      Obesity (BMI 30-39.9) - Primary   BMI 32 Advised to stop drinking sweet iced tea Phentermine offered, Nutrition consult was offered      Osteoarthritis of right knee   Chronic pain      Hyperglycemia   Loose wt      Fatty liver   Repeat LFTs, loose wt, cut back on sweets.  We can use diabetic medicines like metformin, Actos, Rybelsus to help to improve  fatty liver. On milk thistle capsules Check AFP         No orders of the defined types were placed in this encounter.     Follow-up: Return in about 4 months (around 03/30/2024) for a follow-up visit.  Sonda Primes, MD

## 2023-11-29 NOTE — Assessment & Plan Note (Addendum)
 Repeat LFTs, loose wt, cut back on sweets.  We can use diabetic medicines like metformin, Actos, Rybelsus to help to improve  fatty liver. On milk thistle capsules Check AFP

## 2023-11-29 NOTE — Assessment & Plan Note (Signed)
 Chronic pain

## 2023-12-01 LAB — AFP TUMOR MARKER: AFP-Tumor Marker: 3.1 ng/mL

## 2023-12-03 ENCOUNTER — Encounter: Payer: Self-pay | Admitting: Internal Medicine

## 2023-12-11 DIAGNOSIS — Z1231 Encounter for screening mammogram for malignant neoplasm of breast: Secondary | ICD-10-CM | POA: Diagnosis not present

## 2023-12-11 LAB — HM MAMMOGRAPHY

## 2023-12-28 ENCOUNTER — Other Ambulatory Visit: Payer: Self-pay | Admitting: Internal Medicine

## 2024-03-05 DIAGNOSIS — C44311 Basal cell carcinoma of skin of nose: Secondary | ICD-10-CM | POA: Diagnosis not present

## 2024-04-01 ENCOUNTER — Encounter: Payer: Self-pay | Admitting: Internal Medicine

## 2024-04-01 ENCOUNTER — Ambulatory Visit (INDEPENDENT_AMBULATORY_CARE_PROVIDER_SITE_OTHER): Admitting: Internal Medicine

## 2024-04-01 VITALS — BP 122/70 | HR 78 | Temp 98.3°F | Ht 60.0 in | Wt 159.0 lb

## 2024-04-01 DIAGNOSIS — I10 Essential (primary) hypertension: Secondary | ICD-10-CM

## 2024-04-01 DIAGNOSIS — R21 Rash and other nonspecific skin eruption: Secondary | ICD-10-CM

## 2024-04-01 DIAGNOSIS — M5440 Lumbago with sciatica, unspecified side: Secondary | ICD-10-CM

## 2024-04-01 MED ORDER — AMLODIPINE BESYLATE 2.5 MG PO TABS: 2.5000 mg | ORAL_TABLET | Freq: Every day | ORAL | 3 refills | Status: AC

## 2024-04-01 MED ORDER — IRBESARTAN 300 MG PO TABS: 300.0000 mg | ORAL_TABLET | Freq: Every day | ORAL | 3 refills | Status: AC

## 2024-04-01 MED ORDER — TRIAMCINOLONE ACETONIDE 0.5 % EX CREA: 1.0000 | TOPICAL_CREAM | Freq: Four times a day (QID) | CUTANEOUS | 1 refills | Status: AC

## 2024-04-01 NOTE — Assessment & Plan Note (Signed)
Low dose Lasix prn Cont w/Irbesartan , Amlodipine 5 mg

## 2024-04-01 NOTE — Progress Notes (Signed)
 Subjective:  Patient ID: Alexandra Scott, female    DOB: 31-Dec-1945  Age: 78 y.o. MRN: [id number]  CC: Medical Management of Chronic Issues (4 mnth f/u )   HPI Alexandra Scott presents for HTN C/o insect bites  on the LLE - pt was pulling weeds prior  Outpatient Medications Prior to Visit  Medication Sig Dispense Refill   aspirin  81 MG EC tablet Take 81 mg by mouth at bedtime.     BIOTIN PO Take 1 tablet by mouth at bedtime.     Calcium Carbonate-Vitamin D3 600-400 MG-UNIT TABS Take 1 tablet by mouth 2 (two) times daily.      Cholecalciferol (VITAMIN D3) 50 MCG (2000 UT) capsule Take 1 capsule (2,000 Units total) by mouth daily. 100 capsule 3   ciclopirox  (PENLAC ) 8 % solution APPLY 1 APPLICATION OF SOLUTION TOPICALLY AT BEDTIME. APPLY OVER NAIL AND SURROUNDING SKIN DAILY. 7 mL 0   furosemide  (LASIX ) 20 MG tablet Take 1-2 tablets (20-40 mg total) by mouth daily as needed. 30 tablet 3   Magnesium 250 MG TABS Take 250 mg by mouth daily.      metroNIDAZOLE  (FLAGYL ) 500 MG tablet Take 1 tablet (500 mg total) by mouth 3 (three) times daily. 30 tablet 0   Multiple Vitamins-Minerals (TH COMPLETE MULTI 50+ PO) Take by mouth.     Omega-3 Fatty Acids (FISH OIL) 1000 MG CAPS Take 1,000 mg by mouth 2 (two) times daily. Reported on 01/11/2016     Polyethyl Glycol-Propyl Glycol (SYSTANE OP) Place 1 drop into both eyes 2 (two) times daily as needed (dry eyes).     Probiotic Product (ALIGN) 4 MG CAPS Take 1 capsule (4 mg total) by mouth daily. 30 capsule 1   Turmeric 500 MG TABS Take 500 mg by mouth 2 (two) times daily.      vitamin C (ASCORBIC ACID) 500 MG tablet Take 500 mg by mouth at bedtime.      amLODipine  (NORVASC ) 2.5 MG tablet Take 1 tablet (2.5 mg total) by mouth daily. 30 tablet 11   irbesartan  (AVAPRO ) 300 MG tablet Take 1 tablet by mouth once daily 90 tablet 3   No facility-administered medications prior to visit.    ROS: Review of Systems  Constitutional:  Negative for activity  change, appetite change, chills, fatigue and unexpected weight change.  HENT:  Negative for congestion, mouth sores and sinus pressure.   Eyes:  Negative for visual disturbance.  Respiratory:  Negative for cough and chest tightness.   Gastrointestinal:  Negative for abdominal pain and nausea.  Genitourinary:  Negative for difficulty urinating, frequency and vaginal pain.  Musculoskeletal:  Negative for back pain and gait problem.  Skin:  Positive for rash and wound. Negative for pallor.  Neurological:  Negative for dizziness, tremors, weakness, numbness and headaches.  Hematological:  Does not bruise/bleed easily.  Psychiatric/Behavioral:  Negative for confusion, sleep disturbance and suicidal ideas.     Objective:  BP 122/70   Pulse 78   Temp 98.3 F (36.8 C) (Oral)   Ht 5' (1.524 m)   Wt 159 lb (72.1 kg)   SpO2 97%   BMI 31.05 kg/m   BP Readings from Last 3 Encounters:  04/01/24 122/70  11/29/23 118/72  07/12/23 104/68    Wt Readings from Last 3 Encounters:  04/01/24 159 lb (72.1 kg)  11/29/23 168 lb (76.2 kg)  11/27/23 165 lb (74.8 kg)    Physical Exam Constitutional:      General:  She is not in acute distress.    Appearance: She is well-developed. She is obese.  HENT:     Head: Normocephalic.     Right Ear: External ear normal.     Left Ear: External ear normal.     Nose: Nose normal.  Eyes:     General:        Right eye: No discharge.        Left eye: No discharge.     Conjunctiva/sclera: Conjunctivae normal.     Pupils: Pupils are equal, round, and reactive to light.  Neck:     Thyroid : No thyromegaly.     Vascular: No JVD.     Trachea: No tracheal deviation.  Cardiovascular:     Rate and Rhythm: Normal rate and regular rhythm.     Heart sounds: Normal heart sounds.  Pulmonary:     Effort: No respiratory distress.     Breath sounds: No stridor. No wheezing.  Abdominal:     General: Bowel sounds are normal. There is no distension.     Palpations:  Abdomen is soft. There is no mass.     Tenderness: There is no abdominal tenderness. There is no guarding or rebound.  Musculoskeletal:        General: No tenderness.     Cervical back: Normal range of motion and neck supple. No rigidity.  Lymphadenopathy:     Cervical: No cervical adenopathy.  Skin:    Findings: No erythema or rash.  Neurological:     Cranial Nerves: No cranial nerve deficit.     Motor: No abnormal muscle tone.     Coordination: Coordination normal.     Deep Tendon Reflexes: Reflexes normal.  Psychiatric:        Behavior: Behavior normal.        Thought Content: Thought content normal.        Judgment: Judgment normal.   L foot shallow ulcer 6 mm Rash patches  - upper leg  Lab Results  Component Value Date   WBC 7.5 07/12/2023   HGB 13.6 07/12/2023   HCT 41.1 07/12/2023   PLT 252.0 07/12/2023   GLUCOSE 112 (H) 11/29/2023   CHOL 147 02/07/2018   TRIG 70.0 02/07/2018   HDL 44.30 02/07/2018   LDLDIRECT 150.6 10/15/2007   LDLCALC 89 02/07/2018   ALT 70 (H) 11/29/2023   AST 66 (H) 11/29/2023   NA 135 11/29/2023   K 4.3 11/29/2023   CL 98 11/29/2023   CREATININE 0.76 11/29/2023   BUN 15 11/29/2023   CO2 31 11/29/2023   TSH 2.73 02/28/2022   INR 1.0 02/23/2021   HGBA1C 5.8 11/29/2023    US  Abdomen Limited RUQ (LIVER/GB) Result Date: 09/14/2021 CLINICAL DATA:  Elevated liver function tests.  Thanks EXAM: ULTRASOUND ABDOMEN LIMITED RIGHT UPPER QUADRANT COMPARISON:  CT 01/23/2017 FINDINGS: Gallbladder: No gallstones or wall thickening visualized. No sonographic Murphy sign noted by sonographer. Common bile duct: Diameter: 2.8 mm Liver: Diffusely increased liver echogenicity. Portal vein is patent on color Doppler imaging with normal direction of blood flow towards the liver. Other: None. IMPRESSION: Hepatic steatosis. Unremarkable gallbladder and biliary ducts. Electronically Signed   By: Jacob  Kahn M.D.   On: 09/14/2021 15:35    Assessment & Plan:    Problem List Items Addressed This Visit     Essential hypertension   Low dose Lasix  prn Cont w/Irbesartan  , Amlodipine  5 mg      Relevant Medications   amLODipine  (NORVASC ) 2.5 MG  tablet   irbesartan  (AVAPRO ) 300 MG tablet   LOW BACK PAIN   On Vit D Wt loss Not interested in Rx, PT etc      Rash and nonspecific skin eruption - Primary   Contact dermatitis vs insect bite - LLE Rx Triamc cream. Benadryl po prn Derm appt tomorrow         Meds ordered this encounter  Medications   amLODipine  (NORVASC ) 2.5 MG tablet    Sig: Take 1 tablet (2.5 mg total) by mouth daily.    Dispense:  90 tablet    Refill:  3   irbesartan  (AVAPRO ) 300 MG tablet    Sig: Take 1 tablet (300 mg total) by mouth daily.    Dispense:  90 tablet    Refill:  3   triamcinolone  cream (KENALOG ) 0.5 %    Sig: Apply 1 Application topically 4 (four) times daily.    Dispense:  90 g    Refill:  1      Follow-up: Return in about 3 months (around 07/02/2024) for a follow-up visit.  Marolyn Noel, MD

## 2024-04-01 NOTE — Assessment & Plan Note (Signed)
 Contact dermatitis vs insect bite - LLE Rx Triamc cream. Benadryl po prn Derm appt tomorrow

## 2024-04-01 NOTE — Assessment & Plan Note (Signed)
 On Vit D Wt loss Not interested in Rx, PT etc

## 2024-04-02 DIAGNOSIS — Z85828 Personal history of other malignant neoplasm of skin: Secondary | ICD-10-CM | POA: Diagnosis not present

## 2024-04-02 DIAGNOSIS — D692 Other nonthrombocytopenic purpura: Secondary | ICD-10-CM | POA: Diagnosis not present

## 2024-04-02 DIAGNOSIS — D485 Neoplasm of uncertain behavior of skin: Secondary | ICD-10-CM | POA: Diagnosis not present

## 2024-04-02 DIAGNOSIS — Z08 Encounter for follow-up examination after completed treatment for malignant neoplasm: Secondary | ICD-10-CM | POA: Diagnosis not present

## 2024-06-06 ENCOUNTER — Other Ambulatory Visit: Payer: Self-pay | Admitting: Internal Medicine

## 2024-06-24 DIAGNOSIS — Z124 Encounter for screening for malignant neoplasm of cervix: Secondary | ICD-10-CM | POA: Diagnosis not present

## 2024-06-24 DIAGNOSIS — Z01419 Encounter for gynecological examination (general) (routine) without abnormal findings: Secondary | ICD-10-CM | POA: Diagnosis not present

## 2024-06-28 LAB — HM PAP SMEAR

## 2024-07-02 ENCOUNTER — Ambulatory Visit: Admitting: Internal Medicine

## 2024-07-02 DIAGNOSIS — H2513 Age-related nuclear cataract, bilateral: Secondary | ICD-10-CM | POA: Diagnosis not present

## 2024-07-02 DIAGNOSIS — H04123 Dry eye syndrome of bilateral lacrimal glands: Secondary | ICD-10-CM | POA: Diagnosis not present

## 2024-07-02 DIAGNOSIS — H02055 Trichiasis without entropian left lower eyelid: Secondary | ICD-10-CM | POA: Diagnosis not present

## 2024-07-02 DIAGNOSIS — H5703 Miosis: Secondary | ICD-10-CM | POA: Diagnosis not present

## 2024-07-02 DIAGNOSIS — H11153 Pinguecula, bilateral: Secondary | ICD-10-CM | POA: Diagnosis not present

## 2024-07-03 ENCOUNTER — Encounter: Payer: Self-pay | Admitting: Internal Medicine

## 2024-07-03 ENCOUNTER — Ambulatory Visit: Admitting: Internal Medicine

## 2024-07-03 VITALS — BP 152/88 | HR 78 | Temp 98.1°F | Ht 60.0 in | Wt 161.4 lb

## 2024-07-03 DIAGNOSIS — F419 Anxiety disorder, unspecified: Secondary | ICD-10-CM | POA: Diagnosis not present

## 2024-07-03 DIAGNOSIS — R739 Hyperglycemia, unspecified: Secondary | ICD-10-CM

## 2024-07-03 DIAGNOSIS — R350 Frequency of micturition: Secondary | ICD-10-CM | POA: Diagnosis not present

## 2024-07-03 DIAGNOSIS — Z23 Encounter for immunization: Secondary | ICD-10-CM | POA: Diagnosis not present

## 2024-07-03 DIAGNOSIS — I10 Essential (primary) hypertension: Secondary | ICD-10-CM

## 2024-07-03 LAB — CBC WITH DIFFERENTIAL/PLATELET
Basophils Absolute: 0 K/uL (ref 0.0–0.1)
Basophils Relative: 0.5 % (ref 0.0–3.0)
Eosinophils Absolute: 0.3 K/uL (ref 0.0–0.7)
Eosinophils Relative: 4 % (ref 0.0–5.0)
HCT: 40 % (ref 36.0–46.0)
Hemoglobin: 13.3 g/dL (ref 12.0–15.0)
Lymphocytes Relative: 28.1 % (ref 12.0–46.0)
Lymphs Abs: 1.9 K/uL (ref 0.7–4.0)
MCHC: 33.4 g/dL (ref 30.0–36.0)
MCV: 93.3 fl (ref 78.0–100.0)
Monocytes Absolute: 0.6 K/uL (ref 0.1–1.0)
Monocytes Relative: 8.8 % (ref 3.0–12.0)
Neutro Abs: 4 K/uL (ref 1.4–7.7)
Neutrophils Relative %: 58.6 % (ref 43.0–77.0)
Platelets: 229 K/uL (ref 150.0–400.0)
RBC: 4.28 Mil/uL (ref 3.87–5.11)
RDW: 13 % (ref 11.5–15.5)
WBC: 6.7 K/uL (ref 4.0–10.5)

## 2024-07-03 LAB — URINALYSIS, ROUTINE W REFLEX MICROSCOPIC
Bilirubin Urine: NEGATIVE
Hgb urine dipstick: NEGATIVE
Ketones, ur: NEGATIVE
Nitrite: NEGATIVE
RBC / HPF: NONE SEEN (ref 0–?)
Specific Gravity, Urine: 1.01 (ref 1.000–1.030)
Total Protein, Urine: NEGATIVE
Urine Glucose: NEGATIVE
Urobilinogen, UA: 0.2 (ref 0.0–1.0)
pH: 7.5 (ref 5.0–8.0)

## 2024-07-03 LAB — COMPREHENSIVE METABOLIC PANEL WITH GFR
ALT: 31 U/L (ref 0–35)
AST: 30 U/L (ref 0–37)
Albumin: 4.5 g/dL (ref 3.5–5.2)
Alkaline Phosphatase: 61 U/L (ref 39–117)
BUN: 14 mg/dL (ref 6–23)
CO2: 31 meq/L (ref 19–32)
Calcium: 9.7 mg/dL (ref 8.4–10.5)
Chloride: 100 meq/L (ref 96–112)
Creatinine, Ser: 0.73 mg/dL (ref 0.40–1.20)
GFR: 79.07 mL/min (ref >60.00)
Glucose, Bld: 108 mg/dL — ABNORMAL HIGH (ref 70–99)
Potassium: 4.7 meq/L (ref 3.5–5.1)
Sodium: 140 meq/L (ref 135–145)
Total Bilirubin: 0.8 mg/dL (ref 0.2–1.2)
Total Protein: 7.3 g/dL (ref 6.0–8.3)

## 2024-07-03 LAB — HEMOGLOBIN A1C: Hgb A1c MFr Bld: 5.8 % (ref 4.6–6.5)

## 2024-07-03 LAB — T4, FREE: Free T4: 1.32 ng/dL (ref 0.60–1.60)

## 2024-07-03 NOTE — Assessment & Plan Note (Signed)
Continue w/Xanax prn rare use  Potential benefits of a long term opioids use as well as potential risks (i.e. addiction risk, apnea etc) and complications (i.e. Somnolence, constipation and others) were explained to the patient and were aknowledged. 

## 2024-07-03 NOTE — Addendum Note (Signed)
 Addended by: LEAR, Julieann Drummonds P on: 07/03/2024 01:34 PM   Modules accepted: Orders

## 2024-07-03 NOTE — Assessment & Plan Note (Signed)
Low dose Lasix prn Cont w/Irbesartan , Amlodipine 5 mg

## 2024-07-03 NOTE — Assessment & Plan Note (Signed)
 L flank pain, urinary frequency - every 2 hrs  x 3-4 wks. Pt saw GYN last week Will check UA

## 2024-07-03 NOTE — Assessment & Plan Note (Signed)
 Check A1c.

## 2024-07-03 NOTE — Progress Notes (Signed)
 Subjective:  Patient ID: Alexandra Scott, female    DOB: [birth year]  Age: 78 y.o. MRN: [id number]  CC: No chief complaint on file.   HPI Alexandra Scott presents for L flank pain, urinary frequency - every 2 hrs  x 3-4 wks. Pt saw GYN last week  Outpatient Medications Prior to Visit  Medication Sig Dispense Refill   amLODipine  (NORVASC ) 2.5 MG tablet Take 1 tablet (2.5 mg total) by mouth daily. 90 tablet 3   aspirin  81 MG EC tablet Take 81 mg by mouth at bedtime.     BIOTIN PO Take 1 tablet by mouth at bedtime.     Calcium Carbonate-Vitamin D3 600-400 MG-UNIT TABS Take 1 tablet by mouth 2 (two) times daily.      Cholecalciferol (VITAMIN D3) 50 MCG (2000 UT) capsule Take 1 capsule (2,000 Units total) by mouth daily. 100 capsule 3   ciclopirox  (PENLAC ) 8 % solution APPLY 1 APPLICATION OF SOLUTION TOPICALLY AT BEDTIME. APPLY OVER NAIL AND SURROUNDING SKIN DAILY 7 mL 0   furosemide  (LASIX ) 20 MG tablet Take 1-2 tablets (20-40 mg total) by mouth daily as needed. 30 tablet 3   irbesartan  (AVAPRO ) 300 MG tablet Take 1 tablet (300 mg total) by mouth daily. 90 tablet 3   Magnesium 250 MG TABS Take 250 mg by mouth daily.      metroNIDAZOLE  (FLAGYL ) 500 MG tablet Take 1 tablet (500 mg total) by mouth 3 (three) times daily. 30 tablet 0   Multiple Vitamins-Minerals (TH COMPLETE MULTI 50+ PO) Take by mouth.     Omega-3 Fatty Acids (FISH OIL) 1000 MG CAPS Take 1,000 mg by mouth 2 (two) times daily. Reported on 01/11/2016     Polyethyl Glycol-Propyl Glycol (SYSTANE OP) Place 1 drop into both eyes 2 (two) times daily as needed (dry eyes).     Probiotic Product (ALIGN) 4 MG CAPS Take 1 capsule (4 mg total) by mouth daily. 30 capsule 1   triamcinolone  cream (KENALOG ) 0.5 % Apply 1 Application topically 4 (four) times daily. 90 g 1   Turmeric 500 MG TABS Take 500 mg by mouth 2 (two) times daily.      vitamin C (ASCORBIC ACID) 500 MG tablet Take 500 mg by mouth at bedtime.      No facility-administered  medications prior to visit.    ROS: Review of Systems  Constitutional:  Negative for activity change, appetite change, chills, fatigue and unexpected weight change.  HENT:  Negative for congestion, mouth sores and sinus pressure.   Eyes:  Negative for visual disturbance.  Respiratory:  Negative for cough and chest tightness.   Gastrointestinal:  Negative for abdominal pain and nausea.  Genitourinary:  Positive for frequency. Negative for difficulty urinating, urgency and vaginal pain.  Musculoskeletal:  Positive for arthralgias and back pain. Negative for gait problem.  Skin:  Negative for pallor and rash.  Neurological:  Negative for dizziness, tremors, weakness, numbness and headaches.  Psychiatric/Behavioral:  Negative for confusion and sleep disturbance.     Objective:  BP (!) 152/88   Pulse 78   Temp 98.1 F (36.7 C) (Temporal)   Ht 5' (1.524 m)   Wt 161 lb 6 oz (73.2 kg)   SpO2 98%   BMI 31.52 kg/m   BP Readings from Last 3 Encounters:  07/03/24 (!) 152/88  04/01/24 122/70  11/29/23 118/72    Wt Readings from Last 3 Encounters:  07/03/24 161 lb 6 oz (73.2 kg)  04/01/24 159 lb (  72.1 kg)  11/29/23 168 lb (76.2 kg)    Physical Exam Constitutional:      General: She is not in acute distress.    Appearance: She is well-developed. She is obese.  HENT:     Head: Normocephalic.     Right Ear: External ear normal.     Left Ear: External ear normal.     Nose: Nose normal.  Eyes:     General:        Right eye: No discharge.        Left eye: No discharge.     Conjunctiva/sclera: Conjunctivae normal.     Pupils: Pupils are equal, round, and reactive to light.  Neck:     Thyroid : No thyromegaly.     Vascular: No JVD.     Trachea: No tracheal deviation.  Cardiovascular:     Rate and Rhythm: Normal rate and regular rhythm.     Heart sounds: Normal heart sounds.  Pulmonary:     Effort: No respiratory distress.     Breath sounds: No stridor. No wheezing.   Abdominal:     General: Bowel sounds are normal. There is no distension.     Palpations: Abdomen is soft. There is no mass.     Tenderness: There is no abdominal tenderness. There is no guarding or rebound.  Musculoskeletal:        General: Tenderness present.     Cervical back: Normal range of motion and neck supple. No rigidity.  Lymphadenopathy:     Cervical: No cervical adenopathy.  Skin:    Findings: No erythema or rash.  Neurological:     Mental Status: She is oriented to person, place, and time.     Cranial Nerves: No cranial nerve deficit.     Motor: No abnormal muscle tone.     Coordination: Coordination normal.     Deep Tendon Reflexes: Reflexes normal.  Psychiatric:        Behavior: Behavior normal.        Thought Content: Thought content normal.        Judgment: Judgment normal.   Chronic LS spine, knee pain w/ROM  Lab Results  Component Value Date   WBC 7.5 07/12/2023   HGB 13.6 07/12/2023   HCT 41.1 07/12/2023   PLT 252.0 07/12/2023   GLUCOSE 112 (H) 11/29/2023   CHOL 147 02/07/2018   TRIG 70.0 02/07/2018   HDL 44.30 02/07/2018   LDLDIRECT 150.6 10/15/2007   LDLCALC 89 02/07/2018   ALT 70 (H) 11/29/2023   AST 66 (H) 11/29/2023   NA 135 11/29/2023   K 4.3 11/29/2023   CL 98 11/29/2023   CREATININE 0.76 11/29/2023   BUN 15 11/29/2023   CO2 31 11/29/2023   TSH 2.73 02/28/2022   INR 1.0 02/23/2021   HGBA1C 5.8 11/29/2023    US  Abdomen Limited RUQ (LIVER/GB) Result Date: 09/14/2021 CLINICAL DATA:  Elevated liver function tests.  Thanks EXAM: ULTRASOUND ABDOMEN LIMITED RIGHT UPPER QUADRANT COMPARISON:  CT 01/23/2017 FINDINGS: Gallbladder: No gallstones or wall thickening visualized. No sonographic Murphy sign noted by sonographer. Common bile duct: Diameter: 2.8 mm Liver: Diffusely increased liver echogenicity. Portal vein is patent on color Doppler imaging with normal direction of blood flow towards the liver. Other: None. IMPRESSION: Hepatic steatosis.  Unremarkable gallbladder and biliary ducts. Electronically Signed   By: Jacob  Kahn M.D.   On: 09/14/2021 15:35    Assessment & Plan:   Problem List Items Addressed This Visit  Anxiety disorder - Primary   Continue w/Xanax  prn rare use  Potential benefits of a long term opioids use as well as potential risks (i.e. addiction risk, apnea etc) and complications (i.e. Somnolence, constipation and others) were explained to the patient and were aknowledged.      Relevant Orders   CBC with Differential/Platelet   Hemoglobin A1c   T4, free   Essential hypertension   Low dose Lasix  prn Cont w/Irbesartan  , Amlodipine  5 mg      Hyperglycemia   Check A1c      Relevant Orders   CBC with Differential/Platelet   Comprehensive metabolic panel with GFR   Hemoglobin A1c   Urinary frequency   L flank pain, urinary frequency - every 2 hrs  x 3-4 wks. Pt saw GYN last week Will check UA      Relevant Orders   CBC with Differential/Platelet   Urinalysis      No orders of the defined types were placed in this encounter.     Follow-up: Return in about 6 months (around 01/01/2025) for a follow-up visit.  Marolyn Noel, MD

## 2024-07-04 ENCOUNTER — Ambulatory Visit: Payer: Self-pay | Admitting: Internal Medicine

## 2024-09-12 ENCOUNTER — Telehealth: Payer: Self-pay

## 2024-09-12 NOTE — Telephone Encounter (Signed)
 Copied from CRM [id number]. Topic: Clinical - Medical Advice >> Sep 12, 2024 10:43 AM Ashley R wrote: Reason for CRM: Pt has dry hacking cough. No fever or aches. Requesting anything can be recommended to help, offer next acute appt on Monday but declined.    ----------------------------------------------------------------------- From previous Reason for Contact - Scheduling: Patient/patient representative is calling to schedule an appointment. Refer to attachments for appointment information.

## 2024-09-12 NOTE — Telephone Encounter (Signed)
 You CoricidinHBP multisymptom relief, decongestants, cough suppressants or Delsym for cough Office visit problems Thanks

## 2024-09-13 ENCOUNTER — Ambulatory Visit (INDEPENDENT_AMBULATORY_CARE_PROVIDER_SITE_OTHER)

## 2024-09-13 ENCOUNTER — Ambulatory Visit
Admission: EM | Admit: 2024-09-13 | Discharge: 2024-09-13 | Disposition: A | Discharge status: Home / Self Care | Attending: Family Medicine | Admitting: Family Medicine

## 2024-09-13 ENCOUNTER — Ambulatory Visit: Payer: Self-pay

## 2024-09-13 ENCOUNTER — Encounter: Payer: Self-pay | Admitting: Emergency Medicine

## 2024-09-13 DIAGNOSIS — J209 Acute bronchitis, unspecified: Secondary | ICD-10-CM

## 2024-09-13 LAB — POC COVID19/FLU A&B COMBO
Covid Antigen, POC: NEGATIVE
Influenza A Antigen, POC: NEGATIVE
Influenza B Antigen, POC: NEGATIVE

## 2024-09-13 MED ORDER — AZITHROMYCIN 250 MG PO TABS: 250.0000 mg | ORAL_TABLET | Freq: Every day | ORAL | 0 refills | Status: AC

## 2024-09-13 MED ORDER — METHYLPREDNISOLONE 4 MG PO TBPK: ORAL_TABLET | ORAL | 0 refills | Status: AC

## 2024-09-13 MED ORDER — GUAIFENESIN-DM 100-10 MG/5ML PO SYRP: 5.0000 mL | ORAL_SOLUTION | ORAL | 0 refills | Status: AC | PRN

## 2024-09-13 NOTE — ED Triage Notes (Signed)
 Pt presents c/o URI x 21 days. Pt states, This started the first week of December with sneezing. The second week it was a clear runny nose with sneezing. This week has been a hacky cough, and the sneeze, and the runny nose. It feels like I need to cough something up but nothing is coming up. Pt denies emesis and diarrhea.

## 2024-09-13 NOTE — Telephone Encounter (Signed)
 FYI Only or Action Required?: FYI only for provider: urgent care advised.  Patient was last seen in primary care on 07/03/2024 by Plotnikov, Karlynn GAILS, MD.  Called Nurse Triage reporting Cough and Nasal Congestion.  Symptoms began several weeks ago.  Interventions attempted: OTC medications: Halls cough drops.  Symptoms are: gradually worsening.  Triage Disposition: See Physician Within 24 Hours  Patient/caregiver understands and will follow disposition?: Yes              Copied from CRM #[id number]. Topic: General - Other >> Sep 13, 2024  9:26 AM Tiffini S wrote: Reason for CRM: Started about two weeks ago- runny nose (clear),  congestion, sneezing and last night with severe hard cough- could not catch her breath with wheezing/ concerned about fluid in lungs Reason for Disposition  [1] Continuous (nonstop) coughing interferes with work or school AND [2] no improvement using cough treatment per Care Advice  Answer Assessment - Initial Assessment Questions Patient states symptoms started several weeks ago with sneezing. She states the last time she had these symptoms she was sick in January 2020 and she thinks it was COVID but she ended up hospitalized eventually from lung infection. Patient states for the last week she had the sneezing and clear runny nose but as of Sunday that resolved and turned to a dry hacking cough. She states yesterday the symptoms all returned and worsened. She was calling in today to follow up with her call/request yesterday for what OTC medication PCP recommends. Read message from PCP and advised patient to go to urgent care.   1. ONSET: When did the cough begin?      Sunday.  2. SEVERITY: How bad is the cough today?      Severe. Hacking, rattling cough. She states last night she wasn't able to lie flat and had to sleep sitting up in a chair.  3. SPUTUM: Describe the color of your sputum (e.g., none, dry cough; clear, white, yellow, green)      Dry.  4. HEMOPTYSIS: Are you coughing up any blood? If Yes, ask: How much? (e.g., flecks, streaks, tablespoons, etc.)     No.  5. DIFFICULTY BREATHING: Are you having difficulty breathing? If Yes, ask: How bad is it? (e.g., mild, moderate, severe)      Not currently, she states last night it felt like she couldn't catch her breath from the constant coughing.  6. FEVER: Do you have a fever? If Yes, ask: What is your temperature, how was it measured, and when did it start?     No.  7. CARDIAC HISTORY: Do you have any history of heart disease? (e.g., heart attack, congestive heart failure)      HTN.  8. LUNG HISTORY: Do you have any history of lung disease?  (e.g., pulmonary embolus, asthma, emphysema)     No per chart.  9. PE RISK FACTORS: Do you have a history of blood clots? (or: recent major surgery, recent prolonged travel, bedridden)     No per chart.  10. OTHER SYMPTOMS: Do you have any other symptoms? (e.g., runny nose, wheezing, chest pain)       Nasal congestion, runny nose with clear discharge, sneezing, slight sore throat. No body aches.  11. PREGNANCY: Is there any chance you are pregnant? When was your last menstrual period?       N/A.  12. TRAVEL: Have you traveled out of the country in the last month? (e.g., travel history, exposures)  No exposure.  Protocols used: Cough - Acute Non-Productive-A-AH

## 2024-09-13 NOTE — ED Provider Notes (Signed)
 " [facility]    CSN: [id number] Arrival date & time: 09/13/24  1136      History   Chief Complaint Chief Complaint  Patient presents with   Cough    HPI Alexandra Scott is a 78 y.o. female.  Patient with past history significant for hypertension, GERD, osteoarthritis presents to the urgent care today with concerns of a cough.  Reports about 3 weeks of nonproductive cough.  No other associated symptoms such as chills, body aches, productive cough shortness of breath, or chest pain.  No medications attempted prior to arriving.   Cough   Past Medical History:  Diagnosis Date   Breast cyst 09/26/2006   Left / SER   Colon polyps    Constipation    GERD (gastroesophageal reflux disease)    uses OTC   LBP (low back pain)    ? Spinal stenosis   Osteoarthritis    Osteoarthritis of hip 09/26/2009   Right/Dr Fields    Patient Active Problem List   Diagnosis Date Noted   Urinary frequency 07/03/2024   Need for influenza vaccination 07/12/2023   Muscle weakness 08/15/2022   Fatty liver 06/08/2022   Elevated LFTs 06/07/2022   Hyperglycemia 06/07/2022   Degenerative lumbar spinal stenosis 02/28/2022   Endometrial disorder 02/28/2022   Obesity (BMI 30-39.9) 02/28/2022   Osteopenia determined by x-ray 02/28/2022   Pain of breast 02/28/2022   Pelvic mass 02/28/2022   Idiopathic scoliosis and kyphoscoliosis 02/28/2022   Scoliosis deformity of spine 02/28/2022   Spondylolisthesis of lumbar region 02/28/2022   Elevated liver enzymes 08/29/2021   Onychomycosis 08/27/2021   Meteorism 11/23/2020   Ingrowing nail, left great toe 08/24/2020   Colon polyps 08/24/2020   Lumbar radiculopathy 07/09/2020   DOE (dyspnea on exertion) 05/21/2020   Paresthesia 01/23/2020   Nose dryness 12/12/2019   Anxiety disorder 08/14/2019   Wart viral 05/22/2019   Rash and nonspecific skin eruption 04/10/2019   Edema 04/10/2019   Alopecia 01/09/2019   Hair loss 12/06/2018   Dry  mouth 11/27/2018   Ataxia 11/20/2018   Dizzinesses 11/20/2018   Leg weakness, bilateral 11/20/2018   Shingles 10/17/2018   Hyponatremia 10/08/2018   Influenza B 10/08/2018   Osteoarthritis of right knee 09/13/2018   Trochanteric bursitis of right hip 10/10/2017   Callus of foot 03/07/2017   Leg pain, lateral, right 02/06/2017   Stress at home 02/06/2017   Abdominal pain in female 01/10/2017   Osteoporosis, post-menopausal 01/10/2017   Sore in nose 01/10/2017   GERD (gastroesophageal reflux disease) 01/10/2017   Cystitis 01/13/2016   Cough 01/11/2016   Acute upper respiratory infection 09/25/2015   Lightheadedness 06/30/2015   Discharge from right nipple 05/06/2014   Right shoulder pain 11/22/2013   Fatigue 11/22/2013   Cellulitis and abscess of buttock 01/31/2013   Actinic keratoses 07/01/2012   Well adult exam 05/18/2012   Anemia 05/18/2012   Neoplasm of uncertain behavior of skin 05/18/2012   History of total right hip replacement 09/26/2010   Osteoarthritis 08/18/2010   DEGENERATIVE JOINT DISEASE, RIGHT HIP 07/01/2010   HIP PAIN 04/21/2010   LOW BACK PAIN 02/05/2010   CONSTIPATION 10/24/2008   RLQ PAIN 10/24/2008   Essential hypertension 10/24/2007   BREAST CYST 10/24/2007   FOOT PAIN 10/24/2007   History of colonic polyps 10/24/2007    Past Surgical History:  Procedure Laterality Date   BREAST DUCTAL SYSTEM EXCISION Left 05/22/2014   Procedure: LEFT NIPPLE DUCT EXCISION;  Surgeon: Donnice POUR. Tsuei,  MD;  Location: [facility] OR;  Service: General;  Laterality: Left;   BREAST DUCTAL SYSTEM EXCISION Right 03/30/2021   Procedure: RIGHT NIPPLE DUCT EXCISION;  Surgeon: Belinda Cough, MD;  Location: [facility];  Service: General;  Laterality: Right;   COLONOSCOPY     COLONOSCOPY W/ BIOPSIES AND POLYPECTOMY     JOINT REPLACEMENT  7/12   R THR   POLYPECTOMY      OB History   No obstetric history on file.      Home Medications    Prior to Admission  medications  Medication Sig Start Date End Date Taking? Authorizing Provider  azithromycin  (ZITHROMAX ) 250 MG tablet Take 1 tablet (250 mg total) by mouth daily. Take first 2 tablets together, then 1 every day until finished. 09/13/24  Yes Ciearra Rufo A, PA-C  guaiFENesin-dextromethorphan (ROBITUSSIN DM) 100-10 MG/5ML syrup Take 5 mLs by mouth every 4 (four) hours as needed for cough. 09/13/24  Yes Walt Geathers A, PA-C  methylPREDNISolone  (MEDROL  DOSEPAK) 4 MG TBPK tablet Take 6 tablets (24 mg total) by mouth daily for 1 day, THEN 5 tablets (20 mg total) daily for 1 day, THEN 4 tablets (16 mg total) daily for 1 day, THEN 3 tablets (12 mg total) daily for 1 day, THEN 2 tablets (8 mg total) daily for 1 day, THEN 1 tablet (4 mg total) daily for 1 day. 09/13/24 09/19/24 Yes Sevin Farone A, PA-C  amLODipine  (NORVASC ) 2.5 MG tablet Take 1 tablet (2.5 mg total) by mouth daily. 04/01/24 04/01/25  Plotnikov, Karlynn GAILS, MD  aspirin  81 MG EC tablet Take 81 mg by mouth at bedtime.    [provider]  BIOTIN PO Take 1 tablet by mouth at bedtime.    [provider]  Calcium Carbonate-Vitamin D3 600-400 MG-UNIT TABS Take 1 tablet by mouth 2 (two) times daily.     [provider]  Cholecalciferol (VITAMIN D3) 50 MCG (2000 UT) capsule Take 1 capsule (2,000 Units total) by mouth daily. 04/26/20   Plotnikov, Karlynn GAILS, MD  ciclopirox  (PENLAC ) 8 % solution APPLY 1 APPLICATION OF SOLUTION TOPICALLY AT BEDTIME. APPLY OVER NAIL AND SURROUNDING SKIN DAILY 06/06/24   Plotnikov, Aleksei V, MD  furosemide  (LASIX ) 20 MG tablet Take 1-2 tablets (20-40 mg total) by mouth daily as needed. 02/28/22   Plotnikov, Aleksei V, MD  irbesartan  (AVAPRO ) 300 MG tablet Take 1 tablet (300 mg total) by mouth daily. 04/01/24   Plotnikov, Aleksei V, MD  Magnesium 250 MG TABS Take 250 mg by mouth daily.     [provider]  metroNIDAZOLE  (FLAGYL ) 500 MG tablet Take 1 tablet (500 mg total) by mouth 3 (three) times daily.  12/06/22   Plotnikov, Aleksei V, MD  Multiple Vitamins-Minerals (TH COMPLETE MULTI 50+ PO) Take by mouth.    [provider]  Omega-3 Fatty Acids (FISH OIL) 1000 MG CAPS Take 1,000 mg by mouth 2 (two) times daily. Reported on 01/11/2016    [provider]  Polyethyl Glycol-Propyl Glycol (SYSTANE OP) Place 1 drop into both eyes 2 (two) times daily as needed (dry eyes).    [provider]  Probiotic Product (ALIGN) 4 MG CAPS Take 1 capsule (4 mg total) by mouth daily. 12/06/22   Plotnikov, Aleksei V, MD  triamcinolone  cream (KENALOG ) 0.5 % Apply 1 Application topically 4 (four) times daily. 04/01/24 04/01/25  Plotnikov, Aleksei V, MD  Turmeric 500 MG TABS Take 500 mg by mouth 2 (two) times daily.  [provider]  vitamin C (ASCORBIC ACID) 500 MG tablet Take 500 mg by mouth at bedtime.     [provider]    Family History Family History  Problem Relation Age of Onset   Colon cancer Maternal Uncle    Hypertension Other    Breast cancer Other    Esophageal cancer Neg Hx    Rectal cancer Neg Hx    Stomach cancer Neg Hx     Social History Social History[1]   Allergies   Augmentin  [amoxicillin -pot clavulanate], Cortisone, Hydrocortisone, Maxzide [hydrochlorothiazide-triamterene ], and Prednisone   Review of Systems Review of Systems  Respiratory:  Positive for cough.   All other systems reviewed and are negative.    Physical Exam Triage Vital Signs ED Triage Vitals  Encounter Vitals Group     BP 09/13/24 1239 (!) 158/72     Girls Systolic BP Percentile --      Girls Diastolic BP Percentile --      Boys Systolic BP Percentile --      Boys Diastolic BP Percentile --      Pulse Rate 09/13/24 1239 98     Resp 09/13/24 1239 18     Temp 09/13/24 1239 99.1 F (37.3 C)     Temp Source 09/13/24 1239 Oral     SpO2 09/13/24 1239 96 %     Weight 09/13/24 1239 161 lb 6 oz (73.2 kg)     Height --      Head Circumference --      Peak Flow --       Pain Score 09/13/24 1238 0     Pain Loc --      Pain Education --      Exclude from Growth Chart --    No data found.  Updated Vital Signs BP (!) 158/72 (BP Location: Left Arm)   Pulse 98   Temp 99.1 F (37.3 C) (Oral)   Resp 18   Wt 161 lb 6 oz (73.2 kg)   SpO2 96%   BMI 31.52 kg/m   Visual Acuity Right Eye Distance:   Left Eye Distance:   Bilateral Distance:    Right Eye Near:   Left Eye Near:    Bilateral Near:     Physical Exam Vitals and nursing note reviewed.  Constitutional:      General: She is not in acute distress.    Appearance: She is well-developed.  HENT:     Head: Normocephalic and atraumatic.  Eyes:     Conjunctiva/sclera: Conjunctivae normal.  Cardiovascular:     Rate and Rhythm: Normal rate and regular rhythm.     Heart sounds: No murmur heard. Pulmonary:     Effort: Pulmonary effort is normal. No respiratory distress.     Breath sounds: Normal breath sounds. No wheezing, rhonchi or rales.  Abdominal:     Palpations: Abdomen is soft.     Tenderness: There is no abdominal tenderness.  Musculoskeletal:        General: No swelling.     Cervical back: Neck supple.  Skin:    General: Skin is warm and dry.     Capillary Refill: Capillary refill takes less than 2 seconds.  Neurological:     Mental Status: She is alert.  Psychiatric:        Mood and Affect: Mood normal.      UC Treatments / Results  Labs (all labs ordered are listed, but only abnormal results are displayed) Labs Reviewed  POC COVID19/FLU A&B COMBO - Normal    EKG   Radiology No results found.  Procedures Procedures (including critical care time)  Medications Ordered in UC Medications - No data to display  Initial Impression / Assessment and Plan / UC Course  I have reviewed the triage vital signs and the nursing notes.  Pertinent labs & imaging results that were available during my care of the patient were reviewed by me and considered in my medical  decision making (see chart for details).     This patient presents to the UC for concern of cough.  Differential diagnosis includes viral URI, COVID-19, influenza, pneumonia, bronchitis    Additional history obtained:  Additional history obtained from chart review   Lab Tests:  I Ordered, and personally interpreted labs.  The pertinent results include: COVID-19 negative, influenza A/B negative   Imaging Studies ordered:  I ordered imaging studies including chest x-ray I independently visualized and interpreted imaging which showed no obvious findings on my interpretation but pending formal radiology read I agree with the radiologist interpretation   Problem List / UC Course:  Patient with past history significant for hypertension, GERD, osteoarthritis presents urgent care with concerns of a cough.  Reports cough ongoing for about 3 weeks.  States cough has been nonproductive and denies any chest pain or shortness of breath.  No reported sick contacts recently. On exam, there is no abnormal heart or lung sounds.  No wheezing, rales, or rhonchi.  Vitals unremarkable at this time.  Reports that she had some coffee prior to having her temperature measured in triage today so she is unsure if this is maybe like temperature slightly elevated. Given reassuring exam and negative viral testing, chest x-ray still pending but no obvious signs of focal pneumonia.  Suspect with prolonged symptoms with lack of productive cough or fever, more likely that this is bronchitis.  Will start patient on a course of cough medication, antibiotics, and steroids.  Patient does have a noted allergy history of steroids but she reports that this is due to feeling anxious with prednisone and can otherwise tolerate this.  Advised that we will start the cough medication antibiotics and she can delay the  steroid course until finishing the antibiotics as she may have improvement/resolution without the steroids.  Patient  verbalized understanding and agreement will follow-up closely PCP.  Discharged home in stable condition.   Social Determinants of Health:  None  Final Clinical Impressions(s) / UC Diagnoses   Final diagnoses:  Acute bronchitis, unspecified organism     Discharge Instructions      You were seen at urgent care today for concerns of a cough.  You were negative for COVID-19, influenza, and your chest x-ray does not appear to show any obvious signs of pneumonia.  I suspect likely a bronchitis going on causing his current symptoms.  I have started you on a course of cough medication, antibiotics, and steroids.  You have a history of sensitivity to steroids so you can hold off on starting the steroids until you finish your antibiotics and the cough medication if your cough is not improving.  I would recommend following closely with your primary care provider if your cough is not improved after the cough medications and antibiotics.  For any concerns or worsening symptoms, return to urgent care or seek evaluation in the emergency department.     ED Prescriptions     Medication Sig Dispense Auth. Provider   guaiFENesin-dextromethorphan (ROBITUSSIN DM) 100-10 MG/5ML  syrup Take 5 mLs by mouth every 4 (four) hours as needed for cough. 118 mL Minetta Krisher A, PA-C   methylPREDNISolone  (MEDROL  DOSEPAK) 4 MG TBPK tablet Take 6 tablets (24 mg total) by mouth daily for 1 day, THEN 5 tablets (20 mg total) daily for 1 day, THEN 4 tablets (16 mg total) daily for 1 day, THEN 3 tablets (12 mg total) daily for 1 day, THEN 2 tablets (8 mg total) daily for 1 day, THEN 1 tablet (4 mg total) daily for 1 day. 21 tablet Bjorn Hallas A, PA-C   azithromycin  (ZITHROMAX ) 250 MG tablet Take 1 tablet (250 mg total) by mouth daily. Take first 2 tablets together, then 1 every day until finished. 6 tablet Saroya Riccobono A, PA-C      PDMP not reviewed this encounter.    [1]  Social History Tobacco Use   Smoking  status: Never    Passive exposure: Never   Smokeless tobacco: Never  Vaping Use   Vaping status: Never Used  Substance Use Topics   Alcohol  use: No    Alcohol /week: 0.0 standard drinks of alcohol    Drug use: No     Dre Gamino A, PA-C 09/13/24 1437  "

## 2024-09-13 NOTE — Telephone Encounter (Signed)
 Patient called in for worsening cough. Read PCP's note to patient:  You CoricidinHBP multisymptom relief, decongestants, cough suppressants or Delsym for cough Office visit problems Thanks  She is aware and due to worsening symptoms will be going to urgent care today.

## 2024-09-13 NOTE — Discharge Instructions (Addendum)
 You were seen at urgent care today for concerns of a cough.  You were negative for COVID-19, influenza, and your chest x-ray does not appear to show any obvious signs of pneumonia.  I suspect likely a bronchitis going on causing his current symptoms.  I have started you on a course of cough medication, antibiotics, and steroids.  You have a history of sensitivity to steroids so you can hold off on starting the steroids until you finish your antibiotics and the cough medication if your cough is not improving.  I would recommend following closely with your primary care provider if your cough is not improved after the cough medications and antibiotics.  For any concerns or worsening symptoms, return to urgent care or seek evaluation in the emergency department.

## 2024-11-27 ENCOUNTER — Ambulatory Visit

## 2025-01-01 ENCOUNTER — Ambulatory Visit: Admitting: Internal Medicine
